# Patient Record
Sex: Female | Born: 1935
Health system: Southern US, Community
[De-identification: ages and names within clinical notes are randomized; demographics above are authoritative.]

## PROBLEM LIST (undated history)

## (undated) DIAGNOSIS — N183 Chronic kidney disease, stage 3 unspecified: Secondary | ICD-10-CM

## (undated) DIAGNOSIS — R011 Cardiac murmur, unspecified: Secondary | ICD-10-CM

## (undated) DIAGNOSIS — M109 Gout, unspecified: Secondary | ICD-10-CM

## (undated) DIAGNOSIS — I219 Acute myocardial infarction, unspecified: Secondary | ICD-10-CM

## (undated) DIAGNOSIS — I10 Essential (primary) hypertension: Secondary | ICD-10-CM

## (undated) DIAGNOSIS — R7303 Prediabetes: Secondary | ICD-10-CM

## (undated) DIAGNOSIS — I779 Disorder of arteries and arterioles, unspecified: Secondary | ICD-10-CM

## (undated) DIAGNOSIS — I2510 Atherosclerotic heart disease of native coronary artery without angina pectoris: Secondary | ICD-10-CM

## (undated) DIAGNOSIS — N390 Urinary tract infection, site not specified: Secondary | ICD-10-CM

## (undated) DIAGNOSIS — M3320 Polymyositis, organ involvement unspecified: Secondary | ICD-10-CM

## (undated) DIAGNOSIS — I739 Peripheral vascular disease, unspecified: Secondary | ICD-10-CM

## (undated) DIAGNOSIS — I350 Nonrheumatic aortic (valve) stenosis: Secondary | ICD-10-CM

## (undated) DIAGNOSIS — E785 Hyperlipidemia, unspecified: Secondary | ICD-10-CM

## (undated) DIAGNOSIS — R413 Other amnesia: Secondary | ICD-10-CM

## (undated) DIAGNOSIS — R911 Solitary pulmonary nodule: Secondary | ICD-10-CM

## (undated) DIAGNOSIS — I609 Nontraumatic subarachnoid hemorrhage, unspecified: Secondary | ICD-10-CM

## (undated) DIAGNOSIS — C50919 Malignant neoplasm of unspecified site of unspecified female breast: Secondary | ICD-10-CM

## (undated) HISTORY — PX: MASTECTOMY, RADICAL: SHX710

## (undated) HISTORY — DX: Malignant neoplasm of unspecified site of unspecified female breast: C50.919

## (undated) HISTORY — PX: CATARACT EXTRACTION: SUR2

## (undated) HISTORY — DX: Gout, unspecified: M10.9

## (undated) HISTORY — DX: Cardiac murmur, unspecified: R01.1

## (undated) HISTORY — DX: Urinary tract infection, site not specified: N39.0

## (undated) HISTORY — DX: Acute myocardial infarction, unspecified: I21.9

## (undated) HISTORY — PX: ABDOMINAL HYSTERECTOMY: SHX81

## (undated) HISTORY — PX: APPENDECTOMY: SHX54

## (undated) HISTORY — DX: Hyperlipidemia, unspecified: E78.5

## (undated) HISTORY — DX: Polymyositis, organ involvement unspecified: M33.20

---

## 2011-08-01 DIAGNOSIS — M069 Rheumatoid arthritis, unspecified: Secondary | ICD-10-CM | POA: Diagnosis not present

## 2011-09-14 DIAGNOSIS — M810 Age-related osteoporosis without current pathological fracture: Secondary | ICD-10-CM | POA: Diagnosis not present

## 2011-11-02 DIAGNOSIS — Z1231 Encounter for screening mammogram for malignant neoplasm of breast: Secondary | ICD-10-CM | POA: Diagnosis not present

## 2011-11-02 DIAGNOSIS — Z853 Personal history of malignant neoplasm of breast: Secondary | ICD-10-CM | POA: Diagnosis not present

## 2011-11-17 DIAGNOSIS — M109 Gout, unspecified: Secondary | ICD-10-CM | POA: Diagnosis not present

## 2011-11-17 DIAGNOSIS — E785 Hyperlipidemia, unspecified: Secondary | ICD-10-CM | POA: Diagnosis not present

## 2011-11-17 DIAGNOSIS — Z79899 Other long term (current) drug therapy: Secondary | ICD-10-CM | POA: Diagnosis not present

## 2011-11-17 DIAGNOSIS — I119 Hypertensive heart disease without heart failure: Secondary | ICD-10-CM | POA: Diagnosis not present

## 2011-11-17 DIAGNOSIS — N19 Unspecified kidney failure: Secondary | ICD-10-CM | POA: Diagnosis not present

## 2011-11-17 DIAGNOSIS — M3320 Polymyositis, organ involvement unspecified: Secondary | ICD-10-CM | POA: Diagnosis not present

## 2011-11-17 DIAGNOSIS — R945 Abnormal results of liver function studies: Secondary | ICD-10-CM | POA: Diagnosis not present

## 2011-11-17 DIAGNOSIS — D539 Nutritional anemia, unspecified: Secondary | ICD-10-CM | POA: Diagnosis not present

## 2011-11-28 DIAGNOSIS — F411 Generalized anxiety disorder: Secondary | ICD-10-CM | POA: Diagnosis not present

## 2011-11-28 DIAGNOSIS — Z7982 Long term (current) use of aspirin: Secondary | ICD-10-CM | POA: Diagnosis not present

## 2011-11-28 DIAGNOSIS — M3320 Polymyositis, organ involvement unspecified: Secondary | ICD-10-CM | POA: Diagnosis not present

## 2011-11-28 DIAGNOSIS — Z5181 Encounter for therapeutic drug level monitoring: Secondary | ICD-10-CM | POA: Diagnosis not present

## 2011-11-28 DIAGNOSIS — M109 Gout, unspecified: Secondary | ICD-10-CM | POA: Diagnosis not present

## 2011-11-28 DIAGNOSIS — R209 Unspecified disturbances of skin sensation: Secondary | ICD-10-CM | POA: Diagnosis not present

## 2011-11-28 DIAGNOSIS — I10 Essential (primary) hypertension: Secondary | ICD-10-CM | POA: Diagnosis not present

## 2011-11-28 DIAGNOSIS — Z79899 Other long term (current) drug therapy: Secondary | ICD-10-CM | POA: Diagnosis not present

## 2011-11-28 DIAGNOSIS — R0602 Shortness of breath: Secondary | ICD-10-CM | POA: Diagnosis not present

## 2011-11-28 DIAGNOSIS — R079 Chest pain, unspecified: Secondary | ICD-10-CM | POA: Diagnosis not present

## 2011-11-28 DIAGNOSIS — R0789 Other chest pain: Secondary | ICD-10-CM | POA: Diagnosis not present

## 2011-11-29 DIAGNOSIS — N19 Unspecified kidney failure: Secondary | ICD-10-CM | POA: Diagnosis not present

## 2011-11-29 DIAGNOSIS — I119 Hypertensive heart disease without heart failure: Secondary | ICD-10-CM | POA: Diagnosis not present

## 2011-11-29 DIAGNOSIS — R0789 Other chest pain: Secondary | ICD-10-CM | POA: Diagnosis not present

## 2011-11-29 DIAGNOSIS — R079 Chest pain, unspecified: Secondary | ICD-10-CM | POA: Diagnosis not present

## 2011-12-12 DIAGNOSIS — I119 Hypertensive heart disease without heart failure: Secondary | ICD-10-CM | POA: Diagnosis not present

## 2012-01-05 DIAGNOSIS — I2510 Atherosclerotic heart disease of native coronary artery without angina pectoris: Secondary | ICD-10-CM | POA: Diagnosis not present

## 2012-01-05 DIAGNOSIS — M109 Gout, unspecified: Secondary | ICD-10-CM | POA: Diagnosis not present

## 2012-01-05 DIAGNOSIS — M810 Age-related osteoporosis without current pathological fracture: Secondary | ICD-10-CM | POA: Diagnosis not present

## 2012-01-05 DIAGNOSIS — I10 Essential (primary) hypertension: Secondary | ICD-10-CM | POA: Diagnosis not present

## 2012-01-19 DIAGNOSIS — I10 Essential (primary) hypertension: Secondary | ICD-10-CM | POA: Diagnosis not present

## 2012-01-19 DIAGNOSIS — R413 Other amnesia: Secondary | ICD-10-CM | POA: Diagnosis not present

## 2012-02-08 DIAGNOSIS — M3320 Polymyositis, organ involvement unspecified: Secondary | ICD-10-CM | POA: Diagnosis not present

## 2012-02-22 DIAGNOSIS — M3320 Polymyositis, organ involvement unspecified: Secondary | ICD-10-CM | POA: Diagnosis not present

## 2012-02-22 DIAGNOSIS — M545 Low back pain: Secondary | ICD-10-CM | POA: Diagnosis not present

## 2012-02-22 DIAGNOSIS — M169 Osteoarthritis of hip, unspecified: Secondary | ICD-10-CM | POA: Diagnosis not present

## 2012-02-22 DIAGNOSIS — M5137 Other intervertebral disc degeneration, lumbosacral region: Secondary | ICD-10-CM | POA: Diagnosis not present

## 2012-02-22 DIAGNOSIS — M109 Gout, unspecified: Secondary | ICD-10-CM | POA: Diagnosis not present

## 2012-03-13 DIAGNOSIS — M5380 Other specified dorsopathies, site unspecified: Secondary | ICD-10-CM | POA: Diagnosis not present

## 2012-03-13 DIAGNOSIS — M545 Low back pain: Secondary | ICD-10-CM | POA: Diagnosis not present

## 2012-03-13 DIAGNOSIS — IMO0002 Reserved for concepts with insufficient information to code with codable children: Secondary | ICD-10-CM | POA: Diagnosis not present

## 2012-03-13 DIAGNOSIS — R293 Abnormal posture: Secondary | ICD-10-CM | POA: Diagnosis not present

## 2012-03-19 DIAGNOSIS — M5380 Other specified dorsopathies, site unspecified: Secondary | ICD-10-CM | POA: Diagnosis not present

## 2012-03-19 DIAGNOSIS — IMO0002 Reserved for concepts with insufficient information to code with codable children: Secondary | ICD-10-CM | POA: Diagnosis not present

## 2012-03-19 DIAGNOSIS — R293 Abnormal posture: Secondary | ICD-10-CM | POA: Diagnosis not present

## 2012-03-19 DIAGNOSIS — M545 Low back pain: Secondary | ICD-10-CM | POA: Diagnosis not present

## 2012-03-20 DIAGNOSIS — M810 Age-related osteoporosis without current pathological fracture: Secondary | ICD-10-CM | POA: Diagnosis not present

## 2012-03-23 DIAGNOSIS — M5380 Other specified dorsopathies, site unspecified: Secondary | ICD-10-CM | POA: Diagnosis not present

## 2012-03-23 DIAGNOSIS — R293 Abnormal posture: Secondary | ICD-10-CM | POA: Diagnosis not present

## 2012-03-23 DIAGNOSIS — IMO0002 Reserved for concepts with insufficient information to code with codable children: Secondary | ICD-10-CM | POA: Diagnosis not present

## 2012-03-23 DIAGNOSIS — M545 Low back pain: Secondary | ICD-10-CM | POA: Diagnosis not present

## 2012-03-30 DIAGNOSIS — R293 Abnormal posture: Secondary | ICD-10-CM | POA: Diagnosis not present

## 2012-03-30 DIAGNOSIS — M5380 Other specified dorsopathies, site unspecified: Secondary | ICD-10-CM | POA: Diagnosis not present

## 2012-03-30 DIAGNOSIS — IMO0002 Reserved for concepts with insufficient information to code with codable children: Secondary | ICD-10-CM | POA: Diagnosis not present

## 2012-03-30 DIAGNOSIS — M545 Low back pain: Secondary | ICD-10-CM | POA: Diagnosis not present

## 2012-04-03 DIAGNOSIS — IMO0002 Reserved for concepts with insufficient information to code with codable children: Secondary | ICD-10-CM | POA: Diagnosis not present

## 2012-04-03 DIAGNOSIS — M545 Low back pain: Secondary | ICD-10-CM | POA: Diagnosis not present

## 2012-04-03 DIAGNOSIS — R293 Abnormal posture: Secondary | ICD-10-CM | POA: Diagnosis not present

## 2012-04-03 DIAGNOSIS — M5380 Other specified dorsopathies, site unspecified: Secondary | ICD-10-CM | POA: Diagnosis not present

## 2012-04-06 DIAGNOSIS — R293 Abnormal posture: Secondary | ICD-10-CM | POA: Diagnosis not present

## 2012-04-06 DIAGNOSIS — M5380 Other specified dorsopathies, site unspecified: Secondary | ICD-10-CM | POA: Diagnosis not present

## 2012-04-06 DIAGNOSIS — IMO0002 Reserved for concepts with insufficient information to code with codable children: Secondary | ICD-10-CM | POA: Diagnosis not present

## 2012-04-06 DIAGNOSIS — M545 Low back pain: Secondary | ICD-10-CM | POA: Diagnosis not present

## 2012-04-09 DIAGNOSIS — R293 Abnormal posture: Secondary | ICD-10-CM | POA: Diagnosis not present

## 2012-04-09 DIAGNOSIS — M545 Low back pain: Secondary | ICD-10-CM | POA: Diagnosis not present

## 2012-04-09 DIAGNOSIS — IMO0002 Reserved for concepts with insufficient information to code with codable children: Secondary | ICD-10-CM | POA: Diagnosis not present

## 2012-04-09 DIAGNOSIS — M5380 Other specified dorsopathies, site unspecified: Secondary | ICD-10-CM | POA: Diagnosis not present

## 2012-04-11 DIAGNOSIS — IMO0002 Reserved for concepts with insufficient information to code with codable children: Secondary | ICD-10-CM | POA: Diagnosis not present

## 2012-04-11 DIAGNOSIS — R293 Abnormal posture: Secondary | ICD-10-CM | POA: Diagnosis not present

## 2012-04-11 DIAGNOSIS — M545 Low back pain: Secondary | ICD-10-CM | POA: Diagnosis not present

## 2012-04-11 DIAGNOSIS — M5380 Other specified dorsopathies, site unspecified: Secondary | ICD-10-CM | POA: Diagnosis not present

## 2012-04-17 DIAGNOSIS — M5380 Other specified dorsopathies, site unspecified: Secondary | ICD-10-CM | POA: Diagnosis not present

## 2012-04-17 DIAGNOSIS — M545 Low back pain: Secondary | ICD-10-CM | POA: Diagnosis not present

## 2012-04-17 DIAGNOSIS — IMO0002 Reserved for concepts with insufficient information to code with codable children: Secondary | ICD-10-CM | POA: Diagnosis not present

## 2012-04-17 DIAGNOSIS — R293 Abnormal posture: Secondary | ICD-10-CM | POA: Diagnosis not present

## 2012-04-20 DIAGNOSIS — R293 Abnormal posture: Secondary | ICD-10-CM | POA: Diagnosis not present

## 2012-04-20 DIAGNOSIS — M5380 Other specified dorsopathies, site unspecified: Secondary | ICD-10-CM | POA: Diagnosis not present

## 2012-04-20 DIAGNOSIS — M545 Low back pain: Secondary | ICD-10-CM | POA: Diagnosis not present

## 2012-04-20 DIAGNOSIS — IMO0002 Reserved for concepts with insufficient information to code with codable children: Secondary | ICD-10-CM | POA: Diagnosis not present

## 2012-04-24 DIAGNOSIS — M5380 Other specified dorsopathies, site unspecified: Secondary | ICD-10-CM | POA: Diagnosis not present

## 2012-04-24 DIAGNOSIS — M545 Low back pain: Secondary | ICD-10-CM | POA: Diagnosis not present

## 2012-04-24 DIAGNOSIS — R293 Abnormal posture: Secondary | ICD-10-CM | POA: Diagnosis not present

## 2012-04-24 DIAGNOSIS — IMO0002 Reserved for concepts with insufficient information to code with codable children: Secondary | ICD-10-CM | POA: Diagnosis not present

## 2012-04-30 DIAGNOSIS — R293 Abnormal posture: Secondary | ICD-10-CM | POA: Diagnosis not present

## 2012-04-30 DIAGNOSIS — IMO0002 Reserved for concepts with insufficient information to code with codable children: Secondary | ICD-10-CM | POA: Diagnosis not present

## 2012-04-30 DIAGNOSIS — M545 Low back pain: Secondary | ICD-10-CM | POA: Diagnosis not present

## 2012-04-30 DIAGNOSIS — M5380 Other specified dorsopathies, site unspecified: Secondary | ICD-10-CM | POA: Diagnosis not present

## 2012-05-11 DIAGNOSIS — R293 Abnormal posture: Secondary | ICD-10-CM | POA: Diagnosis not present

## 2012-05-11 DIAGNOSIS — M545 Low back pain: Secondary | ICD-10-CM | POA: Diagnosis not present

## 2012-05-11 DIAGNOSIS — IMO0002 Reserved for concepts with insufficient information to code with codable children: Secondary | ICD-10-CM | POA: Diagnosis not present

## 2012-05-11 DIAGNOSIS — M5380 Other specified dorsopathies, site unspecified: Secondary | ICD-10-CM | POA: Diagnosis not present

## 2012-05-17 DIAGNOSIS — R413 Other amnesia: Secondary | ICD-10-CM | POA: Diagnosis not present

## 2012-05-17 DIAGNOSIS — I10 Essential (primary) hypertension: Secondary | ICD-10-CM | POA: Diagnosis not present

## 2012-05-23 DIAGNOSIS — I10 Essential (primary) hypertension: Secondary | ICD-10-CM | POA: Diagnosis not present

## 2012-05-23 DIAGNOSIS — M109 Gout, unspecified: Secondary | ICD-10-CM | POA: Diagnosis not present

## 2012-05-23 DIAGNOSIS — Z23 Encounter for immunization: Secondary | ICD-10-CM | POA: Diagnosis not present

## 2012-05-23 DIAGNOSIS — Z853 Personal history of malignant neoplasm of breast: Secondary | ICD-10-CM | POA: Diagnosis not present

## 2012-05-23 DIAGNOSIS — Z0000 Encounter for general adult medical examination without abnormal findings: Secondary | ICD-10-CM | POA: Diagnosis not present

## 2012-05-23 DIAGNOSIS — I2510 Atherosclerotic heart disease of native coronary artery without angina pectoris: Secondary | ICD-10-CM | POA: Diagnosis not present

## 2012-05-30 DIAGNOSIS — M810 Age-related osteoporosis without current pathological fracture: Secondary | ICD-10-CM | POA: Diagnosis not present

## 2012-06-07 DIAGNOSIS — J209 Acute bronchitis, unspecified: Secondary | ICD-10-CM | POA: Diagnosis not present

## 2012-06-25 DIAGNOSIS — J3489 Other specified disorders of nose and nasal sinuses: Secondary | ICD-10-CM | POA: Diagnosis not present

## 2012-06-25 DIAGNOSIS — R05 Cough: Secondary | ICD-10-CM | POA: Diagnosis not present

## 2012-06-25 DIAGNOSIS — I959 Hypotension, unspecified: Secondary | ICD-10-CM | POA: Diagnosis not present

## 2012-08-21 DIAGNOSIS — E785 Hyperlipidemia, unspecified: Secondary | ICD-10-CM | POA: Diagnosis not present

## 2012-08-21 DIAGNOSIS — R7301 Impaired fasting glucose: Secondary | ICD-10-CM | POA: Diagnosis not present

## 2012-08-21 DIAGNOSIS — I10 Essential (primary) hypertension: Secondary | ICD-10-CM | POA: Diagnosis not present

## 2012-08-30 DIAGNOSIS — R7301 Impaired fasting glucose: Secondary | ICD-10-CM | POA: Diagnosis not present

## 2012-08-30 DIAGNOSIS — E785 Hyperlipidemia, unspecified: Secondary | ICD-10-CM | POA: Diagnosis not present

## 2012-08-30 DIAGNOSIS — R51 Headache: Secondary | ICD-10-CM | POA: Diagnosis not present

## 2012-08-30 DIAGNOSIS — I10 Essential (primary) hypertension: Secondary | ICD-10-CM | POA: Diagnosis not present

## 2012-09-03 DIAGNOSIS — R42 Dizziness and giddiness: Secondary | ICD-10-CM | POA: Diagnosis not present

## 2012-09-03 DIAGNOSIS — I6789 Other cerebrovascular disease: Secondary | ICD-10-CM | POA: Diagnosis not present

## 2012-11-27 DIAGNOSIS — Z853 Personal history of malignant neoplasm of breast: Secondary | ICD-10-CM | POA: Diagnosis not present

## 2012-11-27 DIAGNOSIS — Z1231 Encounter for screening mammogram for malignant neoplasm of breast: Secondary | ICD-10-CM | POA: Diagnosis not present

## 2012-11-28 DIAGNOSIS — Z79899 Other long term (current) drug therapy: Secondary | ICD-10-CM | POA: Diagnosis not present

## 2012-11-28 DIAGNOSIS — M3390 Dermatopolymyositis, unspecified, organ involvement unspecified: Secondary | ICD-10-CM | POA: Diagnosis not present

## 2012-11-28 DIAGNOSIS — M3320 Polymyositis, organ involvement unspecified: Secondary | ICD-10-CM | POA: Diagnosis not present

## 2012-11-28 DIAGNOSIS — M810 Age-related osteoporosis without current pathological fracture: Secondary | ICD-10-CM | POA: Diagnosis not present

## 2012-12-26 DIAGNOSIS — I10 Essential (primary) hypertension: Secondary | ICD-10-CM | POA: Diagnosis not present

## 2012-12-31 DIAGNOSIS — Z79899 Other long term (current) drug therapy: Secondary | ICD-10-CM | POA: Diagnosis not present

## 2012-12-31 DIAGNOSIS — I10 Essential (primary) hypertension: Secondary | ICD-10-CM | POA: Diagnosis not present

## 2012-12-31 DIAGNOSIS — E785 Hyperlipidemia, unspecified: Secondary | ICD-10-CM | POA: Diagnosis not present

## 2012-12-31 DIAGNOSIS — R7301 Impaired fasting glucose: Secondary | ICD-10-CM | POA: Diagnosis not present

## 2013-01-29 DIAGNOSIS — I10 Essential (primary) hypertension: Secondary | ICD-10-CM | POA: Diagnosis not present

## 2013-01-29 DIAGNOSIS — R079 Chest pain, unspecified: Secondary | ICD-10-CM | POA: Diagnosis not present

## 2013-02-27 DIAGNOSIS — Z79899 Other long term (current) drug therapy: Secondary | ICD-10-CM | POA: Diagnosis not present

## 2013-02-27 DIAGNOSIS — M3390 Dermatopolymyositis, unspecified, organ involvement unspecified: Secondary | ICD-10-CM | POA: Diagnosis not present

## 2013-04-22 DIAGNOSIS — Z79899 Other long term (current) drug therapy: Secondary | ICD-10-CM | POA: Diagnosis not present

## 2013-04-22 DIAGNOSIS — S63006A Unspecified dislocation of unspecified wrist and hand, initial encounter: Secondary | ICD-10-CM | POA: Diagnosis not present

## 2013-04-22 DIAGNOSIS — W010XXA Fall on same level from slipping, tripping and stumbling without subsequent striking against object, initial encounter: Secondary | ICD-10-CM | POA: Diagnosis not present

## 2013-04-22 DIAGNOSIS — I519 Heart disease, unspecified: Secondary | ICD-10-CM | POA: Diagnosis not present

## 2013-04-22 DIAGNOSIS — Z9861 Coronary angioplasty status: Secondary | ICD-10-CM | POA: Diagnosis not present

## 2013-04-22 DIAGNOSIS — I10 Essential (primary) hypertension: Secondary | ICD-10-CM | POA: Diagnosis not present

## 2013-04-22 DIAGNOSIS — I6350 Cerebral infarction due to unspecified occlusion or stenosis of unspecified cerebral artery: Secondary | ICD-10-CM | POA: Diagnosis not present

## 2013-04-22 DIAGNOSIS — R42 Dizziness and giddiness: Secondary | ICD-10-CM | POA: Diagnosis not present

## 2013-04-22 DIAGNOSIS — S59909A Unspecified injury of unspecified elbow, initial encounter: Secondary | ICD-10-CM | POA: Diagnosis not present

## 2013-04-22 DIAGNOSIS — Z9010 Acquired absence of unspecified breast and nipple: Secondary | ICD-10-CM | POA: Diagnosis not present

## 2013-04-22 DIAGNOSIS — Z853 Personal history of malignant neoplasm of breast: Secondary | ICD-10-CM | POA: Diagnosis not present

## 2013-04-22 DIAGNOSIS — M25539 Pain in unspecified wrist: Secondary | ICD-10-CM | POA: Diagnosis not present

## 2013-04-22 DIAGNOSIS — M25559 Pain in unspecified hip: Secondary | ICD-10-CM | POA: Diagnosis not present

## 2013-04-22 DIAGNOSIS — Z7982 Long term (current) use of aspirin: Secondary | ICD-10-CM | POA: Diagnosis not present

## 2013-04-29 DIAGNOSIS — M25539 Pain in unspecified wrist: Secondary | ICD-10-CM | POA: Diagnosis not present

## 2013-05-09 DIAGNOSIS — M25539 Pain in unspecified wrist: Secondary | ICD-10-CM | POA: Diagnosis not present

## 2013-05-15 DIAGNOSIS — Z01810 Encounter for preprocedural cardiovascular examination: Secondary | ICD-10-CM | POA: Diagnosis not present

## 2013-05-15 DIAGNOSIS — I10 Essential (primary) hypertension: Secondary | ICD-10-CM | POA: Diagnosis not present

## 2013-05-15 DIAGNOSIS — E785 Hyperlipidemia, unspecified: Secondary | ICD-10-CM | POA: Diagnosis not present

## 2013-05-15 DIAGNOSIS — I2510 Atherosclerotic heart disease of native coronary artery without angina pectoris: Secondary | ICD-10-CM | POA: Diagnosis not present

## 2013-05-17 DIAGNOSIS — S63519A Sprain of carpal joint of unspecified wrist, initial encounter: Secondary | ICD-10-CM | POA: Diagnosis not present

## 2013-05-17 DIAGNOSIS — G8918 Other acute postprocedural pain: Secondary | ICD-10-CM | POA: Diagnosis not present

## 2013-05-17 DIAGNOSIS — S63599A Other specified sprain of unspecified wrist, initial encounter: Secondary | ICD-10-CM | POA: Diagnosis not present

## 2013-05-17 DIAGNOSIS — M25539 Pain in unspecified wrist: Secondary | ICD-10-CM | POA: Diagnosis not present

## 2013-05-27 DIAGNOSIS — S63519A Sprain of carpal joint of unspecified wrist, initial encounter: Secondary | ICD-10-CM | POA: Diagnosis not present

## 2013-06-06 DIAGNOSIS — M3320 Polymyositis, organ involvement unspecified: Secondary | ICD-10-CM | POA: Diagnosis not present

## 2013-06-06 DIAGNOSIS — Z79899 Other long term (current) drug therapy: Secondary | ICD-10-CM | POA: Diagnosis not present

## 2013-06-07 DIAGNOSIS — Z23 Encounter for immunization: Secondary | ICD-10-CM | POA: Diagnosis not present

## 2013-06-12 DIAGNOSIS — M3320 Polymyositis, organ involvement unspecified: Secondary | ICD-10-CM | POA: Diagnosis not present

## 2013-06-12 DIAGNOSIS — R269 Unspecified abnormalities of gait and mobility: Secondary | ICD-10-CM | POA: Diagnosis not present

## 2013-06-12 DIAGNOSIS — M4120 Other idiopathic scoliosis, site unspecified: Secondary | ICD-10-CM | POA: Diagnosis not present

## 2013-06-12 DIAGNOSIS — Z9181 History of falling: Secondary | ICD-10-CM | POA: Diagnosis not present

## 2013-06-12 DIAGNOSIS — M6281 Muscle weakness (generalized): Secondary | ICD-10-CM | POA: Diagnosis not present

## 2013-06-19 DIAGNOSIS — M4120 Other idiopathic scoliosis, site unspecified: Secondary | ICD-10-CM | POA: Diagnosis not present

## 2013-06-19 DIAGNOSIS — R269 Unspecified abnormalities of gait and mobility: Secondary | ICD-10-CM | POA: Diagnosis not present

## 2013-06-19 DIAGNOSIS — M3320 Polymyositis, organ involvement unspecified: Secondary | ICD-10-CM | POA: Diagnosis not present

## 2013-06-19 DIAGNOSIS — Z9181 History of falling: Secondary | ICD-10-CM | POA: Diagnosis not present

## 2013-06-19 DIAGNOSIS — M6281 Muscle weakness (generalized): Secondary | ICD-10-CM | POA: Diagnosis not present

## 2013-06-25 DIAGNOSIS — Z9181 History of falling: Secondary | ICD-10-CM | POA: Diagnosis not present

## 2013-06-25 DIAGNOSIS — M3320 Polymyositis, organ involvement unspecified: Secondary | ICD-10-CM | POA: Diagnosis not present

## 2013-06-25 DIAGNOSIS — M6281 Muscle weakness (generalized): Secondary | ICD-10-CM | POA: Diagnosis not present

## 2013-06-25 DIAGNOSIS — R269 Unspecified abnormalities of gait and mobility: Secondary | ICD-10-CM | POA: Diagnosis not present

## 2013-06-25 DIAGNOSIS — M4120 Other idiopathic scoliosis, site unspecified: Secondary | ICD-10-CM | POA: Diagnosis not present

## 2013-06-27 DIAGNOSIS — M3320 Polymyositis, organ involvement unspecified: Secondary | ICD-10-CM | POA: Diagnosis not present

## 2013-06-27 DIAGNOSIS — R269 Unspecified abnormalities of gait and mobility: Secondary | ICD-10-CM | POA: Diagnosis not present

## 2013-06-27 DIAGNOSIS — M4120 Other idiopathic scoliosis, site unspecified: Secondary | ICD-10-CM | POA: Diagnosis not present

## 2013-06-27 DIAGNOSIS — M6281 Muscle weakness (generalized): Secondary | ICD-10-CM | POA: Diagnosis not present

## 2013-06-27 DIAGNOSIS — Z9181 History of falling: Secondary | ICD-10-CM | POA: Diagnosis not present

## 2013-07-02 DIAGNOSIS — R269 Unspecified abnormalities of gait and mobility: Secondary | ICD-10-CM | POA: Diagnosis not present

## 2013-07-02 DIAGNOSIS — M3320 Polymyositis, organ involvement unspecified: Secondary | ICD-10-CM | POA: Diagnosis not present

## 2013-07-02 DIAGNOSIS — M4120 Other idiopathic scoliosis, site unspecified: Secondary | ICD-10-CM | POA: Diagnosis not present

## 2013-07-02 DIAGNOSIS — Z9181 History of falling: Secondary | ICD-10-CM | POA: Diagnosis not present

## 2013-07-02 DIAGNOSIS — M6281 Muscle weakness (generalized): Secondary | ICD-10-CM | POA: Diagnosis not present

## 2013-07-04 DIAGNOSIS — M6281 Muscle weakness (generalized): Secondary | ICD-10-CM | POA: Diagnosis not present

## 2013-07-04 DIAGNOSIS — R269 Unspecified abnormalities of gait and mobility: Secondary | ICD-10-CM | POA: Diagnosis not present

## 2013-07-04 DIAGNOSIS — M3320 Polymyositis, organ involvement unspecified: Secondary | ICD-10-CM | POA: Diagnosis not present

## 2013-07-04 DIAGNOSIS — Z9181 History of falling: Secondary | ICD-10-CM | POA: Diagnosis not present

## 2013-07-04 DIAGNOSIS — M4120 Other idiopathic scoliosis, site unspecified: Secondary | ICD-10-CM | POA: Diagnosis not present

## 2013-07-09 DIAGNOSIS — R269 Unspecified abnormalities of gait and mobility: Secondary | ICD-10-CM | POA: Diagnosis not present

## 2013-07-09 DIAGNOSIS — M3320 Polymyositis, organ involvement unspecified: Secondary | ICD-10-CM | POA: Diagnosis not present

## 2013-07-09 DIAGNOSIS — M6281 Muscle weakness (generalized): Secondary | ICD-10-CM | POA: Diagnosis not present

## 2013-07-09 DIAGNOSIS — M4120 Other idiopathic scoliosis, site unspecified: Secondary | ICD-10-CM | POA: Diagnosis not present

## 2013-07-09 DIAGNOSIS — Z9181 History of falling: Secondary | ICD-10-CM | POA: Diagnosis not present

## 2013-07-11 DIAGNOSIS — Z472 Encounter for removal of internal fixation device: Secondary | ICD-10-CM | POA: Diagnosis not present

## 2013-07-11 DIAGNOSIS — S63519A Sprain of carpal joint of unspecified wrist, initial encounter: Secondary | ICD-10-CM | POA: Diagnosis not present

## 2013-07-30 DIAGNOSIS — R269 Unspecified abnormalities of gait and mobility: Secondary | ICD-10-CM | POA: Diagnosis not present

## 2013-07-30 DIAGNOSIS — M4120 Other idiopathic scoliosis, site unspecified: Secondary | ICD-10-CM | POA: Diagnosis not present

## 2013-07-30 DIAGNOSIS — Z9181 History of falling: Secondary | ICD-10-CM | POA: Diagnosis not present

## 2013-07-30 DIAGNOSIS — M6281 Muscle weakness (generalized): Secondary | ICD-10-CM | POA: Diagnosis not present

## 2013-07-30 DIAGNOSIS — M3320 Polymyositis, organ involvement unspecified: Secondary | ICD-10-CM | POA: Diagnosis not present

## 2013-08-05 DIAGNOSIS — R011 Cardiac murmur, unspecified: Secondary | ICD-10-CM | POA: Diagnosis not present

## 2013-08-05 DIAGNOSIS — I2510 Atherosclerotic heart disease of native coronary artery without angina pectoris: Secondary | ICD-10-CM | POA: Diagnosis not present

## 2013-08-06 DIAGNOSIS — M6281 Muscle weakness (generalized): Secondary | ICD-10-CM | POA: Diagnosis not present

## 2013-08-06 DIAGNOSIS — R269 Unspecified abnormalities of gait and mobility: Secondary | ICD-10-CM | POA: Diagnosis not present

## 2013-08-06 DIAGNOSIS — M25639 Stiffness of unspecified wrist, not elsewhere classified: Secondary | ICD-10-CM | POA: Diagnosis not present

## 2013-08-06 DIAGNOSIS — M3320 Polymyositis, organ involvement unspecified: Secondary | ICD-10-CM | POA: Diagnosis not present

## 2013-08-06 DIAGNOSIS — M25539 Pain in unspecified wrist: Secondary | ICD-10-CM | POA: Diagnosis not present

## 2013-08-06 DIAGNOSIS — M4120 Other idiopathic scoliosis, site unspecified: Secondary | ICD-10-CM | POA: Diagnosis not present

## 2013-08-06 DIAGNOSIS — Z9181 History of falling: Secondary | ICD-10-CM | POA: Diagnosis not present

## 2013-08-08 DIAGNOSIS — R269 Unspecified abnormalities of gait and mobility: Secondary | ICD-10-CM | POA: Diagnosis not present

## 2013-08-08 DIAGNOSIS — Z9181 History of falling: Secondary | ICD-10-CM | POA: Diagnosis not present

## 2013-08-08 DIAGNOSIS — M25539 Pain in unspecified wrist: Secondary | ICD-10-CM | POA: Diagnosis not present

## 2013-08-08 DIAGNOSIS — M109 Gout, unspecified: Secondary | ICD-10-CM | POA: Diagnosis not present

## 2013-08-08 DIAGNOSIS — Z79899 Other long term (current) drug therapy: Secondary | ICD-10-CM | POA: Diagnosis not present

## 2013-08-08 DIAGNOSIS — M6281 Muscle weakness (generalized): Secondary | ICD-10-CM | POA: Diagnosis not present

## 2013-08-08 DIAGNOSIS — M3320 Polymyositis, organ involvement unspecified: Secondary | ICD-10-CM | POA: Diagnosis not present

## 2013-08-08 DIAGNOSIS — M4120 Other idiopathic scoliosis, site unspecified: Secondary | ICD-10-CM | POA: Diagnosis not present

## 2013-08-08 DIAGNOSIS — E785 Hyperlipidemia, unspecified: Secondary | ICD-10-CM | POA: Diagnosis not present

## 2013-08-08 DIAGNOSIS — I10 Essential (primary) hypertension: Secondary | ICD-10-CM | POA: Diagnosis not present

## 2013-08-08 DIAGNOSIS — M25639 Stiffness of unspecified wrist, not elsewhere classified: Secondary | ICD-10-CM | POA: Diagnosis not present

## 2013-08-08 DIAGNOSIS — R7301 Impaired fasting glucose: Secondary | ICD-10-CM | POA: Diagnosis not present

## 2013-08-13 DIAGNOSIS — M3320 Polymyositis, organ involvement unspecified: Secondary | ICD-10-CM | POA: Diagnosis not present

## 2013-08-13 DIAGNOSIS — M6281 Muscle weakness (generalized): Secondary | ICD-10-CM | POA: Diagnosis not present

## 2013-08-13 DIAGNOSIS — M25539 Pain in unspecified wrist: Secondary | ICD-10-CM | POA: Diagnosis not present

## 2013-08-13 DIAGNOSIS — Z9181 History of falling: Secondary | ICD-10-CM | POA: Diagnosis not present

## 2013-08-13 DIAGNOSIS — M4120 Other idiopathic scoliosis, site unspecified: Secondary | ICD-10-CM | POA: Diagnosis not present

## 2013-08-13 DIAGNOSIS — M25639 Stiffness of unspecified wrist, not elsewhere classified: Secondary | ICD-10-CM | POA: Diagnosis not present

## 2013-08-13 DIAGNOSIS — R269 Unspecified abnormalities of gait and mobility: Secondary | ICD-10-CM | POA: Diagnosis not present

## 2013-08-14 DIAGNOSIS — R7301 Impaired fasting glucose: Secondary | ICD-10-CM | POA: Diagnosis not present

## 2013-08-14 DIAGNOSIS — Z23 Encounter for immunization: Secondary | ICD-10-CM | POA: Diagnosis not present

## 2013-08-14 DIAGNOSIS — E785 Hyperlipidemia, unspecified: Secondary | ICD-10-CM | POA: Diagnosis not present

## 2013-08-14 DIAGNOSIS — I10 Essential (primary) hypertension: Secondary | ICD-10-CM | POA: Diagnosis not present

## 2013-08-14 DIAGNOSIS — Z0000 Encounter for general adult medical examination without abnormal findings: Secondary | ICD-10-CM | POA: Diagnosis not present

## 2013-08-14 DIAGNOSIS — I2510 Atherosclerotic heart disease of native coronary artery without angina pectoris: Secondary | ICD-10-CM | POA: Diagnosis not present

## 2013-08-14 DIAGNOSIS — M810 Age-related osteoporosis without current pathological fracture: Secondary | ICD-10-CM | POA: Diagnosis not present

## 2013-08-15 DIAGNOSIS — M25539 Pain in unspecified wrist: Secondary | ICD-10-CM | POA: Diagnosis not present

## 2013-08-15 DIAGNOSIS — M6281 Muscle weakness (generalized): Secondary | ICD-10-CM | POA: Diagnosis not present

## 2013-08-15 DIAGNOSIS — R269 Unspecified abnormalities of gait and mobility: Secondary | ICD-10-CM | POA: Diagnosis not present

## 2013-08-15 DIAGNOSIS — M4120 Other idiopathic scoliosis, site unspecified: Secondary | ICD-10-CM | POA: Diagnosis not present

## 2013-08-15 DIAGNOSIS — M3320 Polymyositis, organ involvement unspecified: Secondary | ICD-10-CM | POA: Diagnosis not present

## 2013-08-15 DIAGNOSIS — M25639 Stiffness of unspecified wrist, not elsewhere classified: Secondary | ICD-10-CM | POA: Diagnosis not present

## 2013-08-15 DIAGNOSIS — Z9181 History of falling: Secondary | ICD-10-CM | POA: Diagnosis not present

## 2013-08-22 DIAGNOSIS — M4120 Other idiopathic scoliosis, site unspecified: Secondary | ICD-10-CM | POA: Diagnosis not present

## 2013-08-22 DIAGNOSIS — Z9181 History of falling: Secondary | ICD-10-CM | POA: Diagnosis not present

## 2013-08-22 DIAGNOSIS — R269 Unspecified abnormalities of gait and mobility: Secondary | ICD-10-CM | POA: Diagnosis not present

## 2013-08-22 DIAGNOSIS — M6281 Muscle weakness (generalized): Secondary | ICD-10-CM | POA: Diagnosis not present

## 2013-08-22 DIAGNOSIS — M25639 Stiffness of unspecified wrist, not elsewhere classified: Secondary | ICD-10-CM | POA: Diagnosis not present

## 2013-08-22 DIAGNOSIS — M3320 Polymyositis, organ involvement unspecified: Secondary | ICD-10-CM | POA: Diagnosis not present

## 2013-08-22 DIAGNOSIS — M25539 Pain in unspecified wrist: Secondary | ICD-10-CM | POA: Diagnosis not present

## 2013-08-27 DIAGNOSIS — M6281 Muscle weakness (generalized): Secondary | ICD-10-CM | POA: Diagnosis not present

## 2013-08-27 DIAGNOSIS — Z9181 History of falling: Secondary | ICD-10-CM | POA: Diagnosis not present

## 2013-08-27 DIAGNOSIS — M4120 Other idiopathic scoliosis, site unspecified: Secondary | ICD-10-CM | POA: Diagnosis not present

## 2013-08-27 DIAGNOSIS — M25539 Pain in unspecified wrist: Secondary | ICD-10-CM | POA: Diagnosis not present

## 2013-08-27 DIAGNOSIS — M25639 Stiffness of unspecified wrist, not elsewhere classified: Secondary | ICD-10-CM | POA: Diagnosis not present

## 2013-08-27 DIAGNOSIS — M3320 Polymyositis, organ involvement unspecified: Secondary | ICD-10-CM | POA: Diagnosis not present

## 2013-08-27 DIAGNOSIS — R269 Unspecified abnormalities of gait and mobility: Secondary | ICD-10-CM | POA: Diagnosis not present

## 2013-08-29 DIAGNOSIS — M25539 Pain in unspecified wrist: Secondary | ICD-10-CM | POA: Diagnosis not present

## 2013-08-29 DIAGNOSIS — M25639 Stiffness of unspecified wrist, not elsewhere classified: Secondary | ICD-10-CM | POA: Diagnosis not present

## 2013-08-29 DIAGNOSIS — M3320 Polymyositis, organ involvement unspecified: Secondary | ICD-10-CM | POA: Diagnosis not present

## 2013-08-29 DIAGNOSIS — M4120 Other idiopathic scoliosis, site unspecified: Secondary | ICD-10-CM | POA: Diagnosis not present

## 2013-08-29 DIAGNOSIS — R269 Unspecified abnormalities of gait and mobility: Secondary | ICD-10-CM | POA: Diagnosis not present

## 2013-08-29 DIAGNOSIS — Z9181 History of falling: Secondary | ICD-10-CM | POA: Diagnosis not present

## 2013-08-29 DIAGNOSIS — M6281 Muscle weakness (generalized): Secondary | ICD-10-CM | POA: Diagnosis not present

## 2013-09-02 DIAGNOSIS — S63519A Sprain of carpal joint of unspecified wrist, initial encounter: Secondary | ICD-10-CM | POA: Diagnosis not present

## 2013-09-03 DIAGNOSIS — M25639 Stiffness of unspecified wrist, not elsewhere classified: Secondary | ICD-10-CM | POA: Diagnosis not present

## 2013-09-03 DIAGNOSIS — R269 Unspecified abnormalities of gait and mobility: Secondary | ICD-10-CM | POA: Diagnosis not present

## 2013-09-03 DIAGNOSIS — M25539 Pain in unspecified wrist: Secondary | ICD-10-CM | POA: Diagnosis not present

## 2013-09-03 DIAGNOSIS — Z9181 History of falling: Secondary | ICD-10-CM | POA: Diagnosis not present

## 2013-09-03 DIAGNOSIS — M4120 Other idiopathic scoliosis, site unspecified: Secondary | ICD-10-CM | POA: Diagnosis not present

## 2013-09-03 DIAGNOSIS — M6281 Muscle weakness (generalized): Secondary | ICD-10-CM | POA: Diagnosis not present

## 2013-09-03 DIAGNOSIS — M3320 Polymyositis, organ involvement unspecified: Secondary | ICD-10-CM | POA: Diagnosis not present

## 2013-09-05 DIAGNOSIS — M3320 Polymyositis, organ involvement unspecified: Secondary | ICD-10-CM | POA: Diagnosis not present

## 2013-10-02 DIAGNOSIS — M6281 Muscle weakness (generalized): Secondary | ICD-10-CM | POA: Diagnosis not present

## 2013-10-02 DIAGNOSIS — Z9181 History of falling: Secondary | ICD-10-CM | POA: Diagnosis not present

## 2013-10-02 DIAGNOSIS — M4120 Other idiopathic scoliosis, site unspecified: Secondary | ICD-10-CM | POA: Diagnosis not present

## 2013-10-02 DIAGNOSIS — M25539 Pain in unspecified wrist: Secondary | ICD-10-CM | POA: Diagnosis not present

## 2013-10-02 DIAGNOSIS — M3320 Polymyositis, organ involvement unspecified: Secondary | ICD-10-CM | POA: Diagnosis not present

## 2013-10-02 DIAGNOSIS — R269 Unspecified abnormalities of gait and mobility: Secondary | ICD-10-CM | POA: Diagnosis not present

## 2013-10-02 DIAGNOSIS — M25639 Stiffness of unspecified wrist, not elsewhere classified: Secondary | ICD-10-CM | POA: Diagnosis not present

## 2013-10-16 DIAGNOSIS — M4120 Other idiopathic scoliosis, site unspecified: Secondary | ICD-10-CM | POA: Diagnosis not present

## 2013-10-16 DIAGNOSIS — M6281 Muscle weakness (generalized): Secondary | ICD-10-CM | POA: Diagnosis not present

## 2013-10-16 DIAGNOSIS — M3320 Polymyositis, organ involvement unspecified: Secondary | ICD-10-CM | POA: Diagnosis not present

## 2013-10-16 DIAGNOSIS — R269 Unspecified abnormalities of gait and mobility: Secondary | ICD-10-CM | POA: Diagnosis not present

## 2013-10-16 DIAGNOSIS — Z9181 History of falling: Secondary | ICD-10-CM | POA: Diagnosis not present

## 2013-10-16 DIAGNOSIS — M25639 Stiffness of unspecified wrist, not elsewhere classified: Secondary | ICD-10-CM | POA: Diagnosis not present

## 2013-10-16 DIAGNOSIS — M25539 Pain in unspecified wrist: Secondary | ICD-10-CM | POA: Diagnosis not present

## 2013-10-31 DIAGNOSIS — R079 Chest pain, unspecified: Secondary | ICD-10-CM | POA: Diagnosis not present

## 2013-10-31 DIAGNOSIS — I200 Unstable angina: Secondary | ICD-10-CM | POA: Diagnosis not present

## 2013-10-31 DIAGNOSIS — I10 Essential (primary) hypertension: Secondary | ICD-10-CM | POA: Diagnosis not present

## 2013-11-02 DIAGNOSIS — M3320 Polymyositis, organ involvement unspecified: Secondary | ICD-10-CM | POA: Diagnosis present

## 2013-11-02 DIAGNOSIS — I2510 Atherosclerotic heart disease of native coronary artery without angina pectoris: Secondary | ICD-10-CM | POA: Diagnosis present

## 2013-11-02 DIAGNOSIS — IMO0001 Reserved for inherently not codable concepts without codable children: Secondary | ICD-10-CM | POA: Diagnosis not present

## 2013-11-02 DIAGNOSIS — I214 Non-ST elevation (NSTEMI) myocardial infarction: Secondary | ICD-10-CM | POA: Diagnosis not present

## 2013-11-02 DIAGNOSIS — I259 Chronic ischemic heart disease, unspecified: Secondary | ICD-10-CM | POA: Diagnosis not present

## 2013-11-02 DIAGNOSIS — I209 Angina pectoris, unspecified: Secondary | ICD-10-CM | POA: Diagnosis present

## 2013-11-02 DIAGNOSIS — I7589 Atheroembolism of other site: Secondary | ICD-10-CM | POA: Diagnosis not present

## 2013-11-02 DIAGNOSIS — E785 Hyperlipidemia, unspecified: Secondary | ICD-10-CM | POA: Diagnosis not present

## 2013-11-02 DIAGNOSIS — N181 Chronic kidney disease, stage 1: Secondary | ICD-10-CM | POA: Diagnosis not present

## 2013-11-02 DIAGNOSIS — M109 Gout, unspecified: Secondary | ICD-10-CM | POA: Diagnosis present

## 2013-11-02 DIAGNOSIS — R279 Unspecified lack of coordination: Secondary | ICD-10-CM | POA: Diagnosis present

## 2013-11-02 DIAGNOSIS — R42 Dizziness and giddiness: Secondary | ICD-10-CM | POA: Diagnosis not present

## 2013-11-02 DIAGNOSIS — R55 Syncope and collapse: Secondary | ICD-10-CM | POA: Diagnosis present

## 2013-11-02 DIAGNOSIS — N189 Chronic kidney disease, unspecified: Secondary | ICD-10-CM | POA: Diagnosis present

## 2013-11-02 DIAGNOSIS — C50919 Malignant neoplasm of unspecified site of unspecified female breast: Secondary | ICD-10-CM | POA: Diagnosis not present

## 2013-11-02 DIAGNOSIS — I129 Hypertensive chronic kidney disease with stage 1 through stage 4 chronic kidney disease, or unspecified chronic kidney disease: Secondary | ICD-10-CM | POA: Diagnosis not present

## 2013-11-02 DIAGNOSIS — Z9861 Coronary angioplasty status: Secondary | ICD-10-CM | POA: Diagnosis not present

## 2013-11-15 DIAGNOSIS — E785 Hyperlipidemia, unspecified: Secondary | ICD-10-CM | POA: Diagnosis not present

## 2013-11-15 DIAGNOSIS — I7589 Atheroembolism of other site: Secondary | ICD-10-CM | POA: Diagnosis not present

## 2013-11-15 DIAGNOSIS — I259 Chronic ischemic heart disease, unspecified: Secondary | ICD-10-CM | POA: Diagnosis not present

## 2013-12-09 ENCOUNTER — Ambulatory Visit (INDEPENDENT_AMBULATORY_CARE_PROVIDER_SITE_OTHER): Payer: Medicare Other | Admitting: Nurse Practitioner

## 2013-12-09 ENCOUNTER — Encounter: Payer: Self-pay | Admitting: Nurse Practitioner

## 2013-12-09 VITALS — BP 118/58 | HR 57 | Temp 98.0°F | Resp 18 | Ht 66.0 in | Wt 136.0 lb

## 2013-12-09 DIAGNOSIS — M109 Gout, unspecified: Secondary | ICD-10-CM | POA: Insufficient documentation

## 2013-12-09 DIAGNOSIS — Z860100 Personal history of colon polyps, unspecified: Secondary | ICD-10-CM | POA: Insufficient documentation

## 2013-12-09 DIAGNOSIS — M3320 Polymyositis, organ involvement unspecified: Secondary | ICD-10-CM | POA: Insufficient documentation

## 2013-12-09 DIAGNOSIS — I219 Acute myocardial infarction, unspecified: Secondary | ICD-10-CM | POA: Insufficient documentation

## 2013-12-09 DIAGNOSIS — Z853 Personal history of malignant neoplasm of breast: Secondary | ICD-10-CM | POA: Insufficient documentation

## 2013-12-09 DIAGNOSIS — E785 Hyperlipidemia, unspecified: Secondary | ICD-10-CM | POA: Insufficient documentation

## 2013-12-09 DIAGNOSIS — R011 Cardiac murmur, unspecified: Secondary | ICD-10-CM

## 2013-12-09 DIAGNOSIS — K219 Gastro-esophageal reflux disease without esophagitis: Secondary | ICD-10-CM | POA: Insufficient documentation

## 2013-12-09 NOTE — Patient Instructions (Signed)
Please see Dr Johnsie Cancel in 2 weeks. Please see rheumatology. Please see gastroenterology. I will review records from Dr Lubertha Basque to determine when you need lab work again and vaccine status. I have requested you return to see me in 6 weeks, but if you do not need labs that soon, I will call to reschedule a more appropriate time for return visit. Nice to see you!  Preventive Care for Adults, Female A healthy lifestyle and preventive care can promote health and wellness. Preventive health guidelines for women include the following key practices.  A routine yearly physical is a good way to check with your health care provider about your health and preventive screening. It is a chance to share any concerns and updates on your health and to receive a thorough exam.  Visit your dentist for a routine exam and preventive care every 6 months. Brush your teeth twice a day and floss once a day. Good oral hygiene prevents tooth decay and gum disease.  The frequency of eye exams is based on your age, health, family medical history, use of contact lenses, and other factors. Follow your health care provider's recommendations for frequency of eye exams.  Eat a healthy diet. Foods like vegetables, fruits, whole grains, low-fat dairy products, and lean protein foods contain the nutrients you need without too many calories. Decrease your intake of foods high in solid fats, added sugars, and salt. Eat the right amount of calories for you.Get information about a proper diet from your health care provider, if necessary.  Regular physical exercise is one of the most important things you can do for your health. Most adults should get at least 150 minutes of moderate-intensity exercise (any activity that increases your heart rate and causes you to sweat) each week. In addition, most adults need muscle-strengthening exercises on 2 or more days a week.  Maintain a healthy weight. The body mass index (BMI) is a screening tool  to identify possible weight problems. It provides an estimate of body fat based on height and weight. Your health care provider can find your BMI, and can help you achieve or maintain a healthy weight.For adults 20 years and older:  A BMI below 18.5 is considered underweight.  A BMI of 18.5 to 24.9 is normal.  A BMI of 25 to 29.9 is considered overweight.  A BMI of 30 and above is considered obese.  Maintain normal blood lipids and cholesterol levels by exercising and minimizing your intake of saturated fat. Eat a balanced diet with plenty of fruit and vegetables. Blood tests for lipids and cholesterol should begin at age 51 and be repeated every 5 years. If your lipid or cholesterol levels are high, you are over 50, or you are at high risk for heart disease, you may need your cholesterol levels checked more frequently.Ongoing high lipid and cholesterol levels should be treated with medicines if diet and exercise are not working.  If you smoke, find out from your health care provider how to quit. If you do not use tobacco, do not start.  Lung cancer screening is recommended for adults aged 2 80 years who are at high risk for developing lung cancer because of a history of smoking. A yearly low-dose CT scan of the lungs is recommended for people who have at least a 30-pack-year history of smoking and are a current smoker or have quit within the past 15 years. A pack year of smoking is smoking an average of 1 pack of cigarettes a  day for 1 year (for example: 1 pack a day for 30 years or 2 packs a day for 15 years). Yearly screening should continue until the smoker has stopped smoking for at least 15 years. Yearly screening should be stopped for people who develop a health problem that would prevent them from having lung cancer treatment.  If you are pregnant, do not drink alcohol. If you are breastfeeding, be very cautious about drinking alcohol. If you are not pregnant and choose to drink alcohol,  do not have more than 1 drink per day. One drink is considered to be 12 ounces (355 mL) of beer, 5 ounces (148 mL) of wine, or 1.5 ounces (44 mL) of liquor.  Avoid use of street drugs. Do not share needles with anyone. Ask for help if you need support or instructions about stopping the use of drugs.  High blood pressure causes heart disease and increases the risk of stroke. Your blood pressure should be checked at least every 1 to 2 years. Ongoing high blood pressure should be treated with medicines if weight loss and exercise do not work.  If you are 78 years old, ask your health care provider if you should take aspirin to prevent strokes., ask your health care provider if you should take aspirin to prevent strokes.  Diabetes screening involves taking a blood sample to check your fasting blood sugar level. This should be done once every 3 years, after age 30, if you are within normal weight and without risk factors for diabetes. Testing should be considered at a younger age or be carried out more frequently if you are overweight and have at least 1 risk factor for diabetes.  Breast cancer screening is essential preventive care for women. You should practice "breast self-awareness." This means understanding the normal appearance and feel of your breasts and may include breast self-examination. Any changes detected, no matter how small, should be reported to a health care provider. Women in their 7s and 30s should have a clinical breast exam (CBE) by a health care provider as part of a regular health exam every 1 to 3 years. After age 74, women should have a CBE every year. Starting at age 61, women should consider having a mammogram (breast X-ray test) every year. Women who have a family history of breast cancer should talk to their health care provider about genetic screening. Women at a high risk of breast cancer should talk to their health care providers about having an MRI and a mammogram every year.  Breast cancer gene (BRCA)-related cancer risk assessment is recommended  for women who have family members with BRCA-related cancers. BRCA-related cancers include breast, ovarian, tubal, and peritoneal cancers. Having family members with these cancers may be associated with an increased risk for harmful changes (mutations) in the breast cancer genes BRCA1 and BRCA2. Results of the assessment will determine the need for genetic counseling and BRCA1 and BRCA2 testing.  The Pap test is a screening test for cervical cancer. A Pap test can show cell changes on the cervix that might become cervical cancer if left untreated. A Pap test is a procedure in which cells are obtained and examined from the lower end of the uterus (cervix).  Women should have a Pap test starting at age 71.  Between ages 67 and 54, Pap tests should be repeated every 2 years.  Beginning at age 70, you should have a Pap test every 3 years as long as the past 3 Pap tests have been normal.  Some women have medical problems that increase the  chance of getting cervical cancer. Talk to your health care provider about these problems. It is especially important to talk to your health care provider if a new problem develops soon after your last Pap test. In these cases, your health care provider may recommend more frequent screening and Pap tests.  The above recommendations are the same for women who have or have not gotten the vaccine for human papillomavirus (HPV).  If you had a hysterectomy for a problem that was not cancer or a condition that could lead to cancer, then you no longer need Pap tests. Even if you no longer need a Pap test, a regular exam is a good idea to make sure no other problems are starting.  If you are between ages 33 and 45 years, and you have had normal Pap tests going back 10 years, you no longer need Pap tests. Even if you no longer need a Pap test, a regular exam is a good idea to make sure no other problems are starting.  If you have had past treatment for cervical cancer or a  condition that could lead to cancer, you need Pap tests and screening for cancer for at least 20 years after your treatment.  If Pap tests have been discontinued, risk factors (such as a new sexual partner) need to be reassessed to determine if screening should be resumed.  The HPV test is an additional test that may be used for cervical cancer screening. The HPV test looks for the virus that can cause the cell changes on the cervix. The cells collected during the Pap test can be tested for HPV. The HPV test could be used to screen women aged 18 years and older, and should be used in women of any age who have unclear Pap test results. After the age of 58, women should have HPV testing at the same frequency as a Pap test.  Colorectal cancer can be detected and often prevented. Most routine colorectal cancer screening begins at the age of 64 years and continues through age 104 years. However, your health care provider may recommend screening at an earlier age if you have risk factors for colon cancer. On a yearly basis, your health care provider may provide home test kits to check for hidden blood in the stool. Use of a small camera at the end of a tube, to directly examine the colon (sigmoidoscopy or colonoscopy), can detect the earliest forms of colorectal cancer. Talk to your health care provider about this at age 57, when routine screening begins. Direct exam of the colon should be repeated every 5 10 years through age 78 years, unless early forms of pre-cancerous polyps or small growths are found.  People who are at an increased risk for hepatitis B should be screened for this virus. You are considered at high risk for hepatitis B if:  You were born in a country where hepatitis B occurs often. Talk with your health care provider about which countries are considered high risk.  Your parents were born in a high-risk country and you have not received a shot to protect against hepatitis B (hepatitis B  vaccine).  You have HIV or AIDS.  You use needles to inject street drugs.  You live with, or have sex with, someone who has Hepatitis B.  You get hemodialysis treatment.  You take certain medicines for conditions like cancer, organ transplantation, and autoimmune conditions.  Hepatitis C blood testing is recommended for all people born from [birth year]  through 1965 and any individual with known risks for hepatitis C.  Practice safe sex. Use condoms and avoid high-risk sexual practices to reduce the spread of sexually transmitted infections (STIs). STIs include gonorrhea, chlamydia, syphilis, trichomonas, herpes, HPV, and human immunodeficiency virus (HIV). Herpes, HIV, and HPV are viral illnesses that have no cure. They can result in disability, cancer, and death. Sexually active women aged 58 years and younger should be checked for chlamydia. Older women with new or multiple partners should also be tested for chlamydia. Testing for other STIs is recommended if you are sexually active and at increased risk.  Osteoporosis is a disease in which the bones lose minerals and strength with aging. This can result in serious bone fractures or breaks. The risk of osteoporosis can be identified using a bone density scan. Women ages 29 years and over and women at risk for fractures or osteoporosis should discuss screening with their health care providers. Ask your health care provider whether you should take a calcium supplement or vitamin D to reduce the rate of osteoporosis.  Menopause can be associated with physical symptoms and risks. Hormone replacement therapy is available to decrease symptoms and risks. You should talk to your health care provider about whether hormone replacement therapy is right for you.  Use sunscreen. Apply sunscreen liberally and repeatedly throughout the day. You should seek shade when your shadow is shorter than you. Protect yourself by wearing long sleeves, pants, a wide-brimmed  hat, and sunglasses year round, whenever you are outdoors.  Once a month, do a whole body skin exam, using a mirror to look at the skin on your back. Tell your health care provider of new moles, moles that have irregular borders, moles that are larger than a pencil eraser, or moles that have changed in shape or color.  Stay current with required vaccines (immunizations).  Influenza vaccine. All adults should be immunized every year.  Tetanus, diphtheria, and acellular pertussis (Td, Tdap) vaccine. Pregnant women should receive 1 dose of Tdap vaccine during each pregnancy. The dose should be obtained regardless of the length of time since the last dose. Immunization is preferred during the 27th 36th week of gestation. An adult who has not previously received Tdap or who does not know her vaccine status should receive 1 dose of Tdap. This initial dose should be followed by tetanus and diphtheria toxoids (Td) booster doses every 10 years. Adults with an unknown or incomplete history of completing a 3-dose immunization series with Td-containing vaccines should begin or complete a primary immunization series including a Tdap dose. Adults should receive a Td booster every 10 years.  Varicella vaccine. An adult without evidence of immunity to varicella should receive 2 doses or a second dose if she has previously received 1 dose. Pregnant females who do not have evidence of immunity should receive the first dose after pregnancy. This first dose should be obtained before leaving the health care facility. The second dose should be obtained 4 8 weeks after the first dose.  Human papillomavirus (HPV) vaccine. Females aged 47 26 years who have not received the vaccine previously should obtain the 3-dose series. The vaccine is not recommended for use in pregnant females. However, pregnancy testing is not needed before receiving a dose. If a female is found to be pregnant after receiving a dose, no treatment is  needed. In that case, the remaining doses should be delayed until after the pregnancy. Immunization is recommended for any person with an immunocompromised condition  through the age of 23 years if she did not get any or all doses earlier. During the 3-dose series, the second dose should be obtained 4 8 weeks after the first dose. The third dose should be obtained 24 weeks after the first dose and 16 weeks after the second dose.  Zoster vaccine. One dose is recommended for adults aged 68 years or older unless certain conditions are present.  Measles, mumps, and rubella (MMR) vaccine. Adults born before [birth year] generally are considered immune to measles and mumps. Adults born in [birth year] or later should have 1 or more doses of MMR vaccine unless there is a contraindication to the vaccine or there is laboratory evidence of immunity to each of the three diseases. A routine second dose of MMR vaccine should be obtained at least 28 days after the first dose for students attending postsecondary schools, health care workers, or international travelers. People who received inactivated measles vaccine or an unknown type of measles vaccine during 1963 1967 should receive 2 doses of MMR vaccine. People who received inactivated mumps vaccine or an unknown type of mumps vaccine before 1979 and are at high risk for mumps infection should consider immunization with 2 doses of MMR vaccine. For females of childbearing age, rubella immunity should be determined. If there is no evidence of immunity, females who are not pregnant should be vaccinated. If there is no evidence of immunity, females who are pregnant should delay immunization until after pregnancy. Unvaccinated health care workers born before [birth year] who lack laboratory evidence of measles, mumps, or rubella immunity or laboratory confirmation of disease should consider measles and mumps immunization with 2 doses of MMR vaccine or rubella immunization with 1 dose of MMR  vaccine.  Pneumococcal 13-valent conjugate (PCV13) vaccine. When indicated, a person who is uncertain of her immunization history and has no record of immunization should receive the PCV13 vaccine. An adult aged 43 years or older who has certain medical conditions and has not been previously immunized should receive 1 dose of PCV13 vaccine. This PCV13 should be followed with a dose of pneumococcal polysaccharide (PPSV23) vaccine. The PPSV23 vaccine dose should be obtained at least 8 weeks after the dose of PCV13 vaccine. An adult aged 104 years or older who has certain medical conditions and previously received 1 or more doses of PPSV23 vaccine should receive 1 dose of PCV13. The PCV13 vaccine dose should be obtained 1 or more years after the last PPSV23 vaccine dose.  Pneumococcal polysaccharide (PPSV23) vaccine. When PCV13 is also indicated, PCV13 should be obtained first. All adults aged 12 years and older should be immunized. An adult younger than age 59 years who has certain medical conditions should be immunized. Any person who resides in a nursing home or long-term care facility should be immunized. An adult smoker should be immunized. People with an immunocompromised condition and certain other conditions should receive both PCV13 and PPSV23 vaccines. People with human immunodeficiency virus (HIV) infection should be immunized as soon as possible after diagnosis. Immunization during chemotherapy or radiation therapy should be avoided. Routine use of PPSV23 vaccine is not recommended for American Indians, [place] Natives, or people younger than 65 years unless there are medical conditions that require PPSV23 vaccine. When indicated, people who have unknown immunization and have no record of immunization should receive PPSV23 vaccine. One-time revaccination 5 years after the first dose of PPSV23 is recommended for people aged 41 64 years who have chronic kidney failure, nephrotic syndrome, asplenia, or  immunocompromised  conditions. People who received 1 2 doses of PPSV23 before age 56 years should receive another dose of PPSV23 vaccine at age 29 years or later if at least 5 years have passed since the previous dose. Doses of PPSV23 are not needed for people immunized with PPSV23 at or after age 62 years.  Meningococcal vaccine. Adults with asplenia or persistent complement component deficiencies should receive 2 doses of quadrivalent meningococcal conjugate (MenACWY-D) vaccine. The doses should be obtained at least 2 months apart. Microbiologists working with certain meningococcal bacteria, [place] recruits, people at risk during an outbreak, and people who travel to or live in countries with a high rate of meningitis should be immunized. A first-year college student up through age 13 years who is living in a residence hall should receive a dose if she did not receive a dose on or after her 16th birthday. Adults who have certain high-risk conditions should receive one or more doses of vaccine.  Hepatitis A vaccine. Adults who wish to be protected from this disease, have certain high-risk conditions, work with hepatitis A-infected animals, work in hepatitis A research labs, or travel to or work in countries with a high rate of hepatitis A should be immunized. Adults who were previously unvaccinated and who anticipate close contact with an international adoptee during the first 60 days after arrival in the [place] from a country with a high rate of hepatitis A should be immunized.  Hepatitis B vaccine. Adults who wish to be protected from this disease, have certain high-risk conditions, may be exposed to blood or other infectious body fluids, are household contacts or sex partners of hepatitis B positive people, are clients or workers in certain care facilities, or travel to or work in countries with a high rate of hepatitis B should be immunized.  Haemophilus influenzae type b (Hib) vaccine. A  previously unvaccinated person with asplenia or sickle cell disease or having a scheduled splenectomy should receive 1 dose of Hib vaccine. Regardless of previous immunization, a recipient of a hematopoietic stem cell transplant should receive a 3-dose series 6 12 months after her successful transplant. Hib vaccine is not recommended for adults with HIV infection. Preventive Services / Frequency Ages 65 years and over  Blood pressure check.** / Every 1 to 2 years.  Lipid and cholesterol check.** / Every 5 years beginning at age 8 years.  Lung cancer screening. / Every year if you are aged 62 80 years and have a 30-pack-year history of smoking and currently smoke or have quit within the past 15 years. Yearly screening is stopped once you have quit smoking for at least 15 years or develop a health problem that would prevent you from having lung cancer treatment.  Clinical breast exam.** / Every year after age 45 years.  BRCA-related cancer risk assessment.** / For women who have family members with a BRCA-related cancer (breast, ovarian, tubal, or peritoneal cancers).  Mammogram.** / Every year beginning at age 46 years and continuing for as long as you are in good health. Consult with your health care provider.  Pap test.** / Every 3 years starting at age 57 years through age 32 or 38 years with 3 consecutive normal Pap tests. Testing can be stopped between 65 and 70 years with 3 consecutive normal Pap tests and no abnormal Pap or HPV tests in the past 10 years.  HPV screening.** / Every 3 years from ages 16 years through ages 72 or 64 years with a history of 3  consecutive normal Pap tests. Testing can be stopped between 65 and 70 years with 3 consecutive normal Pap tests and no abnormal Pap or HPV tests in the past 10 years.  Fecal occult blood test (FOBT) of stool. / Every year beginning at age 87 years and continuing until age 34 years. You may not need to do this test if you get a  colonoscopy every 10 years.  Flexible sigmoidoscopy or colonoscopy.** / Every 5 years for a flexible sigmoidoscopy or every 10 years for a colonoscopy beginning at age 44 years and continuing until age 43 years.  Hepatitis C blood test.** / For all people born from [birth year] through 1963 and any individual with known risks for hepatitis C.  Osteoporosis screening.** / A one-time screening for women ages 43 years and over and women at risk for fractures or osteoporosis.  Skin self-exam. / Monthly.  Influenza vaccine. / Every year.  Tetanus, diphtheria, and acellular pertussis (Tdap/Td) vaccine.** / 1 dose of Td every 10 years.  Varicella vaccine.** / Consult your health care provider.  Zoster vaccine.** / 1 dose for adults aged 88 years or older.  Pneumococcal 13-valent conjugate (PCV13) vaccine.** / Consult your health care provider.  Pneumococcal polysaccharide (PPSV23) vaccine.** / 1 dose for all adults aged 8 years and older.  Meningococcal vaccine.** / Consult your health care provider.  Hepatitis A vaccine.** / Consult your health care provider.  Hepatitis B vaccine.** / Consult your health care provider.  Haemophilus influenzae type b (Hib) vaccine.** / Consult your health care provider. ** Family history and personal history of risk and conditions may change your health care provider's recommendations. Document Released: 09/06/2001 Document Revised: 05/01/2013 Document Reviewed: 12/06/2010 [facility] Patient Information 2014 [place], [place].

## 2013-12-09 NOTE — Progress Notes (Signed)
Subjective:     Sara Lynch is an articulate 80 y.o. female who is here to establish care. She recently moved from [place], wher she lived independently in her own home, to live with daughter after suffering a fall last October, then an [place] last month. She had PCP, cardiologist, gastroenterologist, and rheumatologist in [place] last month, she is being treated medically, and is having frequent episodes of angina while at rest, relieved by 1 NTG tablet. She suffers from polymyositis which affects her LE strength. She uses her arms to lift herself from a sitting position. She has a history of gout, but has had no attack in several years. She is accompanied by her daughter today.   History   Social History  . Marital Status: Widowed    Spouse Name: N/A    Number of Children: 2  . Years of Education: N/A   Occupational History  . retired    Social History Main Topics  . Smoking status: Never Smoker   . Smokeless tobacco: Not on file  . Alcohol Use: No  . Drug Use: No  . Sexual Activity: No   Other Topics Concern  . Not on file   Social History Narrative   Sara Lynch lives with her daughter in [place]. She lived most of her life in [place], [place]. She has 2 grown daughters and several grand children.   There are no preventive care reminders to display for this patient.  The following portions of the patient's history were reviewed and updated as appropriate: allergies, current medications, past family history, past medical history, past social history, past surgical history and problem list.  Review of Systems Pertinent items are noted in HPI.   Objective:    BP 118/58  Pulse 57  Temp(Src) 98 F (36.7 C) (Oral)  Resp 18  Ht 5\' 6"  (1.676 m)  Wt 136 lb (61.689 kg)  BMI 21.96 kg/m2  SpO2 96% General appearance: alert, cooperative, appears stated age and no distress Head: Normocephalic, without obvious abnormality, atraumatic Eyes: negative findings: lids and lashes  normal and conjunctivae and sclerae normal Ears: normal TM's and external ear canals both ears Throat: lips, mucosa, and tongue normal; teeth and gums normal Lungs: clear to auscultation bilaterally Heart: RRR, systolic murmur Extremities: edema R LE +1 pitting edema from foot to mid-calf. L LE +1 edema foot to ankle. Pulses: 2+ and symmetric    Assessment:  1. Personal history of colonic polyps - Ambulatory referral to Gastroenterology  2. Polymyositis - Ambulatory referral to Rheumatology  3. Gout  4. Myocardial infarction in recovery phase Has appt. W/Sara Lynch in 2 weeks.  5. Heart murmur  6. Personal history of breast cancer  See problem list for complete A&P F/u 6 wks Request PCP records.

## 2013-12-11 DIAGNOSIS — H35349 Macular cyst, hole, or pseudohole, unspecified eye: Secondary | ICD-10-CM | POA: Diagnosis not present

## 2013-12-12 ENCOUNTER — Telehealth: Payer: Self-pay | Admitting: Nurse Practitioner

## 2013-12-12 NOTE — Telephone Encounter (Signed)
FYI

## 2013-12-12 NOTE — Telephone Encounter (Signed)
Patient Information:  Caller Name: [name]  Phone: [phone]  Patient: Sara Lynch, Sara Lynch  Gender: Female  DOB: [birth year]  Age: 80 Years  PCP: Nicky Pugh  Office Follow Up:  Does the office need to follow up with this patient?: No  Instructions For The Office: N/A  RN Note:  Hoarseness present. Offered and declined appointment.  Hydrate and humidify.  Agreed to be seen if fever develops or feels worse.   Symptoms  Reason For Call & Symptoms: Nasal and head congestion, yellow nasal mucus, with scratchy throat.  Reviewed Health History In EMR: Yes  Reviewed Medications In EMR: Yes  Reviewed Allergies In EMR: Yes  Reviewed Surgeries / Procedures: Yes  Date of Onset of Symptoms: 12/11/2013  Guideline(s) Used:  Colds  Disposition Per Guideline:   Home Care  Reason For Disposition Reached:   Colds with no complications  Advice Given:  Reassurance  It sounds like an uncomplicated cold that we can treat at home.  Colds are very common and may make you feel uncomfortable.  Colds are caused by viruses, and no medicine or "shot" will cure an uncomplicated cold.  Colds are usually not serious.  Here is some care advice that should help.  For a Runny Nose With Profuse Discharge:   Nasal mucus and discharge helps to wash viruses and bacteria out of the nose and sinuses.  Blowing the nose is all that is needed.  For a Stuffy Nose - Use Nasal Washes:  Introduction: Saline (salt water) nasal irrigation (nasal wash) is an effective and simple home remedy for treating stuffy nose and sinus congestion. The nose can be irrigated by pouring, spraying, or squirting salt water into the nose and then letting it run back out.  Methods: There are several ways to perform nasal irrigation. You can use a saline nasal spray bottle (available over-the-counter), a rubber ear syringe, a medical syringe without the needle, or a Neti Pot.  Treatment for Associated Symptoms of Colds:  For muscle aches,  headaches, or moderate fever (more than 101 F or 38.9 C): Take acetaminophen every 4 hours.  Sore throat: Try throat lozenges, hard candy, or warm chicken broth.  Cough: Use cough drops.  Hydrate: Drink adequate liquids.  Humidifier:  If the air in your home is dry, use a cool-mist humidifier  Contagiousness:  The cold virus is present in your nasal secretions.  Cover your nose and mouth with a tissue when you sneeze or cough.  Wash your hands frequently with soap and water.  You can return to work or school after the fever is gone and you feel well enough to participate in normal activities.  Expected Course:   Fever may last 2-3 days  Nasal discharge 7-14 days  Cough up to 2-3 weeks.  Medicines for a Stuffy or Runny Nose:  Most cold medicines that are available over-the-counter (OTC) are not helpful.  Antihistamines: Are only helpful if you also have nasal allergies.  Caution - Nasal Decongestants:  Do not take these medications if you have high blood pressure, heart disease, prostate problems, or an overactive thyroid.  Pain and Fever Medicines:  They are over-the-counter (OTC) drugs that help treat both fever and pain. You can buy them at the drugstore.  Treat fevers above 101 F (38.3 C). The goal of fever therapy is to bring the fever down to a comfortable level. Remember that fever medicine usually lowers fever 2 degrees F (1 - 1 1/2 degrees C).  Acetaminophen (  e.g., Tylenol):  Regular Strength Tylenol: Take 650 mg (two 325 mg pills) by mouth every 4-6 hours as needed. Each Regular Strength Tylenol pill has 325 mg of acetaminophen.  Extra Strength Tylenol: Take 1,000 mg (two 500 mg pills) every 8 hours as needed. Each Extra Strength Tylenol pill has 500 mg of acetaminophen.  The most you should take each day is 3,000 mg (10 Regular Strength or 6 Extra Strength pills a day).  Vitamin C:   A number of experts, including [employer], have promoted taking high  doses of this vitamin as a treatment for the common cold.  Research to date shows that vitamin C has minimal (if any) effect on the duration or degree of cold symptoms. Thus, it cannot be recommended as a treatment.  Echinacea  : There is no proven benefit of using this herbal remedy in treating or preventing the common cold. In fact, current research suggests that it does not help.  Zinc:   Some studies have reported that zinc gluconate lozenges (i.e., Cold-Eeze) may reduce the duration and severity of cold symptoms.  Dosage: Taken by mouth. You should take this with food to minimize the chance of nausea. Follow package instructions.  Side Effects: Some people complain of nausea and a bad taste in their mouth when they take zinc.  Patient Will Follow Care Advice:  YES

## 2013-12-18 ENCOUNTER — Encounter: Payer: Self-pay | Admitting: Nurse Practitioner

## 2013-12-18 ENCOUNTER — Ambulatory Visit (INDEPENDENT_AMBULATORY_CARE_PROVIDER_SITE_OTHER): Payer: Medicare Other | Admitting: Nurse Practitioner

## 2013-12-18 VITALS — BP 123/58 | HR 76 | Temp 97.6°F | Ht 66.0 in | Wt 136.0 lb

## 2013-12-18 DIAGNOSIS — J019 Acute sinusitis, unspecified: Secondary | ICD-10-CM | POA: Diagnosis not present

## 2013-12-18 MED ORDER — DOXYCYCLINE HYCLATE 100 MG PO TABS
100.0000 mg | ORAL_TABLET | Freq: Two times a day (BID) | ORAL | Status: DC
Start: 2013-12-18 — End: 2013-12-25

## 2013-12-18 NOTE — Progress Notes (Signed)
Pre visit review using our clinic review tool, if applicable. No additional management support is needed unless otherwise documented below in the visit note. 

## 2013-12-18 NOTE — Patient Instructions (Signed)
Start antibiotic. Eat yogurt daily at lunch or afternoon to help prevent diarrhea that can be caused by antibiotic. Start daily sinus rinses (Neilmed Sinus rinse) for at least 5-7 days. Stop methotrexate this week. Resume next week if feeling more energetic, less nasal drainage, less cough. Apply A&D ointment or vaseline under nose & inside nares after each time you blow nose. Please follow up in 2 weeks or sooner if you are not improving.    Sinusitis Sinusitis is redness, soreness, and swelling (inflammation) of the paranasal sinuses. Paranasal sinuses are air pockets within the bones of your face (beneath the eyes, the middle of the forehead, or above the eyes). In healthy paranasal sinuses, mucus is able to drain out, and air is able to circulate through them by way of your nose. However, when your paranasal sinuses are inflamed, mucus and air can become trapped. This can allow bacteria and other germs to grow and cause infection. Sinusitis can develop quickly and last only a short time (acute) or continue over a long period (chronic). Sinusitis that lasts for more than 12 weeks is considered chronic.  CAUSES  Causes of sinusitis include:  Allergies.  Structural abnormalities, such as displacement of the cartilage that separates your nostrils (deviated septum), which can decrease the air flow through your nose and sinuses and affect sinus drainage.  Functional abnormalities, such as when the small hairs (cilia) that line your sinuses and help remove mucus do not work properly or are not present. SYMPTOMS  Symptoms of acute and chronic sinusitis are the same. The primary symptoms are pain and pressure around the affected sinuses. Other symptoms include:  Upper toothache.  Earache.  Headache.  Bad breath.  Decreased sense of smell and taste.  A cough, which worsens when you are lying flat.  Fatigue.  Fever.  Thick drainage from your nose, which often is green and may contain pus  (purulent).  Swelling and warmth over the affected sinuses. DIAGNOSIS  Your caregiver will perform a physical exam. During the exam, your caregiver may:  Look in your nose for signs of abnormal growths in your nostrils (nasal polyps).  Tap over the affected sinus to check for signs of infection.  View the inside of your sinuses (endoscopy) with a special imaging device with a light attached (endoscope), which is inserted into your sinuses. If your caregiver suspects that you have chronic sinusitis, one or more of the following tests may be recommended:  Allergy tests.  Nasal culture A sample of mucus is taken from your nose and sent to a lab and screened for bacteria.  Nasal cytology A sample of mucus is taken from your nose and examined by your caregiver to determine if your sinusitis is related to an allergy. TREATMENT  Most cases of acute sinusitis are related to a viral infection and will resolve on their own within 10 days. Sometimes medicines are prescribed to help relieve symptoms (pain medicine, decongestants, nasal steroid sprays, or saline sprays).  However, for sinusitis related to a bacterial infection, your caregiver will prescribe antibiotic medicines. These are medicines that will help kill the bacteria causing the infection.  Rarely, sinusitis is caused by a fungal infection. In theses cases, your caregiver will prescribe antifungal medicine. For some cases of chronic sinusitis, surgery is needed. Generally, these are cases in which sinusitis recurs more than 3 times per year, despite other treatments. HOME CARE INSTRUCTIONS   Drink plenty of water. Water helps thin the mucus so your sinuses can  drain more easily.  Use a humidifier.  Inhale steam 3 to 4 times a day (for example, sit in the bathroom with the shower running).  Apply a warm, moist washcloth to your face 3 to 4 times a day, or as directed by your caregiver.  Use saline nasal sprays to help moisten and  clean your sinuses.  Take over-the-counter or prescription medicines for pain, discomfort, or fever only as directed by your caregiver. SEEK IMMEDIATE MEDICAL CARE IF:  You have increasing pain or severe headaches.  You have nausea, vomiting, or drowsiness.  You have swelling around your face.  You have vision problems.  You have a stiff neck.  You have difficulty breathing. MAKE SURE YOU:   Understand these instructions.  Will watch your condition.  Will get help right away if you are not doing well or get worse. Document Released: 07/11/2005 Document Revised: 10/03/2011 Document Reviewed: 07/26/2011 [facility] Patient Information 2014 [place], [place].

## 2013-12-18 NOTE — Progress Notes (Signed)
   Subjective:    Patient ID: Sara Lynch, female    DOB: 1929-01-07, 80 y.o.   MRN: [id number]  Sinusitis This is a new problem. The current episode started in the past 7 days (6d). The problem has been gradually worsening since onset. There has been no fever. She is experiencing no pain (in general feels "crummy"). Associated symptoms include congestion (nasal), coughing, a hoarse voice and sinus pressure. Pertinent negatives include no chills, headaches, shortness of breath, sore throat or swollen glands. Past treatments include nothing.      Review of Systems  Constitutional: Positive for fatigue. Negative for fever and chills.  HENT: Positive for congestion (nasal), hoarse voice, postnasal drip and sinus pressure. Negative for sore throat.   Respiratory: Positive for cough. Negative for chest tightness, shortness of breath and wheezing.   Musculoskeletal: Negative for back pain.  Neurological: Negative for headaches.       Objective:   Physical Exam  Vitals reviewed. Constitutional: She is oriented to person, place, and time. She appears well-developed and well-nourished. No distress.  HENT:  Head: Normocephalic and atraumatic.  Right Ear: External ear normal.  Left Ear: External ear normal.  Mouth/Throat: Oropharynx is clear and moist. No oropharyngeal exudate.  Effusion L TM, bones visible. Clear R TM. Sore under nose from frequent blowing. Purulent discharge. Sounds nasal.   Eyes: Conjunctivae are normal. Right eye exhibits no discharge. Left eye exhibits no discharge.  Neck: Normal range of motion. No thyromegaly present.  Cardiovascular: Normal rate and regular rhythm.   Murmur heard. Pulmonary/Chest: Effort normal and breath sounds normal. No respiratory distress. She has no wheezes. She has no rales.  Lymphadenopathy:    She has no cervical adenopathy.  Neurological: She is alert and oriented to person, place, and time.  Skin: Skin is warm and dry.  Psychiatric: She  has a normal mood and affect. Her behavior is normal. Thought content normal.          Assessment & Plan:  1. Sinusitis, acute Cough. Immunosuppressed w/methotrexate for polymyositis. - doxycycline (VIBRA-TABS) 100 MG tablet; Take 1 tablet (100 mg total) by mouth 2 (two) times daily.  Dispense: 14 tablet; Refill: 0 Daily sinus rinse. Hold mTX this week. Resume next week if feeling better. See pt instructions.

## 2013-12-24 ENCOUNTER — Encounter: Payer: Self-pay | Admitting: Cardiovascular Disease

## 2013-12-25 ENCOUNTER — Encounter: Payer: Self-pay | Admitting: Cardiovascular Disease

## 2013-12-25 ENCOUNTER — Ambulatory Visit (INDEPENDENT_AMBULATORY_CARE_PROVIDER_SITE_OTHER): Payer: Medicare Other | Admitting: Cardiovascular Disease

## 2013-12-25 VITALS — BP 128/72 | HR 60 | Resp 12 | Ht 66.0 in | Wt 137.8 lb

## 2013-12-25 DIAGNOSIS — M3320 Polymyositis, organ involvement unspecified: Secondary | ICD-10-CM

## 2013-12-25 DIAGNOSIS — R011 Cardiac murmur, unspecified: Secondary | ICD-10-CM

## 2013-12-25 DIAGNOSIS — R0989 Other specified symptoms and signs involving the circulatory and respiratory systems: Secondary | ICD-10-CM | POA: Diagnosis not present

## 2013-12-25 DIAGNOSIS — R079 Chest pain, unspecified: Secondary | ICD-10-CM | POA: Diagnosis not present

## 2013-12-25 DIAGNOSIS — I219 Acute myocardial infarction, unspecified: Secondary | ICD-10-CM

## 2013-12-25 DIAGNOSIS — E785 Hyperlipidemia, unspecified: Secondary | ICD-10-CM

## 2013-12-25 DIAGNOSIS — Z7189 Other specified counseling: Secondary | ICD-10-CM

## 2013-12-25 NOTE — Assessment & Plan Note (Signed)
Seem separate from AS murmur.  Given history of stroke with cath will order [place] to assess carotid and arch morphology and degree of plaque burden / obstruction

## 2013-12-25 NOTE — Assessment & Plan Note (Signed)
Doubt severe AS  Echo to establish EF and degree of aortic valve obstruction and calcification

## 2013-12-25 NOTE — Assessment & Plan Note (Signed)
Continue methotrexate  High risk fall  PT limited by angina

## 2013-12-25 NOTE — Assessment & Plan Note (Signed)
Continue statin will try to locate most recent labs from [facility] or primary

## 2013-12-25 NOTE — Assessment & Plan Note (Signed)
She is a "[facility]" snob according to daughter  Explained that if she is living near [place] that she will likely go to [facility] for emergency and we can handle her heart issues.  Does not want to be DNR  Daughter involved in a constructive way and has health care power of attorney

## 2013-12-25 NOTE — Patient Instructions (Addendum)
Your physician recommends that you schedule a follow-up appointment in: [place]                                       Your physician recommends that you continue on your current medications as directed. Please refer to the Current Medication list given to you today. Your physician has requested that you have a lexiscan myoview. For further information please visit [email or web address]. Please follow instruction sheet, as given.  Your physician has requested that you have a carotid duplex. This test is an ultrasound of the carotid arteries in your neck. It looks at blood flow through these arteries that supply the brain with blood. Allow one hour for this exam. There are no restrictions or special instructions.

## 2013-12-25 NOTE — Assessment & Plan Note (Signed)
Long discussion with patient and daughter.  I explained that embolic shower with cath is not always recurrent especially if abdominal aorta is avoided with radial cath.  Can also think about CTA chest to screen arch/root for complex debris.  For now Would like to do lexiscan myovue to assess extent of ischemic burden and see how much of her "nitro" responsive pain is angina vs anxiety.  She is willing to reconsider cath if high risk scan found given ongoing symptoms.  Asked to have her primary prescribe ativan especially at night.  Will have her put her patch on at 6:00pm instead of am.  Continue ASA/Plavix and beta blocker.  Consider retrial of ranexa in future

## 2013-12-25 NOTE — Progress Notes (Signed)
Patient ID: Sara Lynch, female   DOB: September 14, 1928, 80 y.o.   MRN: [id number]   80 yo referred from [facility] affiliated with [facility].  Her daughter Sara Lynch is her health care power of attorney and with her today. The patient has advancing dementia with LE weakness from polymyositis and moved to [facility] to live with her oldest daughter.  She has CAD that is ill defined  Cath in 6433 complicated by embolic shower with stroke and she was cautioned never to have another.  She has chronic chest pain.  Admitted to [facility] April with MI  This occurred 2 days after starting Ranexa  Daughter thinks some of her "angina" is anxiety driven.  Majority of episodes at night.  She is using a nitro patch during day.   She has bilateral LE weakness from polymyositis and PT/rehab is limited by chest pain.  It is obvious from talking to her and daughter that dementia and memory are getting worse ( that's why she moved to [place] to be with daughter)  She does not want to be DNR and daughter knows she can withdraw care if situation hopeless     ROS: Denies fever, malais, weight loss, blurry vision, decreased visual acuity, cough, sputum, SOB, hemoptysis, pleuritic pain, palpitaitons, heartburn, abdominal pain, melena, lower extremity edema, claudication, or rash.  All other systems reviewed and negative   General: Affect appropriate Healthy:  appears stated age 53: normal Neck supple with no adenopathy JVP normal bilateral bruits no thyromegaly Lungs clear with no wheezing and good diaphragmatic motion Heart:  S1/S2 mild AS murmur,rub, gallop or click PMI normal Abdomen: benighn, BS positve, no tenderness, no AAA no bruit.  No HSM or HJR Distal pulses intact with no bruits No edema Neuro non-focal Skin warm and dry LE weakness bilateral   Medications Current Outpatient Prescriptions  Medication Sig Dispense Refill  . allopurinol (ZYLOPRIM) 100 MG tablet Take 200 mg by mouth daily.       Marland Kitchen amLODipine (NORVASC) 2.5 MG tablet Take 2.5 mg by mouth daily.      Marland Kitchen aspirin 81 MG tablet Take 81 mg by mouth daily.      . carvedilol (COREG) 12.5 MG tablet Take 12.5 mg by mouth 2 (two) times daily with a meal.      . Cholecalciferol (VITAMIN D-3 PO) Take by mouth.      . clopidogrel (PLAVIX) 75 MG tablet Take 75 mg by mouth daily with breakfast.      . folic acid (FOLVITE) 1 MG tablet Take 1 mg by mouth daily.      . furosemide (LASIX) 40 MG tablet Take 40 mg by mouth as needed.      Marland Kitchen losartan (COZAAR) 25 MG tablet Take 25 mg by mouth daily.      . methotrexate 2.5 MG tablet Take by mouth. On wednesdays Take three pills a week      . nitroGLYCERIN (NITRODUR - DOSED IN MG/24 HR) 0.4 mg/hr patch Place 0.4 mg onto the skin every 12 (twelve) hours.       Marland Kitchen omeprazole (PRILOSEC) 20 MG capsule Take 20 mg by mouth daily.      . pravastatin (PRAVACHOL) 40 MG tablet Take 40 mg by mouth daily.       No current facility-administered medications for this visit.    Allergies Codeine; Fosamax; and Penicillins  Family History: Family History  Problem Relation Age of Onset  . Cancer Mother     breast  Social History: History   Social History  . Marital Status: Widowed    Spouse Name: N/A    Number of Children: 2  . Years of Education: N/A   Occupational History  . retired    Social History Main Topics  . Smoking status: Never Smoker   . Smokeless tobacco: Not on file  . Alcohol Use: No  . Drug Use: No  . Sexual Activity: No   Other Topics Concern  . Not on file   Social History Narrative   Sara Lynch lives with her daughter in [place]. She lived most of her life in [place], [place]. She has 2 grown daughters and several grand children.    Electrocardiogram:SR rate 66 LVH nonspecific ST changes   Assessment and Plan

## 2013-12-26 ENCOUNTER — Telehealth: Payer: Self-pay | Admitting: Cardiovascular Disease

## 2013-12-26 NOTE — Telephone Encounter (Signed)
Called Daughter [name], Let her know Per Harle Battiest records were received from [facility]  6.4.15/km

## 2014-01-13 ENCOUNTER — Encounter (HOSPITAL_COMMUNITY): Payer: Medicare Other

## 2014-01-20 ENCOUNTER — Ambulatory Visit (HOSPITAL_COMMUNITY): Payer: Medicare Other | Attending: Cardiovascular Disease | Admitting: Radiology

## 2014-01-20 ENCOUNTER — Ambulatory Visit (HOSPITAL_COMMUNITY): Payer: Medicare Other | Attending: Cardiovascular Disease | Admitting: Cardiology

## 2014-01-20 VITALS — BP 149/71 | Ht 67.0 in | Wt 134.0 lb

## 2014-01-20 DIAGNOSIS — I2510 Atherosclerotic heart disease of native coronary artery without angina pectoris: Secondary | ICD-10-CM | POA: Diagnosis not present

## 2014-01-20 DIAGNOSIS — R0609 Other forms of dyspnea: Secondary | ICD-10-CM | POA: Insufficient documentation

## 2014-01-20 DIAGNOSIS — I219 Acute myocardial infarction, unspecified: Secondary | ICD-10-CM

## 2014-01-20 DIAGNOSIS — R0989 Other specified symptoms and signs involving the circulatory and respiratory systems: Secondary | ICD-10-CM | POA: Insufficient documentation

## 2014-01-20 DIAGNOSIS — E785 Hyperlipidemia, unspecified: Secondary | ICD-10-CM | POA: Insufficient documentation

## 2014-01-20 DIAGNOSIS — R413 Other amnesia: Secondary | ICD-10-CM | POA: Insufficient documentation

## 2014-01-20 DIAGNOSIS — I6529 Occlusion and stenosis of unspecified carotid artery: Secondary | ICD-10-CM | POA: Insufficient documentation

## 2014-01-20 DIAGNOSIS — R079 Chest pain, unspecified: Secondary | ICD-10-CM | POA: Insufficient documentation

## 2014-01-20 MED ORDER — TECHNETIUM TC 99M SESTAMIBI GENERIC - CARDIOLITE
30.0000 | Freq: Once | INTRAVENOUS | Status: AC | PRN
  Administered 2014-01-20: 30 via INTRAVENOUS

## 2014-01-20 MED ORDER — TECHNETIUM TC 99M SESTAMIBI GENERIC - CARDIOLITE
10.0000 | Freq: Once | INTRAVENOUS | Status: AC | PRN
  Administered 2014-01-20: 10 via INTRAVENOUS

## 2014-01-20 MED ORDER — REGADENOSON 0.4 MG/5ML IV SOLN
0.4000 mg | Freq: Once | INTRAVENOUS | Status: AC
  Administered 2014-01-20: 0.4 mg via INTRAVENOUS

## 2014-01-20 NOTE — Progress Notes (Signed)
[facility] [address] [place], [place] [address] [phone]    Cardiology Nuclear Med Study  Sara Lynch is a 80 y.o. female     MRN : [id number]     DOB: April 09, 1929  Procedure Date: 01/20/2014  Nuclear Med Background Indication for Stress Test:  Evaluation for Ischemia, Stent Patency and Abnormal EKG History:  MI-Cath-Embolic shower with cath resulting in CVA 4/15 ECHO: [facility]  Cardiac Risk Factors: CVA, Strong Premature Family History - CAD, Hypertension and Lipids  Symptoms:  Chest Pain and DOE   Nuclear Pre-Procedure Caffeine/Decaff Intake:  None> 12 hrs NPO After: 6:00pm   Lungs:  clear O2 Sat: 97% on room air. IV 0.9% NS with Angio Cath:  22g  IV Site: R Antecubital x 1, tolerated well IV Started by:  Irven Baltimore, RN  Chest Size (in):  36 Cup Size: B  Height: 5\' 7"  (1.702 m)  Weight:  134 lb (60.782 kg)  BMI:  Body mass index is 20.98 kg/(m^2). Tech Comments:  Patient took Coreg, Norvasc, and Cozaar this am. Irven Baltimore, RN.    Nuclear Med Study 1 or 2 day study: 1 day  Stress Test Type:  Carlton Adam  Reading MD: N/A  Order Authorizing Provider:  Jenkins Rouge, MD  Resting Radionuclide: Technetium 70m Sestamibi  Resting Radionuclide Dose: 11.0 mCi   Stress Radionuclide:  Technetium 71m Sestamibi  Stress Radionuclide Dose: 33.0 mCi           Stress Protocol Rest HR: 72 Stress HR: 93  Rest BP: 149/71 Stress BP: 144/63  Exercise Time (min): n/a METS: n/a   Predicted Max HR: 138 bpm % Max HR: 67.39 bpm Rate Pressure Product: 13857   Dose of Adenosine (mg):  n/a Dose of Lexiscan: 0.4 mg  Dose of Atropine (mg): n/a Dose of Dobutamine: n/a mcg/kg/min (at max HR)  Stress Test Technologist: Perrin Maltese, EMT-P  Nuclear Technologist:  Charlton Amor, CNMT     Rest Procedure:  Myocardial perfusion imaging was performed at rest 45 minutes following the intravenous administration of Technetium 35m Sestamibi. Rest ECG: Voltage for  LVH  Stress Procedure:  The patient received IV Lexiscan 0.4 mg over 15-seconds.  Technetium 45m Sestamibi injected at 30-seconds. This patient had abdominal pain and felt weird all over with the Lexiscan injection. Quantitative spect images were obtained after a 45 minute delay. Stress ECG: No significant change from baseline ECG  QPS Raw Data Images:  Mild diaphragmatic attenuation.  Normal left ventricular size. Stress Images:  Normal homogeneous uptake in all areas of the myocardium Rest Images:  Normal homogeneous uptake in all areas of the myocardium. Subtraction (SDS):  No evidence of ischemia. Transient Ischemic Dilatation (Normal <1.22):  1.01 Lung/Heart Ratio (Normal <0.45):  0.24  Quantitative Gated Spect Images QGS EDV:  52 ml QGS ESV:  12 ml  Impression Exercise Capacity:  Lexiscan with no exercise. BP Response:  Normal blood pressure response. Clinical Symptoms:  No chest pain. ECG Impression:  No significant ST segment change suggestive of ischemia. Comparison with Prior Nuclear Study: No images to compare  Overall Impression:  Low risk stress nuclear study. No convincing evidence of reversible ischemia.  The LV cavity is very small and the LV shows vigorous LV contractility.  LV Ejection Fraction: 76%.  LV Wall Motion:  NL LV Function; NL Wall Motion  Darlin Coco MD

## 2014-01-20 NOTE — Progress Notes (Signed)
Carotid duplex performed 

## 2014-01-27 ENCOUNTER — Telehealth: Payer: Self-pay | Admitting: *Deleted

## 2014-01-27 NOTE — Telephone Encounter (Signed)
LEFT MESSAGE FOR  CALL BACK .Sara Lynch

## 2014-01-27 NOTE — Telephone Encounter (Signed)
PT 'S  DAUGHTER  AWARE   DEMENTIA WAS  NOT  WRITTEN DOWN  AS  DX   ONLY NOTED IN DICTATION  AS   MED  TERM FOR MEMORY LOSS .Adonis Housekeeper

## 2014-01-27 NOTE — Telephone Encounter (Signed)
Message copied by Richmond Campbell on Mon Jan 27, 2014 12:52 PM ------      Message from: Ellene Route E      Created: Mon Jan 20, 2014  9:58 AM      Regarding: Patient chart update        Patient's daughter is concerned about her mother's chart saying that she has advanced dementia, when the patient does not. Can this be updated, as soon as possible per daughters request. ------

## 2014-01-30 ENCOUNTER — Telehealth: Payer: Self-pay | Admitting: *Deleted

## 2014-01-30 NOTE — Telephone Encounter (Signed)
ERROR

## 2014-02-11 DIAGNOSIS — I2510 Atherosclerotic heart disease of native coronary artery without angina pectoris: Secondary | ICD-10-CM | POA: Diagnosis not present

## 2014-02-11 DIAGNOSIS — Z79899 Other long term (current) drug therapy: Secondary | ICD-10-CM | POA: Diagnosis not present

## 2014-02-11 DIAGNOSIS — M3320 Polymyositis, organ involvement unspecified: Secondary | ICD-10-CM | POA: Diagnosis not present

## 2014-02-11 DIAGNOSIS — R5383 Other fatigue: Secondary | ICD-10-CM | POA: Diagnosis not present

## 2014-02-11 DIAGNOSIS — M109 Gout, unspecified: Secondary | ICD-10-CM | POA: Diagnosis not present

## 2014-02-11 DIAGNOSIS — R5381 Other malaise: Secondary | ICD-10-CM | POA: Diagnosis not present

## 2014-03-03 ENCOUNTER — Ambulatory Visit (INDEPENDENT_AMBULATORY_CARE_PROVIDER_SITE_OTHER): Payer: Medicare Other | Admitting: Cardiovascular Disease

## 2014-03-03 ENCOUNTER — Encounter: Payer: Self-pay | Admitting: Cardiovascular Disease

## 2014-03-03 VITALS — BP 122/60 | HR 72 | Ht 67.0 in | Wt 132.8 lb

## 2014-03-03 DIAGNOSIS — E785 Hyperlipidemia, unspecified: Secondary | ICD-10-CM | POA: Diagnosis not present

## 2014-03-03 DIAGNOSIS — I350 Nonrheumatic aortic (valve) stenosis: Secondary | ICD-10-CM

## 2014-03-03 DIAGNOSIS — I6529 Occlusion and stenosis of unspecified carotid artery: Secondary | ICD-10-CM | POA: Diagnosis not present

## 2014-03-03 DIAGNOSIS — R011 Cardiac murmur, unspecified: Secondary | ICD-10-CM | POA: Insufficient documentation

## 2014-03-03 DIAGNOSIS — I359 Nonrheumatic aortic valve disorder, unspecified: Secondary | ICD-10-CM | POA: Diagnosis not present

## 2014-03-03 DIAGNOSIS — I219 Acute myocardial infarction, unspecified: Secondary | ICD-10-CM

## 2014-03-03 NOTE — Assessment & Plan Note (Signed)
AS murmur  F/u echo 6 months when she sees me

## 2014-03-03 NOTE — Assessment & Plan Note (Signed)
Cholesterol is at goal.  Continue current dose of statin and diet Rx.  No myalgias or side effects.  F/U  LFT's in 6 months. No results found for this basename: LDLCALC  Labs with primary            

## 2014-03-03 NOTE — Progress Notes (Signed)
Patient ID: Sara Lynch, female   DOB: 11/26/1928, 80 y.o.   MRN: [id number] 80 yo referred from [facility]. [name] is her health care power of attorney and with her today. The patient has advancing dementia with LE weakness from polymyositis and moved to [facility] to live with her oldest daughter. She has CAD that is ill defined Cath in [id number] complicated by embolic shower with stroke and she was cautioned never to have another. She has chronic chest pain. Admitted to [facility] April with MI This occurred 2 days after starting Ranexa Daughter thinks some of her "angina" is anxiety driven. Majority of episodes at night. She is using a nitro patch during day.  She has bilateral LE weakness from polymyositis and PT/rehab is limited by chest pain. It is obvious from talking to her and daughter that dementia and memory are getting worse ( that's why she moved to [facility] to be with daughter) She does not want to be DNR and daughter knows she can withdraw care if situation hopeless   01/21/14  Nuclear study normal with no ischemia  EF 76%  Doppler with bilateral 40-59% ICA stenosis   Still with anginal sounding ;pain with exertion.  Improved with nitro patch at night     ROS: Denies fever, malais, weight loss, blurry vision, decreased visual acuity, cough, sputum, SOB, hemoptysis, pleuritic pain, palpitaitons, heartburn, abdominal pain, melena, lower extremity edema, claudication, or rash.  All other systems reviewed and negative  General: Affect appropriate Healthy:  appears stated age 25: normal Neck supple with no adenopathy JVP normal no bruits no thyromegaly Lungs clear with no wheezing and good diaphragmatic motion Heart:  S1/S2 AS murmur, no rub, gallop or click PMI normal Abdomen: benighn, BS positve, no tenderness, no AAA no bruit.  No HSM or HJR Distal pulses intact with no bruits No edema Neuro non-focal Skin warm and dry No  muscular weakness   Current Outpatient Prescriptions  Medication Sig Dispense Refill  . allopurinol (ZYLOPRIM) 100 MG tablet Take 200 mg by mouth daily.      Marland Kitchen amLODipine (NORVASC) 2.5 MG tablet Take 2.5 mg by mouth daily.      Marland Kitchen aspirin 81 MG tablet Take 81 mg by mouth daily.      . carvedilol (COREG) 12.5 MG tablet Take 12.5 mg by mouth 2 (two) times daily with a meal.      . Cholecalciferol (VITAMIN D-3 PO) Take by mouth.      . clopidogrel (PLAVIX) 75 MG tablet Take 75 mg by mouth daily with breakfast.      . folic acid (FOLVITE) 1 MG tablet Take 1 mg by mouth daily.      . furosemide (LASIX) 40 MG tablet Take 40 mg by mouth as needed.      Marland Kitchen losartan (COZAAR) 25 MG tablet Take 25 mg by mouth daily.      . methotrexate 2.5 MG tablet Take by mouth. On wednesdays Take three pills a week      . nitroGLYCERIN (NITRODUR - DOSED IN MG/24 HR) 0.4 mg/hr patch Place 0.4 mg onto the skin every 12 (twelve) hours.       Marland Kitchen omeprazole (PRILOSEC) 20 MG capsule Take 20 mg by mouth daily.      . pravastatin (PRAVACHOL) 40 MG tablet Take 40 mg by mouth daily.       No current facility-administered medications for this visit.    Allergies  Codeine; Fosamax;  and Penicillins  Electrocardiogram: SR rate 66 LVH  Septal infarct   Assessment and Plan

## 2014-03-03 NOTE — Assessment & Plan Note (Signed)
Not clear if she has epicardial disease  Myovue normal  Ok to Rx with nitrates Cardiac rehab  Previous embolic shower with cath Avoid if possible

## 2014-03-03 NOTE — Patient Instructions (Addendum)

## 2014-03-07 ENCOUNTER — Encounter: Payer: Self-pay | Admitting: Nurse Practitioner

## 2014-04-10 ENCOUNTER — Telehealth (HOSPITAL_COMMUNITY): Payer: Self-pay | Admitting: *Deleted

## 2014-04-16 ENCOUNTER — Other Ambulatory Visit: Payer: Self-pay

## 2014-04-16 ENCOUNTER — Telehealth: Payer: Self-pay | Admitting: Cardiovascular Disease

## 2014-04-16 MED ORDER — LOSARTAN POTASSIUM 25 MG PO TABS: 25.0000 mg | ORAL_TABLET | Freq: Every day | ORAL | Status: DC

## 2014-04-16 MED ORDER — FUROSEMIDE 40 MG PO TABS: 40.0000 mg | ORAL_TABLET | ORAL | Status: DC | PRN

## 2014-04-16 MED ORDER — CARVEDILOL 12.5 MG PO TABS: 12.5000 mg | ORAL_TABLET | Freq: Two times a day (BID) | ORAL | Status: DC

## 2014-04-16 MED ORDER — NITROGLYCERIN 0.4 MG/HR TD PT24: 0.4000 mg | MEDICATED_PATCH | Freq: Two times a day (BID) | TRANSDERMAL | Status: DC

## 2014-04-16 MED ORDER — CLOPIDOGREL BISULFATE 75 MG PO TABS: 75.0000 mg | ORAL_TABLET | Freq: Every day | ORAL | Status: DC

## 2014-04-16 MED ORDER — AMLODIPINE BESYLATE 2.5 MG PO TABS: 2.5000 mg | ORAL_TABLET | Freq: Every day | ORAL | Status: DC

## 2014-04-16 NOTE — Telephone Encounter (Signed)
New problem   Pt want to begin to receive all her prescription now through [facility]. Please call pt if any questions.

## 2014-04-16 NOTE — Telephone Encounter (Signed)
[facility]  Patient verbalized understanding

## 2014-04-16 NOTE — Telephone Encounter (Signed)
Spoke with patients daughter, Rosanne Ashing and informed her that we would send cardiac meds to be refilled through [facility]. No labs done here. Should return in 6 months to see Dr. Johnsie Cancel.

## 2014-04-18 ENCOUNTER — Encounter: Payer: Self-pay | Admitting: Nurse Practitioner

## 2014-04-18 ENCOUNTER — Ambulatory Visit (INDEPENDENT_AMBULATORY_CARE_PROVIDER_SITE_OTHER): Payer: Medicare Other | Admitting: Nurse Practitioner

## 2014-04-18 ENCOUNTER — Telehealth: Payer: Self-pay | Admitting: Nurse Practitioner

## 2014-04-18 VITALS — BP 162/76 | HR 71 | Temp 98.1°F | Resp 18 | Ht 67.0 in | Wt 137.0 lb

## 2014-04-18 DIAGNOSIS — I6529 Occlusion and stenosis of unspecified carotid artery: Secondary | ICD-10-CM | POA: Diagnosis not present

## 2014-04-18 DIAGNOSIS — Z780 Asymptomatic menopausal state: Secondary | ICD-10-CM

## 2014-04-18 DIAGNOSIS — Z79899 Other long term (current) drug therapy: Secondary | ICD-10-CM

## 2014-04-18 DIAGNOSIS — Z9181 History of falling: Secondary | ICD-10-CM | POA: Diagnosis not present

## 2014-04-18 DIAGNOSIS — E785 Hyperlipidemia, unspecified: Secondary | ICD-10-CM

## 2014-04-18 LAB — TSH: TSH: 2.42 u[IU]/mL (ref 0.35–4.50)

## 2014-04-18 LAB — CBC WITH DIFFERENTIAL/PLATELET
BASOS PCT: 0.6 % (ref 0.0–3.0)
Basophils Absolute: 0 10*3/uL (ref 0.0–0.1)
EOS ABS: 0.6 10*3/uL (ref 0.0–0.7)
Eosinophils Relative: 7.9 % — ABNORMAL HIGH (ref 0.0–5.0)
HCT: 35.7 % — ABNORMAL LOW (ref 36.0–46.0)
HEMOGLOBIN: 11.8 g/dL — AB (ref 12.0–15.0)
Lymphocytes Relative: 19.6 % (ref 12.0–46.0)
Lymphs Abs: 1.4 10*3/uL (ref 0.7–4.0)
MCHC: 33.1 g/dL (ref 30.0–36.0)
MCV: 103.2 fl — ABNORMAL HIGH (ref 78.0–100.0)
MONO ABS: 0.6 10*3/uL (ref 0.1–1.0)
Monocytes Relative: 8.2 % (ref 3.0–12.0)
NEUTROS ABS: 4.6 10*3/uL (ref 1.4–7.7)
Neutrophils Relative %: 63.7 % (ref 43.0–77.0)
Platelets: 241 10*3/uL (ref 150.0–400.0)
RBC: 3.46 Mil/uL — ABNORMAL LOW (ref 3.87–5.11)
RDW: 17.1 % — ABNORMAL HIGH (ref 11.5–15.5)
WBC: 7.2 10*3/uL (ref 4.0–10.5)

## 2014-04-18 LAB — COMPREHENSIVE METABOLIC PANEL
ALK PHOS: 90 U/L (ref 39–117)
ALT: 28 U/L (ref 0–35)
AST: 30 U/L (ref 0–37)
Albumin: 4.1 g/dL (ref 3.5–5.2)
BILIRUBIN TOTAL: 0.8 mg/dL (ref 0.2–1.2)
BUN: 28 mg/dL — AB (ref 6–23)
CO2: 26 mEq/L (ref 19–32)
CREATININE: 1.3 mg/dL — AB (ref 0.4–1.2)
Calcium: 9.4 mg/dL (ref 8.4–10.5)
Chloride: 105 mEq/L (ref 96–112)
GFR: 42.33 mL/min — ABNORMAL LOW (ref >60.00)
Glucose, Bld: 144 mg/dL — ABNORMAL HIGH (ref 70–99)
Potassium: 5.3 mEq/L — ABNORMAL HIGH (ref 3.5–5.1)
Sodium: 137 mEq/L (ref 135–145)
Total Protein: 6.5 g/dL (ref 6.0–8.3)

## 2014-04-18 LAB — VITAMIN D 25 HYDROXY (VIT D DEFICIENCY, FRACTURES): VITD: 50.79 ng/mL (ref 30.00–100.00)

## 2014-04-18 LAB — LIPID PANEL
Cholesterol: 172 mg/dL (ref 0–200)
HDL: 55.6 mg/dL (ref >39.00)
LDL CALC: 87 mg/dL (ref 0–99)
NONHDL: 116.4
TRIGLYCERIDES: 145 mg/dL (ref 0.0–149.0)
Total CHOL/HDL Ratio: 3
VLDL: 29 mg/dL (ref 0.0–40.0)

## 2014-04-18 LAB — VITAMIN B12: Vitamin B-12: 529 pg/mL (ref 211–911)

## 2014-04-18 NOTE — Assessment & Plan Note (Signed)
Continue pravastatin. Lipids today.

## 2014-04-18 NOTE — Progress Notes (Signed)
Pre visit review using our clinic review tool, if applicable. No additional management support is needed unless otherwise documented below in the visit note. 

## 2014-04-18 NOTE — Progress Notes (Signed)
Subjective:     Sara Lynch is a 80 y.o. female who presents for follow up of medications. Since I saw her last May, she was kindly evaluated by Dr Johnsie Cancel who is continuing her cozaar, amlodipine, plavix, coreg, lasix, & NTG. Her w/u 01/21/14 included Nuclear study: normal with no ischemia EF 76%, Doppler with bilateral 40-59% ICA stenosis. She will f/u with him in December. She was also evaluated by Dr Trudie Reed for polymyositis. Dr Trudie Reed is continuing MTX & allopurinol. She will see her again next week. Pt reports ongoing LE weakness evidenced by having to use arms to rise from chair. This has been ongoing for years-no exacerbation. She fell last spring resulting in arm fracture. She did fall prevention w/PT at that time. She does not exercise regularly, but has been walking some. Sara Lynch states she feels well-is using NTG patch less frequently with no exacerbation of symptoms. She is concerned about 1 episode of "burning" in chest that occurred a few weeks ago, during the night, after eating a late meal. She ate a piece of bread & pain resolved. She has been on prilosec 20 mg for many years. She also c/o hoarse voice & occasional cough foe a couple of weeks. We discussed this may be worsened GERD or could be post-nasal drip from allergies.  The following portions of the patient's history were reviewed and updated as appropriate: allergies, current medications, past medical history, past social history, past surgical history and problem list.  Review of Systems Pertinent items are noted in HPI.    Objective:    BP 162/76  Pulse 71  Temp(Src) 98.1 F (36.7 C) (Oral)  Resp 18  Ht 5\' 7"  (1.702 m)  Wt 137 lb (62.143 kg)  BMI 21.45 kg/m2  SpO2 97% BP 162/76  Pulse 71  Temp(Src) 98.1 F (36.7 C) (Oral)  Resp 18  Ht 5\' 7"  (1.702 m)  Wt 137 lb (62.143 kg)  BMI 21.45 kg/m2  SpO2 97% General appearance: appears stated age and no distress Head: Normocephalic, without obvious abnormality,  atraumatic Eyes: negative findings: lids and lashes normal, conjunctivae and sclerae normal and corneas clear Ears: normal TM's and external ear canals both ears Throat: lips, mucosa, and tongue normal; teeth and gums normal Lungs: clear to auscultation bilaterally Heart: regular rate and rhythm, S1, S2 normal, no murmur, click, rub or gallop and R carotid bruit Extremities: extremities normal, atraumatic, no cyanosis or edema Pulses: 2+ and symmetric    Assessment:     1. High risk medications (not anticoagulants) long-term use - CBC with Differential - Comprehensive metabolic panel - Lipid panel - TSH - Vitamin B12 - Vit D  25 hydroxy (rtn osteoporosis monitoring)  2. Postmenopausal - Vitamin B12 - Vit D  25 hydroxy (rtn osteoporosis monitoring)  3. Elevated lipids  4. Risk for falls  5. Cough/hoarse DD: postnasal drip, worsened GERD  See problem list for complete A&P See pt instructions. F/u 1 mo.

## 2014-04-18 NOTE — Patient Instructions (Signed)
Please get high dose flu shot at [facility].  OK to take Tums for breakthrough indigestion. I would like for you start a probiotic. Take daily. There are several on the market. I prefer Culterelle, Align, or Florastar. Take for at least 3  Months. It will help digestion and can improve immune cell function.  Start sinus rinse daily to decrease post-nasal drip-this may be why you are hoarse & have to cough & clear throat. Use daily for at least 5 to 7 days. If you have chronic allergies, you should use it daily to 4 times weekly.  We will call with lab results.  Please exercise daily to strengthen legs & hips to prevent falls: walk, chair exercise, or exercises from previous physical therapy for fall prevention. Let me know if you want to see physical therapist again for fall prevention.  Great to see you!

## 2014-04-18 NOTE — Telephone Encounter (Signed)
Potassium too high. Will stop losartan, stop salt substitutes. F/u in ofc next week-mid-week.  LM on home phone to stop losartan & salt substitutes.

## 2014-04-18 NOTE — Assessment & Plan Note (Signed)
Polymyositis. recmd daily walk & or exercises to strengthen LE. Prevent falls.

## 2014-04-21 DIAGNOSIS — M109 Gout, unspecified: Secondary | ICD-10-CM | POA: Diagnosis not present

## 2014-04-21 DIAGNOSIS — M3320 Polymyositis, organ involvement unspecified: Secondary | ICD-10-CM | POA: Diagnosis not present

## 2014-04-21 DIAGNOSIS — R5381 Other malaise: Secondary | ICD-10-CM | POA: Diagnosis not present

## 2014-04-21 DIAGNOSIS — I2510 Atherosclerotic heart disease of native coronary artery without angina pectoris: Secondary | ICD-10-CM | POA: Diagnosis not present

## 2014-04-22 ENCOUNTER — Telehealth: Payer: Self-pay | Admitting: Nurse Practitioner

## 2014-04-22 ENCOUNTER — Telehealth: Payer: Self-pay

## 2014-04-22 NOTE — Telephone Encounter (Signed)
pls call - speak with daughter, Rosanne Ashing. Ask if she received my phone message about stopping losartan and following up in office this week. Her potassium is getting too high-losartan can cause this. I want to see her 3 to 4 days after stopping losartan. She needs to stop salt substitutes also, as they are high in potassium.

## 2014-04-22 NOTE — Telephone Encounter (Signed)
Error

## 2014-04-22 NOTE — Telephone Encounter (Signed)
Poke with pt's daughter, advised message from [place]. Pt has an appt tomorrow.

## 2014-04-22 NOTE — Telephone Encounter (Signed)
Left message for pt to call back  °

## 2014-04-23 ENCOUNTER — Encounter: Payer: Self-pay | Admitting: Nurse Practitioner

## 2014-04-23 ENCOUNTER — Ambulatory Visit (INDEPENDENT_AMBULATORY_CARE_PROVIDER_SITE_OTHER): Payer: Medicare Other | Admitting: Nurse Practitioner

## 2014-04-23 ENCOUNTER — Telehealth: Payer: Self-pay | Admitting: *Deleted

## 2014-04-23 ENCOUNTER — Other Ambulatory Visit: Payer: Self-pay | Admitting: *Deleted

## 2014-04-23 VITALS — BP 160/77 | HR 77 | Temp 98.0°F | Ht 67.0 in | Wt 138.0 lb

## 2014-04-23 DIAGNOSIS — R609 Edema, unspecified: Secondary | ICD-10-CM

## 2014-04-23 DIAGNOSIS — E875 Hyperkalemia: Secondary | ICD-10-CM | POA: Diagnosis not present

## 2014-04-23 DIAGNOSIS — Z23 Encounter for immunization: Secondary | ICD-10-CM | POA: Diagnosis not present

## 2014-04-23 DIAGNOSIS — R600 Localized edema: Secondary | ICD-10-CM | POA: Insufficient documentation

## 2014-04-23 LAB — BASIC METABOLIC PANEL
BUN: 26 mg/dL — AB (ref 6–23)
CALCIUM: 9.4 mg/dL (ref 8.4–10.5)
CHLORIDE: 106 meq/L (ref 96–112)
CO2: 26 meq/L (ref 19–32)
CREATININE: 1.2 mg/dL (ref 0.4–1.2)
GFR: 47.42 mL/min — ABNORMAL LOW (ref >60.00)
GLUCOSE: 164 mg/dL — AB (ref 70–99)
Potassium: 4 mEq/L (ref 3.5–5.1)
Sodium: 138 mEq/L (ref 135–145)

## 2014-04-23 NOTE — Telephone Encounter (Signed)
[facility] with ref[facility] [id number][facility] rep stated that med question is for Dr. Johnsie Cancel. Called pt's daughter [name] back to let her know that med problem is being handled.

## 2014-04-23 NOTE — Telephone Encounter (Signed)
Patients daughter [name] returned call.

## 2014-04-23 NOTE — Patient Instructions (Signed)
Decrease Vitamin D to 4000 iu weekly.  Take lasix once daily as needed, evidenced by 3 lb weight gain in 24 hours or able to make dent over shin when pressed.   Continue sinus wash as needed based on nasal stuffiness, clearing throat frequently, or hoarse voice.  These symptoms may also be caused by reflux, so continue probiotics daily & prilosec.  Please return in 4 weeks to re-evaluate hoarse voice.  Great to see you!

## 2014-04-23 NOTE — Telephone Encounter (Addendum)
Patient's daughter asked that we [facility] today concerning medication question.

## 2014-04-23 NOTE — Assessment & Plan Note (Signed)
Had episode of burning in chest after late meal-woke from sleep, went away after eating piece of bread. Started probiotics.

## 2014-04-23 NOTE — Progress Notes (Signed)
Pre visit review using our clinic review tool, if applicable. No additional management support is needed unless otherwise documented below in the visit note. 

## 2014-04-23 NOTE — Progress Notes (Signed)
Subjective:     Sara Lynch is a 80 y.o. female presents for follow up of new hyperkalemia. She is accomanied by her daughter today. She is on losartan. Not taking NSAIDS or using salt substitutes.  I stopped  Losartan 1 week ago. Will check BMET today. No change in BP. Feels well. She has some LE edema on exam today-she is not taking lasix. She does not check weight daily. Instructed to check weight q24h & take lasix once daily as needed when wt increases by 3 pounds in 24 hrs or she is able to make dent over shin when pressed. Nasal congestion is better since using sinus rinses.  She started on probiotic due to waking during night w/"burning pain in chest" relieved by eating piece of bread. Still c/o hoarse voice in am. Will f/u in 1 mo to re-val.  The following portions of the patient's history were reviewed and updated as appropriate: allergies, current medications, past medical history, past social history, past surgical history and problem list.  Review of Systems Pertinent items are noted in HPI.    Objective:    BP 160/77  Pulse 77  Temp(Src) 98 F (36.7 C) (Temporal)  Ht 5\' 7"  (1.702 m)  Wt 138 lb (62.596 kg)  BMI 21.61 kg/m2  SpO2 98% BP 160/77  Pulse 77  Temp(Src) 98 F (36.7 C) (Temporal)  Ht 5\' 7"  (1.702 m)  Wt 138 lb (62.596 kg)  BMI 21.61 kg/m2  SpO2 98% General appearance: alert, cooperative, appears stated age and no distress Head: Normocephalic, without obvious abnormality, atraumatic Eyes: negative findings: lids and lashes normal and conjunctivae and sclerae normal Lungs: clear to auscultation bilaterally Heart: RRR, systolic murmur Extremities: edema +1 pitting feet to few inches above ankles.    Assessment:  1. Hyperkalemia - Basic metabolic panel  2. Need for influenza vaccination - Flu vaccine HIGH DOSE PF  3. Bilateral lower extremity edema  See problem list for complete A&P See pt instructions. F/u 4 wks.

## 2014-04-23 NOTE — Assessment & Plan Note (Signed)
+  1 pitting edema bilat LE ankles to few inches above ankle. Take lasix once daily when  More than 3 lb. Wt gain in 24 hrs or able to make dent over shin when pressed.

## 2014-04-23 NOTE — Assessment & Plan Note (Signed)
Potassium was 5.3 last week: not using NSAIDS or potassium salt substitutes. Stopped losartan. No increase in BP. Feels OK Will check BMET today.

## 2014-04-23 NOTE — Telephone Encounter (Signed)
Patients daughter called stating that [facility] [place] to call them to clarify rx for nitro. I called [facility] and they were wanting to change the quantity to #180 for a 90 day supply. Verbal order given to change quantity.

## 2014-04-24 ENCOUNTER — Other Ambulatory Visit: Payer: Self-pay

## 2014-04-24 ENCOUNTER — Telehealth: Payer: Self-pay

## 2014-04-24 ENCOUNTER — Telehealth: Payer: Self-pay | Admitting: Nurse Practitioner

## 2014-04-24 MED ORDER — NITROGLYCERIN 0.4 MG/HR TD PT24
0.4000 mg | MEDICATED_PATCH | Freq: Every day | TRANSDERMAL | Status: DC
Start: 2014-04-24 — End: 2014-09-01

## 2014-04-24 NOTE — Telephone Encounter (Signed)
Left detailed message on Teresa's vm. Dorie Rank to return call if she has questions.

## 2014-04-24 NOTE — Telephone Encounter (Signed)
No. Losartan is the cause of her high potassium. Do not re-start. Monitor weight & legs & take lasix as needed.

## 2014-04-24 NOTE — Telephone Encounter (Signed)
Left detailed message on Sara Lynch's vm with lab results. Per DPR. Advised that if any questions give office a cb.

## 2014-04-24 NOTE — Telephone Encounter (Signed)
pls call pt:May need to speak with daughter, Rosanne Ashing  Advise potassium is back to normal. I will see her in 4 weeks.

## 2014-04-24 NOTE — Telephone Encounter (Signed)
Pt's daughter calling wanting to know if pt should continue taking her Losartan since her potassium is back to normal. Please advise.

## 2014-05-19 ENCOUNTER — Encounter: Payer: Self-pay | Admitting: Family Medicine

## 2014-05-19 ENCOUNTER — Ambulatory Visit (INDEPENDENT_AMBULATORY_CARE_PROVIDER_SITE_OTHER): Payer: Medicare Other | Admitting: Family Medicine

## 2014-05-19 VITALS — BP 124/78 | HR 72 | Temp 97.2°F | Ht 67.0 in | Wt 136.0 lb

## 2014-05-19 DIAGNOSIS — R350 Frequency of micturition: Secondary | ICD-10-CM

## 2014-05-19 DIAGNOSIS — R739 Hyperglycemia, unspecified: Secondary | ICD-10-CM | POA: Diagnosis not present

## 2014-05-19 DIAGNOSIS — N183 Chronic kidney disease, stage 3 unspecified: Secondary | ICD-10-CM

## 2014-05-19 DIAGNOSIS — N3001 Acute cystitis with hematuria: Secondary | ICD-10-CM

## 2014-05-19 DIAGNOSIS — R81 Glycosuria: Secondary | ICD-10-CM

## 2014-05-19 DIAGNOSIS — I6529 Occlusion and stenosis of unspecified carotid artery: Secondary | ICD-10-CM | POA: Diagnosis not present

## 2014-05-19 DIAGNOSIS — K5900 Constipation, unspecified: Secondary | ICD-10-CM

## 2014-05-19 DIAGNOSIS — N2889 Other specified disorders of kidney and ureter: Secondary | ICD-10-CM

## 2014-05-19 DIAGNOSIS — N3000 Acute cystitis without hematuria: Secondary | ICD-10-CM | POA: Diagnosis not present

## 2014-05-19 LAB — POCT URINALYSIS DIPSTICK
GLUCOSE UA: 100
NITRITE UA: NEGATIVE
PH UA: 6
Protein, UA: 100
Spec Grav, UA: 1.015
Urobilinogen, UA: 4

## 2014-05-19 LAB — BASIC METABOLIC PANEL
BUN: 34 mg/dL — ABNORMAL HIGH (ref 6–23)
CALCIUM: 9.1 mg/dL (ref 8.4–10.5)
CO2: 29 mEq/L (ref 19–32)
Chloride: 100 mEq/L (ref 96–112)
Creatinine, Ser: 1.6 mg/dL — ABNORMAL HIGH (ref 0.4–1.2)
GFR: 32.72 mL/min — AB (ref >60.00)
GLUCOSE: 150 mg/dL — AB (ref 70–99)
Potassium: 5.1 mEq/L (ref 3.5–5.1)
Sodium: 135 mEq/L (ref 135–145)

## 2014-05-19 LAB — HEMOGLOBIN A1C: HEMOGLOBIN A1C: 6.2 % (ref 4.6–6.5)

## 2014-05-19 LAB — GLUCOSE, POCT (MANUAL RESULT ENTRY): POC Glucose: 202 mg/dl — AB (ref 70–99)

## 2014-05-19 MED ORDER — PIOGLITAZONE HCL 30 MG PO TABS: 30.0000 mg | ORAL_TABLET | Freq: Every day | ORAL | Status: DC

## 2014-05-19 MED ORDER — CIPROFLOXACIN HCL 250 MG PO TABS: 250.0000 mg | ORAL_TABLET | Freq: Two times a day (BID) | ORAL | Status: DC

## 2014-05-19 NOTE — Patient Instructions (Signed)
Buy generic for Senakot S tabs: take 2 tabs every night at bedtime. You may continue to use miralax 1 capful once a day as needed.

## 2014-05-19 NOTE — Progress Notes (Signed)
OFFICE NOTE  05/19/2014  CC: urinary frequency HPI: Patient is a 80 y.o. Caucasian female who is here for 3-4 d of increased urinary frequency, some hurting in urethra after she finishes peeing, some urge incontinence lately, fatigued.  No AZO has been taken. Pain in LB on both sides.   Some clammy periods but no fever/chills.  She is cold natured chronically, worse the last couple of weeks. No polydipsia or polyphagia.  Mild blurry vision coming and going lately.  Wants to sleep "all the time" lately. Last ate 3 hours ago: BF. Was a bit disoriented this morning per daughter.    Pertinent PMH:  Past medical, surgical, social, and family history reviewed and no changes are noted since last office visit.  MEDS:  Outpatient Prescriptions Prior to Visit  Medication Sig Dispense Refill  . acetaminophen (TYLENOL) 500 MG tablet Take 500 mg by mouth.      Marland Kitchen allopurinol (ZYLOPRIM) 100 MG tablet Take 200 mg by mouth daily.      Marland Kitchen amLODipine (NORVASC) 2.5 MG tablet Take 1 tablet (2.5 mg total) by mouth daily.  90 tablet  1  . aspirin 81 MG tablet Take 81 mg by mouth daily.      . carvedilol (COREG) 12.5 MG tablet Take 1 tablet (12.5 mg total) by mouth 2 (two) times daily with a meal.  180 tablet  1  . Cholecalciferol (VITAMIN D-3 PO) Take by mouth.      . clopidogrel (PLAVIX) 75 MG tablet Take 1 tablet (75 mg total) by mouth daily with breakfast.  90 tablet  1  . folic acid (FOLVITE) 1 MG tablet Take 1 mg by mouth daily.      . furosemide (LASIX) 40 MG tablet Take 1 tablet (40 mg total) by mouth as needed.  90 tablet  1  . losartan (COZAAR) 25 MG tablet Take 1 tablet (25 mg total) by mouth daily.  90 tablet  1  . methotrexate 2.5 MG tablet Take by mouth. On wednesdays Take three pills a week      . nitroGLYCERIN (NITRODUR - DOSED IN MG/24 HR) 0.4 mg/hr patch Place 1 patch (0.4 mg total) onto the skin daily.  90 patch  1  . omeprazole (PRILOSEC) 20 MG capsule Take 20 mg by mouth daily.      .  pravastatin (PRAVACHOL) 40 MG tablet Take 40 mg by mouth daily.       No facility-administered medications prior to visit.    PE: Blood pressure 124/78, pulse 72, temperature 97.2 F (36.2 C), temperature source Temporal, height 5\' 7"  (1.702 m), weight 136 lb (61.689 kg), SpO2 98.00%. Gen: Alert, well appearing.  Patient is oriented to person, place, time, and situation. JJO:ACZY: no injection, icteris, swelling, or exudate.  EOMI, PERRLA. Mouth: lips without lesion/swelling.  Oral mucosa pink and moist. Oropharynx without erythema, exudate, or swelling.  CV: RRR, 6-0/6 holosystolic murmur obscuring S1 and S2 Chest is clear, no wheezing or rales. Normal symmetric air entry throughout both lung fields. No chest wall deformities or tenderness. BACK: no CVA TTP. Flanks and abd: nontender.  LAB: cap glucose today was 202 CC UA today showed 100 mg/dl gluc, trace ketones, moderate blood, 100 mg/dl protein, large LEU.  IMPRESSION AND PLAN:  1) UTI: cipro 250mg  bid x 5d. Sent urine for c/s.  2) Hyperglycemia: suspect new dx DM 2. HbA1c today.   Start pioglitazone 30mg , 1/2 tab qd.  3) Constipation: cont miralax qd prn.  Add generic  for senakot S 2 tabs qhs.  4) CRI, stage 4: BMET today.  An After Visit Summary was printed and given to the patient.  FOLLOW UP: 1 wk

## 2014-05-19 NOTE — Progress Notes (Signed)
Pre visit review using our clinic review tool, if applicable. No additional management support is needed unless otherwise documented below in the visit note. 

## 2014-05-21 LAB — URINE CULTURE: Colony Count: >=100000

## 2014-05-23 ENCOUNTER — Other Ambulatory Visit (INDEPENDENT_AMBULATORY_CARE_PROVIDER_SITE_OTHER): Payer: Medicare Other

## 2014-05-23 DIAGNOSIS — R739 Hyperglycemia, unspecified: Secondary | ICD-10-CM

## 2014-05-23 LAB — BASIC METABOLIC PANEL
BUN: 31 mg/dL — ABNORMAL HIGH (ref 6–23)
CO2: 22 meq/L (ref 19–32)
CREATININE: 1.4 mg/dL — AB (ref 0.4–1.2)
Calcium: 9.1 mg/dL (ref 8.4–10.5)
Chloride: 107 mEq/L (ref 96–112)
GFR: 37.85 mL/min — ABNORMAL LOW (ref >60.00)
GLUCOSE: 126 mg/dL — AB (ref 70–99)
Potassium: 4.4 mEq/L (ref 3.5–5.1)
Sodium: 139 mEq/L (ref 135–145)

## 2014-05-26 ENCOUNTER — Encounter: Payer: Self-pay | Admitting: Nurse Practitioner

## 2014-05-26 ENCOUNTER — Ambulatory Visit (INDEPENDENT_AMBULATORY_CARE_PROVIDER_SITE_OTHER): Payer: Medicare Other | Admitting: Nurse Practitioner

## 2014-05-26 ENCOUNTER — Ambulatory Visit: Payer: Medicare Other | Admitting: Family Medicine

## 2014-05-26 VITALS — BP 160/80 | HR 71 | Temp 97.6°F | Ht 67.0 in | Wt 137.0 lb

## 2014-05-26 DIAGNOSIS — I6529 Occlusion and stenosis of unspecified carotid artery: Secondary | ICD-10-CM | POA: Diagnosis not present

## 2014-05-26 DIAGNOSIS — Z87440 Personal history of urinary (tract) infections: Secondary | ICD-10-CM | POA: Diagnosis not present

## 2014-05-26 DIAGNOSIS — R748 Abnormal levels of other serum enzymes: Secondary | ICD-10-CM

## 2014-05-26 DIAGNOSIS — R7303 Prediabetes: Secondary | ICD-10-CM

## 2014-05-26 DIAGNOSIS — R7309 Other abnormal glucose: Secondary | ICD-10-CM

## 2014-05-26 DIAGNOSIS — R7989 Other specified abnormal findings of blood chemistry: Secondary | ICD-10-CM

## 2014-05-26 NOTE — Patient Instructions (Signed)
Cut out refined sugar: anything that is sweet when you eat or drink it, except fresh fruit.  Cut out white bread, rolls, biscuits, bagels, muffins, pasta and cereals. Breads & cereals that have 4 gm or more of fiber per serving are good. Whole wheat pasta, brown rice and quinoa are good.  Check fasting sugar (before food or drink) twice weekly for 1 month, then weekly until you return. Call me if sugar is over 127 and we will start diabetes treatment.  I will call regarding urine results.  Return in 3 mos. I will check diabetes screen & kidney function.

## 2014-05-26 NOTE — Progress Notes (Signed)
Subjective:     Sara Lynch is a 80 y.o. female presents to follow up for recent UTI, elevated A1C, & constipation. She is accompanied by her daughter today. She was seen in ofc 1 week ago presenting w/blurred vision, nocturia, & constipation. Urine culture revealed UTI. She was treated w/cipro, stool softener & miralax. All symptoms have resolved.  A1C was elevated at 6.2, FBS was 126, serum creat was elevated at 1.6, but slight improvement since treated for UTI-1.4. Pt reports she rarely takes lasix; but takes allopurinol & MTX daily for gout & polymyalgia rheumatica. She has been treated for HTN for many years-current meds are coreg, lisinopril (d/c'd losartan due to hyperkalemia), norvasc. Constipation likely r/t travel/out of routine.  I have counseled her to take FBS twice weekly & call if over 126; cut out refined sugars & refined grains. I will check A1C again in 3 mos. & consider decreasing allopurinol to 100 mg daily if uric acid below 6.0. Urine cx today.  The following portions of the patient's history were reviewed and updated as appropriate: allergies, current medications, past medical history, past social history, past surgical history and problem list.  Review of Systems Pertinent items are noted in HPI.    Objective:    BP 160/80 mmHg  Pulse 71  Temp(Src) 97.6 F (36.4 C) (Temporal)  Ht 5\' 7"  (1.702 m)  Wt 137 lb (62.143 kg)  BMI 21.45 kg/m2  SpO2 97% BP 160/80 mmHg  Pulse 71  Temp(Src) 97.6 F (36.4 C) (Temporal)  Ht 5\' 7"  (1.702 m)  Wt 137 lb (62.143 kg)  BMI 21.45 kg/m2  SpO2 97% General appearance: alert, cooperative, appears stated age and no distress Head: Normocephalic, without obvious abnormality, atraumatic Eyes: negative findings: lids and lashes normal and conjunctivae and sclerae normal Lungs: clear to auscultation bilaterally Heart: regular rate and rhythm, S1, S2 normal, no murmur, click, rub or gallop Extremities: extremities normal, atraumatic, no  cyanosis or edema Pulses: 2+ and symmetric    Assessment:     1. Recent urinary tract infection All symptoms resolved (nocturia, blurred vision) - Urine culture  2. Prediabetes Check FBS at home 2/week Cut out refined sugar & grains. See patient instructions for complete plan. F/u 44mos (A1C) or sooner if FBS over 126.  3. Constipation Resolved Continue probiotics 3 days/week.  4. Elevated serum creat Decrease allopurinol to 100 mg qd Check BMET & uric acid at f/u

## 2014-05-26 NOTE — Progress Notes (Signed)
Pre visit review using our clinic review tool, if applicable. No additional management support is needed unless otherwise documented below in the visit note. 

## 2014-05-27 ENCOUNTER — Telehealth: Payer: Self-pay | Admitting: *Deleted

## 2014-05-27 DIAGNOSIS — R7303 Prediabetes: Secondary | ICD-10-CM | POA: Insufficient documentation

## 2014-05-27 DIAGNOSIS — R7989 Other specified abnormal findings of blood chemistry: Secondary | ICD-10-CM | POA: Insufficient documentation

## 2014-05-27 NOTE — Telephone Encounter (Signed)
-----   Message from Irene Pap, NP sent at 05/27/2014  9:23 AM EST ----- pls call [facility] medical to get last set of labs for Irwin. She saw Dr Trudie Reed. I'm looking for uric acid & sed rate levels.

## 2014-05-27 NOTE — Assessment & Plan Note (Signed)
Decrease allopurinol to 100 mg qd Check uric acid at f/u visit No flare in years

## 2014-05-27 NOTE — Telephone Encounter (Signed)
Faxed over request for lab results.

## 2014-05-28 ENCOUNTER — Telehealth: Payer: Self-pay | Admitting: Nurse Practitioner

## 2014-05-28 DIAGNOSIS — R8271 Bacteriuria: Secondary | ICD-10-CM

## 2014-05-28 LAB — URINE CULTURE: Colony Count: 30000

## 2014-05-28 MED ORDER — OMEPRAZOLE 20 MG PO CPDR: 20.0000 mg | DELAYED_RELEASE_CAPSULE | Freq: Every day | ORAL | Status: DC

## 2014-05-28 MED ORDER — CIPROFLOXACIN HCL 250 MG PO TABS: 250.0000 mg | ORAL_TABLET | Freq: Two times a day (BID) | ORAL | Status: DC

## 2014-05-28 NOTE — Telephone Encounter (Signed)
Patient needs Omeprazole 20 MG Rx sent to [facility] 90 day supply

## 2014-05-28 NOTE — Telephone Encounter (Signed)
Spoke with [name] and advised message from [place]. I sent in a 90 day supply of her Omeprazole also.

## 2014-05-28 NOTE — Telephone Encounter (Signed)
pls call pt or OK to speak with daughter, Clarene Critchley. Advise, still some bacteria growth in urine, so I have prescribed 3 more days of cipro. I will see her at f/u appt already scheduled.

## 2014-05-29 ENCOUNTER — Telehealth: Payer: Self-pay | Admitting: Nurse Practitioner

## 2014-05-29 NOTE — Telephone Encounter (Signed)
Left message for pt to call back  °

## 2014-05-29 NOTE — Telephone Encounter (Signed)
pls call pt: Advise Pt or daughter, Clarene Critchley Decrease allopurinol to 100 mg daily. I reviewed labs from Dr Trudie Reed.

## 2014-05-30 NOTE — Telephone Encounter (Signed)
Spoke with pt, advised message from daughter. She understood. She also states her mother wants to control her diet regarding her diabetes. She is eliminating most of her foods and is concerned about significant weight loss. She weighed in at 129 lb this morning. Please call daughter.

## 2014-05-30 NOTE — Telephone Encounter (Signed)
If daughter Rosanne Ashing calls back. Suggest she show meal plan I gave to her, to her mom. All foods on plan are safe for her. She does not need to resctrict any quantities.

## 2014-06-03 ENCOUNTER — Telehealth: Payer: Self-pay | Admitting: *Deleted

## 2014-06-03 DIAGNOSIS — Z86718 Personal history of other venous thrombosis and embolism: Secondary | ICD-10-CM

## 2014-06-03 DIAGNOSIS — R7989 Other specified abnormal findings of blood chemistry: Secondary | ICD-10-CM

## 2014-06-03 NOTE — Telephone Encounter (Signed)
Patient notified

## 2014-06-03 NOTE — Telephone Encounter (Signed)
Done

## 2014-06-03 NOTE — Telephone Encounter (Signed)
pls call pt: Advise plavix can cause elevated creatinine. Thank her for bringing her concern to my attention. I would like Dr Kyla Balzarine input. He is prescribing plavix. I will send him a note, she should hear back from [place] in a day or so.

## 2014-06-03 NOTE — Telephone Encounter (Signed)
Patient left vm concerning medication clopidogrel. Patient stated that in the past while taking Plavix she had kidney failure. She was told by her pcp at the time that kidney problems were due to Plavix and was taken off the med. Patient wants reassurance that the clopidogrel that she taking now isn't causing her to have problems with her kidneys again. Please advise?

## 2014-06-04 MED ORDER — ASPIRIN EC 325 MG PO TBEC: 325.0000 mg | DELAYED_RELEASE_TABLET | Freq: Every day | ORAL | Status: DC

## 2014-06-04 NOTE — Telephone Encounter (Signed)
pls call pt: Advise I spoke w/Dr Johnsie Cancel. OK to stop plavix & start 325 mg enteric coated asirin daily. Take with food. Pls schedule lab appt. For 12/17 or after. I will check kidney function again.

## 2014-06-05 NOTE — Telephone Encounter (Signed)
Patient notified. Call Sara Lynch to make appt.

## 2014-06-06 NOTE — Telephone Encounter (Signed)
Appt scheduled for ov and lab on 12/17. Did you wants pt to stop taking the 81 mg aspirin also?

## 2014-06-06 NOTE — Telephone Encounter (Signed)
Patient aware.

## 2014-06-06 NOTE — Telephone Encounter (Signed)
Yes, 325 mg aspirin replaces 81 mg aspirin.

## 2014-06-17 ENCOUNTER — Telehealth: Payer: Self-pay

## 2014-06-17 ENCOUNTER — Other Ambulatory Visit: Payer: Self-pay | Admitting: Nurse Practitioner

## 2014-06-17 DIAGNOSIS — Z86718 Personal history of other venous thrombosis and embolism: Secondary | ICD-10-CM

## 2014-06-17 MED ORDER — ASPIRIN EC 325 MG PO TBEC: 325.0000 mg | DELAYED_RELEASE_TABLET | Freq: Every day | ORAL | Status: DC

## 2014-06-17 NOTE — Telephone Encounter (Signed)
Pt called and wanted to know if she should be taking 325 mg of aspirin. I see there is a phone note stating this but pt only received a 30 day supply. Is she supposed to take this everyday or for only 30 days. She uses mail order pharmacy, 90 day supply

## 2014-07-10 ENCOUNTER — Ambulatory Visit (INDEPENDENT_AMBULATORY_CARE_PROVIDER_SITE_OTHER): Payer: Medicare Other | Admitting: Nurse Practitioner

## 2014-07-10 ENCOUNTER — Telehealth: Payer: Self-pay | Admitting: Nurse Practitioner

## 2014-07-10 ENCOUNTER — Encounter: Payer: Self-pay | Admitting: Nurse Practitioner

## 2014-07-10 VITALS — BP 132/64 | HR 67 | Temp 98.0°F | Ht 67.0 in | Wt 132.0 lb

## 2014-07-10 DIAGNOSIS — R7989 Other specified abnormal findings of blood chemistry: Secondary | ICD-10-CM

## 2014-07-10 DIAGNOSIS — R748 Abnormal levels of other serum enzymes: Secondary | ICD-10-CM | POA: Diagnosis not present

## 2014-07-10 DIAGNOSIS — I6529 Occlusion and stenosis of unspecified carotid artery: Secondary | ICD-10-CM

## 2014-07-10 DIAGNOSIS — R634 Abnormal weight loss: Secondary | ICD-10-CM | POA: Diagnosis not present

## 2014-07-10 DIAGNOSIS — L989 Disorder of the skin and subcutaneous tissue, unspecified: Secondary | ICD-10-CM

## 2014-07-10 LAB — BASIC METABOLIC PANEL
BUN: 27 mg/dL — AB (ref 6–23)
CALCIUM: 9.8 mg/dL (ref 8.4–10.5)
CHLORIDE: 107 meq/L (ref 96–112)
CO2: 25 mEq/L (ref 19–32)
CREATININE: 1.1 mg/dL (ref 0.4–1.2)
GFR: 50.93 mL/min — AB (ref >60.00)
Glucose, Bld: 106 mg/dL — ABNORMAL HIGH (ref 70–99)
Potassium: 4.3 mEq/L (ref 3.5–5.1)
Sodium: 141 mEq/L (ref 135–145)

## 2014-07-10 NOTE — Progress Notes (Signed)
Pre visit review using our clinic review tool, if applicable. No additional management support is needed unless otherwise documented below in the visit note. 

## 2014-07-10 NOTE — Progress Notes (Signed)
Subjective:     Sara Lynch is a 80 y.o. female presents for eval of skin lesion on arm, "red spots" on both arms-thinks from too much aspirin, weight loss, wants to review blood sugars, and repeat Bmet due to elevated creatinine. She is accompanied by her daughter today, with whom she lives. Lesion: R upper arm, Pt noticed raised lesion several weeks ago. [place] off few days ago. Now growing back. Not painful or itchy. Wt loss: she cut out refined sugar due to elevated FBS & A1C. Wt loss is likely reflection of this change. Feels well. Red spots: noticed spots on arms since stopped plavis & started 325mg  ASA qd; several, few weeks ago, not so many today. No pain or pruritis. Denies bleeding gums or nose bleeds. BS: home readings 85 -109. She has cut out refined sugar. Recent elevated creat: I decreased allopurinol to 100 mg qd & stopped plavix as pt reported that she "went into kidney failure" on plavix in past. I consulted Cardiology, who agreed for her to stop plavix & start 325 mg ASA qd.   The following portions of the patient's history were reviewed and updated as appropriate: allergies, current medications, past medical history, past social history, past surgical history and problem list.  Review of Systems Constitutional: negative for fatigue and night sweats Eyes: negative for visual disturbance Respiratory: negative for cough Cardiovascular: negative for chest pressure/discomfort, lower extremity edema and palpitations Gastrointestinal: negative for abdominal pain, change in bowel habits and reflux symptoms Endocrine: negative for diabetic symptoms including blurry vision, polydipsia, polyphagia and polyuria    Objective:    BP 132/64 mmHg  Pulse 67  Temp(Src) 98 F (36.7 C) (Oral)  Ht 5\' 7"  (1.702 m)  Wt 132 lb (59.875 kg)  BMI 20.67 kg/m2  SpO2 97% General appearance: alert, cooperative, appears stated age and no distress Head: Normocephalic, without obvious abnormality,  atraumatic Eyes: negative findings: lids and lashes normal and conjunctivae and sclerae normal Neck: no adenopathy, no carotid bruit, supple, symmetrical, trachea midline and thyroid not enlarged, symmetric, no tenderness/mass/nodules Lungs: clear to auscultation bilaterally Heart: regular rate and rhythm, S1, S2 normal, no murmur, click, rub or gallop Extremities: extremities normal, atraumatic, no cyanosis or edema Pulses: 2+ and symmetric Skin: No petechiae. Lesion R upper arm: looks like irritated seborrheic keratosis. approx 1 cm    Assessment:Plan   1. Skin lesion, new Dd: IRRITATED SEBORRHEIC KERATOSIS, KERATOTIC HORN - Ambulatory referral to Dermatology  2. Elevated serum creatinine, new Likely medication related - Basic Metabolic Panel (BMET)  3. Loss of weight, new Likely secondary to cutting out refined sugars 5 lb wt loss Drink 1 can Boost daily-blend fresh berries into drink to add nutrients. F/u 6 weeks to monitor, will require further w/u if continues to loose.

## 2014-07-10 NOTE — Telephone Encounter (Signed)
Kidney function has normalized since stopping plavix & cutting back allopurinol.

## 2014-07-10 NOTE — Patient Instructions (Signed)
My office will call with lab results.  It is OK to decrease aspirin to 5 days/week if you are having nose bleeds or gums are bleeding when brush teeth.   See dermatology for lesion on arm.  Blood sugars look great! I will check A1C in late Jan or Feb.  Consider supplementing diet with Boost or Ensure-1 can daily. OK to blend with fresh or frozen fruit.  See you in 6 weeks.  Merry Christmas!

## 2014-07-14 NOTE — Telephone Encounter (Signed)
Spoke with daughter advised lab results. Pt understood.

## 2014-07-20 DIAGNOSIS — R634 Abnormal weight loss: Secondary | ICD-10-CM | POA: Insufficient documentation

## 2014-07-30 DIAGNOSIS — M1009 Idiopathic gout, multiple sites: Secondary | ICD-10-CM | POA: Diagnosis not present

## 2014-07-30 DIAGNOSIS — Z79899 Other long term (current) drug therapy: Secondary | ICD-10-CM | POA: Diagnosis not present

## 2014-07-30 DIAGNOSIS — M3322 Polymyositis with myopathy: Secondary | ICD-10-CM | POA: Diagnosis not present

## 2014-08-04 ENCOUNTER — Ambulatory Visit (HOSPITAL_COMMUNITY): Payer: Medicare Other | Attending: Cardiovascular Disease | Admitting: Cardiology

## 2014-08-04 DIAGNOSIS — I350 Nonrheumatic aortic (valve) stenosis: Secondary | ICD-10-CM

## 2014-08-04 DIAGNOSIS — I252 Old myocardial infarction: Secondary | ICD-10-CM | POA: Insufficient documentation

## 2014-08-04 DIAGNOSIS — E785 Hyperlipidemia, unspecified: Secondary | ICD-10-CM | POA: Insufficient documentation

## 2014-08-04 DIAGNOSIS — I080 Rheumatic disorders of both mitral and aortic valves: Secondary | ICD-10-CM | POA: Diagnosis not present

## 2014-08-04 NOTE — Progress Notes (Signed)
Echo performed. 

## 2014-08-31 NOTE — Progress Notes (Signed)
Patient ID: Sara Lynch, female   DOB: Jan 02, 1929, 81 y.o.   MRN: [id number] 81 y.o.  referred from [place] affiliated with [facility]. Her daughter Sara Lynch is her health care power of attorney and with her today. The patient has advancing dementia with LE weakness from polymyositis and moved to [facility] to live with her oldest daughter. She has CAD that is ill defined Cath in [id number] complicated by embolic shower with stroke and she was cautioned never to have another. She has chronic chest pain. Admitted to [facility] April with MI This occurred 2 days after starting Ranexa Daughter thinks some of her "angina" is anxiety driven. Majority of episodes at night. She is using a nitro patch during day.  She has bilateral LE weakness from polymyositis and PT/rehab is limited by chest pain. It is obvious from talking to her and daughter that dementia and memory are getting worse ( that's why she moved to [place] to be with daughter) She does not want to be DNR and daughter knows she can withdraw care if situation hopeless   01/21/14  Nuclear study normal with no ischemia  EF 76%  Doppler with bilateral 40-59% ICA stenosis   Still with anginal sounding ;pain with exertion.  Improved with nitro patch at night  F/U echo for AS murmur  08/04/14  Showed mild AS and normal EF  Reviewed  Needs f/u carotids 6/16  Refill for nitro given     ROS: Denies fever, malais, weight loss, blurry vision, decreased visual acuity, cough, sputum, SOB, hemoptysis, pleuritic pain, palpitaitons, heartburn, abdominal pain, melena, lower extremity edema, claudication, or rash.  All other systems reviewed and negative  General: Affect appropriate Healthy:  appears stated age 81: normal Neck supple with no adenopathy JVP normal no bruits no thyromegaly Lungs clear with no wheezing and good diaphragmatic motion Heart:  S1/S2 AS murmur, no rub, gallop or click PMI normal Abdomen: benighn, BS positve, no tenderness,  no AAA no bruit.  No HSM or HJR Distal pulses intact with no bruits No edema Neuro non-focal Skin warm and dry No muscular weakness   Current Outpatient Prescriptions  Medication Sig Dispense Refill  . acetaminophen (TYLENOL) 500 MG tablet Take 500 mg by mouth.    Marland Kitchen allopurinol (ZYLOPRIM) 100 MG tablet Take 100 mg by mouth daily.    Marland Kitchen amLODipine (NORVASC) 2.5 MG tablet Take 1 tablet (2.5 mg total) by mouth daily. 90 tablet 1  . aspirin EC 325 MG tablet Take 1 tablet (325 mg total) by mouth daily. 90 tablet 2  . carvedilol (COREG) 12.5 MG tablet Take 1 tablet (12.5 mg total) by mouth 2 (two) times daily with a meal. 180 tablet 1  . Cholecalciferol (VITAMIN D-3 PO) Take by mouth.    . folic acid (FOLVITE) 1 MG tablet Take 1 mg by mouth daily.    . furosemide (LASIX) 40 MG tablet Take 40 mg by mouth as needed.     Marland Kitchen losartan (COZAAR) 25 MG tablet Take 1 tablet (25 mg total) by mouth daily. 90 tablet 1  . methotrexate 2.5 MG tablet Take by mouth. On wednesdays Take three pills a week    . omeprazole (PRILOSEC) 20 MG capsule Take 1 capsule (20 mg total) by mouth daily. 90 capsule 1  . pravastatin (PRAVACHOL) 40 MG tablet Take 40 mg by mouth daily.     No current facility-administered medications for this visit.    Allergies  Alendronate; Codeine; Enalapril maleate; Fosamax; Oxycodone;  and Penicillins  Electrocardiogram:  03/03/14  SR rate 66 LVH  Septal infarct   Assessment and Plan

## 2014-09-01 ENCOUNTER — Ambulatory Visit (INDEPENDENT_AMBULATORY_CARE_PROVIDER_SITE_OTHER): Payer: Medicare Other | Admitting: Cardiovascular Disease

## 2014-09-01 ENCOUNTER — Encounter: Payer: Self-pay | Admitting: Cardiovascular Disease

## 2014-09-01 VITALS — BP 132/60 | HR 76 | Ht 67.0 in | Wt 134.0 lb

## 2014-09-01 DIAGNOSIS — R072 Precordial pain: Secondary | ICD-10-CM | POA: Diagnosis not present

## 2014-09-01 DIAGNOSIS — E785 Hyperlipidemia, unspecified: Secondary | ICD-10-CM

## 2014-09-01 DIAGNOSIS — R0989 Other specified symptoms and signs involving the circulatory and respiratory systems: Secondary | ICD-10-CM

## 2014-09-01 DIAGNOSIS — I200 Unstable angina: Secondary | ICD-10-CM | POA: Insufficient documentation

## 2014-09-01 DIAGNOSIS — R011 Cardiac murmur, unspecified: Secondary | ICD-10-CM

## 2014-09-01 MED ORDER — PRAVASTATIN SODIUM 40 MG PO TABS: 40.0000 mg | ORAL_TABLET | Freq: Every day | ORAL | Status: DC

## 2014-09-01 MED ORDER — NITROGLYCERIN 0.4 MG SL SUBL: 0.4000 mg | SUBLINGUAL_TABLET | SUBLINGUAL | Status: DC | PRN

## 2014-09-01 NOTE — Assessment & Plan Note (Signed)
Moderate carotid disease  F/u duplex 6/16  No change in bruit and referred murmur

## 2014-09-01 NOTE — Assessment & Plan Note (Signed)
Cholesterol is at goal.  Continue current dose of statin and diet Rx.  No myalgias or side effects.  F/U  LFT's in 6 months. Lab Results  Component Value Date   LDLCALC 87 04/18/2014

## 2014-09-01 NOTE — Assessment & Plan Note (Signed)
Mild AS normal EF f/u echo in a year

## 2014-09-01 NOTE — Assessment & Plan Note (Signed)
Off nitro patch  PRN SL given myovue 2015 normal Given age have not been aggressive about cath  Anxiety playing a role as well

## 2014-09-01 NOTE — Patient Instructions (Signed)
Your physician wants you to follow-up in:   [place] will receive a reminder letter in the mail two months in advance. If you don't receive a letter, please call our office to schedule the follow-up appointment. Your physician recommends that you continue on your current medications as directed. Please refer to the Current Medication list given to you today.  Your physician has requested that you have a carotid duplex. This test is an ultrasound of the carotid arteries in your neck. It looks at blood flow through these arteries that supply the brain with blood. Allow one hour for this exam. There are no restrictions or special instructions. DUE IN  June  2016

## 2014-09-10 ENCOUNTER — Ambulatory Visit: Payer: Medicare Other | Admitting: Nurse Practitioner

## 2014-09-10 ENCOUNTER — Encounter: Payer: Self-pay | Admitting: Nurse Practitioner

## 2014-09-10 ENCOUNTER — Ambulatory Visit (INDEPENDENT_AMBULATORY_CARE_PROVIDER_SITE_OTHER): Payer: Medicare Other | Admitting: Nurse Practitioner

## 2014-09-10 VITALS — BP 102/62 | HR 73 | Temp 97.7°F | Ht 67.0 in | Wt 131.0 lb

## 2014-09-10 DIAGNOSIS — R7303 Prediabetes: Secondary | ICD-10-CM

## 2014-09-10 DIAGNOSIS — L989 Disorder of the skin and subcutaneous tissue, unspecified: Secondary | ICD-10-CM | POA: Diagnosis not present

## 2014-09-10 DIAGNOSIS — Z86718 Personal history of other venous thrombosis and embolism: Secondary | ICD-10-CM | POA: Diagnosis not present

## 2014-09-10 DIAGNOSIS — R634 Abnormal weight loss: Secondary | ICD-10-CM | POA: Diagnosis not present

## 2014-09-10 DIAGNOSIS — I952 Hypotension due to drugs: Secondary | ICD-10-CM

## 2014-09-10 DIAGNOSIS — R7309 Other abnormal glucose: Secondary | ICD-10-CM

## 2014-09-10 DIAGNOSIS — H9313 Tinnitus, bilateral: Secondary | ICD-10-CM | POA: Diagnosis not present

## 2014-09-10 LAB — HEMOGLOBIN A1C: Hgb A1c MFr Bld: 6.1 % (ref 4.6–6.5)

## 2014-09-10 MED ORDER — ASPIRIN EC 325 MG PO TBEC: 325.0000 mg | DELAYED_RELEASE_TABLET | ORAL | Status: DC

## 2014-09-10 NOTE — Progress Notes (Signed)
Pre visit review using our clinic review tool, if applicable. No additional management support is needed unless otherwise documented below in the visit note. 

## 2014-09-10 NOTE — Patient Instructions (Signed)
Decrease daily enteric coated aspirin to every other day. Hopefully ringing in ears will stop. See me in 3 weeks to re-evaluate.  Check blood pressure 3 times-once each day for 3 days at same time every day. If under 130/85, stop amlodipine.  Use lasix only when you have gained 2 pounds in 24 hours. If one leg is swollen, take frequent walks during day & increase water intake.  My office will call with lab results.  Very nice to see you!

## 2014-09-10 NOTE — Assessment & Plan Note (Signed)
Decrease dose to qod

## 2014-09-10 NOTE — Progress Notes (Signed)
Subjective:     Sara Lynch is a 81 y.o. female presents for f/u of R arm skin lesion, prediabetes, weight loss, and has new complaint of ringing in both ears. She is accompanied by her daughter who is her [facility] POA.  She has Hx of MI & carotid stenosis for which she is seeing Dr Johnsie Cancel. I reviewed his note dated 09/01/14: he is treating hyperlipidemia & chronic angina. No changes to meds: she will continue to use nitro patch & has SL nitro tabs. Carotid duplex is planned for 6/'16 to eval mild carotid stenosis. She has hx of polymyositis & gout for which she is seeing Dr Trudie Reed & takes MTX & allopurinol. I reviewed her note dated 1//'16: labs reviewed-kidney & liver func nml, WBC nml, uric acid up slightly with lower dose of allopurinol-I reduced it due to elevated creat. She has had no flares of either PM or gout. Skin lesion: she did not see derm as lesion of concern on R arm is smaller. She has several other similar lesions-arms & legs. She does not wish to see derm. Prediabetes: She had several elevated glucose results-it was not clear if these were fasting. They were followed by A1C that was 6.2. She has made diet changes, lost about 5 pounds. No s&s of hyperglycemia. Wt loss: stable. Lost 1 lb in 8 weeks. BMI 20. Not drinking ensure. Says she is "eating well". Tinnitis: bilat. Started since last OV. Denies nasal congestion, cough, sore throat. She has been taking 325 mg ASA qd since d/c plavix due to elevated creat-about 4 mos. Etio is possibly salicylate toxicity.     BP is low today: she took dose lasix yesterday due to swelling of l leg for several days. Dr Johnsie Cancel made note of bilat LE edema at [facility] last week. She denies feeling lightheaded/dizzy with position changes. Consider stopping amlodipine.  The following portions of the patient's history were reviewed and updated as appropriate: allergies, current medications, past medical history, past social history, past surgical history and problem  list.  Review of Systems Constitutional: negative for fatigue Cardiovascular: positive for lower extremity edema and Left leg only. She took 1 dose lasix yesterday.    Objective:    BP 102/62 mmHg  Pulse 73  Temp(Src) 97.7 F (36.5 C) (Oral)  Ht 5\' 7"  (1.702 m)  Wt 131 lb (59.421 kg)  BMI 20.51 kg/m2  SpO2 95% BP 102/62 mmHg  Pulse 73  Temp(Src) 97.7 F (36.5 C) (Oral)  Ht 5\' 7"  (1.702 m)  Wt 131 lb (59.421 kg)  BMI 20.51 kg/m2  SpO2 95% General appearance: alert, cooperative, appears stated age and no distress Head: Normocephalic, without obvious abnormality, atraumatic Eyes: negative findings: lids and lashes normal and conjunctivae and sclerae normal Ears: normal TM and external ear canal right ear and nml L canal. L TM appears to have hole at top. Both TM slightly retracted. No fluid, bones visible bilat. Throat: lips, mucosa, and tongue normal; teeth and gums normal Lungs: clear to auscultation bilaterally Heart: regular rate and rhythm and pansystolic murmur  Extremities: extremities normal, atraumatic, no cyanosis or edema Pulses: 2+ and symmetric Skin: several seborrheic keratoses arms & legs. R upper arm: slightly raised, pink base, 1 cm. less erythematous than last exam. Nodular keratosis back of L hand.    Assessment:Plan     1. History of thromboembolism D/c plavix due to elevated creat - aspirin EC 325 MG tablet; Take 1 tablet (325 mg total) by mouth every other  day.  Dispense: 45 tablet; Refill: 1 2. Prediabetes - Hemoglobin A1c 3. Tinnitus of both ears Salicylate toxicity? F/u 3 weeks-if no improvement-ENT ref 4. Loss of weight, stable BMI 20 5. Skin lesion Monitor 6. Low blood pressure Likely due to meds Discussed increase risk for falls Check 3 pressures at home Stop amlodipine if under 130/85.  F/u 3 weeks-tinitis, BP. Needs Tdap, zoster & pneumococcal vaccines Spent 25 Minutes with patient, 50% or more was spent in counseling & coordination  of care.

## 2014-09-11 ENCOUNTER — Telehealth: Payer: Self-pay | Admitting: Nurse Practitioner

## 2014-09-11 NOTE — Telephone Encounter (Signed)
pls call pt: Advise Diabetes test confirms she does not have diabetes although she is at risk for developing diabetes. Her number has improved since it was last checked, so continue with reduced sugar in diet. Fresh fruit is OK. I will see her in 3 weeks.

## 2014-09-11 NOTE — Telephone Encounter (Signed)
Patients daughter Teresa notified of results 

## 2014-09-15 ENCOUNTER — Ambulatory Visit: Payer: Medicare Other | Admitting: Nurse Practitioner

## 2014-09-20 ENCOUNTER — Other Ambulatory Visit: Payer: Self-pay | Admitting: Cardiovascular Disease

## 2014-09-26 ENCOUNTER — Telehealth: Payer: Self-pay | Admitting: Cardiovascular Disease

## 2014-10-01 ENCOUNTER — Ambulatory Visit (INDEPENDENT_AMBULATORY_CARE_PROVIDER_SITE_OTHER): Payer: Medicare Other | Admitting: Nurse Practitioner

## 2014-10-01 ENCOUNTER — Encounter: Payer: Self-pay | Admitting: Nurse Practitioner

## 2014-10-01 VITALS — BP 151/84 | HR 63 | Temp 98.0°F | Ht 67.0 in | Wt 133.0 lb

## 2014-10-01 DIAGNOSIS — Z23 Encounter for immunization: Secondary | ICD-10-CM | POA: Diagnosis not present

## 2014-10-01 DIAGNOSIS — H9313 Tinnitus, bilateral: Secondary | ICD-10-CM

## 2014-10-01 NOTE — Progress Notes (Signed)
Pre visit review using our clinic review tool, if applicable. No additional management support is needed unless otherwise documented below in the visit note. 

## 2014-10-01 NOTE — Patient Instructions (Signed)
No changes to medications today.  I will consult w/Dr Johnsie Cancel regarding aspirin dose and let you know.  You do not have diabetes, but continue to limit refined sugar to decrease risk of developing diabetes.  Your blood pressure goal is to keep it under 150/90.  I would like to see you again in June to check kidney function & update tetanus vaccine.  Nice to see you!

## 2014-10-05 NOTE — Assessment & Plan Note (Addendum)
Improvement in symptoms w/decreased dose ASA Continue current dose asa Consider audiology referral if persistent.

## 2014-10-05 NOTE — Progress Notes (Signed)
Subjective:    Patient is a 81 y.o. female presents for f/u of tinnitus. She is accompanied by daughter today. Onset of symptoms was over 1 mo ago. She started 325 mg asa daily when plavix was d/c'd due to worsened kidney function. Ringing is in both ears, not associated with hearing loss. I decreased daily ASA to qod. Pt says ringing has improved, but is persistent.  Kidney func has improved since d/c plavix.  She has polymyositis. Sees rheum & is taking MTX. She is exercising legs-states she is able to rise from chair without using arms now.  The following portions of the patient's history were reviewed and updated as appropriate: allergies, current medications, past medical history, past social history, past surgical history and problem list.  Review of Systems Pertinent items are noted in HPI.    Objective:    BP 151/84 mmHg  Pulse 63  Temp(Src) 98 F (36.7 C) (Temporal)  Ht 5\' 7"  (1.702 m)  Wt 133 lb (60.328 kg)  BMI 20.83 kg/m2  SpO2 98% General:   alert, appears stated age, not in distress  Head and Face:   atraumatic, symmetrical  External Ears:   normal pinnae shape and position  Ext. Aud. Canal:  Right:patent   Left: patent   Tympanic Mem:  Heart: Lungs:  Right: normal landmarks and mobility  Left: normal landmarks and mobility  Systolic murmur. RRR Clear BS bilat           Assessment:Plan   1. Need for vaccination with 13-polyvalent pneumococcal conjugate vaccine - Pneumococcal conjugate vaccine 13-valent IM  2. Tinnitus of both ears Likely due to asa toxicity. Improved on lower dose, but still havi ng ringing. Hx "shower emboli" after cardiac cath. Intol to plavix. Continue 325 mg asa qod. If continues to complain, decrease further to 162 mg qd. Consider hearing eval.  F/u 3 mos htn, cmet

## 2014-10-08 ENCOUNTER — Other Ambulatory Visit: Payer: Self-pay | Admitting: Cardiovascular Disease

## 2014-10-11 ENCOUNTER — Other Ambulatory Visit: Payer: Self-pay | Admitting: Cardiovascular Disease

## 2014-11-03 ENCOUNTER — Telehealth: Payer: Self-pay

## 2014-11-03 ENCOUNTER — Other Ambulatory Visit: Payer: Self-pay

## 2014-11-03 DIAGNOSIS — M1009 Idiopathic gout, multiple sites: Secondary | ICD-10-CM | POA: Diagnosis not present

## 2014-11-03 DIAGNOSIS — K219 Gastro-esophageal reflux disease without esophagitis: Secondary | ICD-10-CM

## 2014-11-03 DIAGNOSIS — M3322 Polymyositis with myopathy: Secondary | ICD-10-CM | POA: Diagnosis not present

## 2014-11-03 DIAGNOSIS — Z79899 Other long term (current) drug therapy: Secondary | ICD-10-CM | POA: Diagnosis not present

## 2014-11-03 MED ORDER — OMEPRAZOLE 20 MG PO CPDR: 20.0000 mg | DELAYED_RELEASE_CAPSULE | Freq: Every day | ORAL | Status: DC | PRN

## 2014-11-03 NOTE — Telephone Encounter (Signed)
Patients daughter called requesting the dosage/how how often information for the Aspirin 325 EC.   I called patient back and LMOVM which was okay per message left by daughter. I informed her that her mom was to take 1 Aspirin 325 EC every other day. Told patients daughter to call back with any questions or concerns

## 2014-11-03 NOTE — Telephone Encounter (Signed)
Please Advise Refill Request? Refill request for- omeprazole caps 20 mg Last filled by MD on - 05/28/14 Last Appt - 10/01/14        Next Appt - 01/21/15 [facility]

## 2014-11-04 LAB — COMPREHENSIVE METABOLIC PANEL
A/G RATIO: 1.4
ALT: 42
AST: 27 U/L
Albumin: 4.2
Alkaline Phosphatase: 97
BUN/Creatinine Ratio: 30.9
BUN: 34
CALCIUM: 9.7 mg/dL
CO2: 29 mmol/L
CREATININE: 1.1
Chloride: 109 mmol/L
EGFR (Non-African Amer.): 50.36
GFR CALC AF AMER: 61.03
GLUCOSE: 117
Globulin: 2.9
Potassium: 4.6 mmol/L
Sodium: 140
Total Bilirubin: 0.6 mg/dL
Total Protein: 7.1 g/dL

## 2014-11-04 LAB — CBC WITH DIFFERENTIAL
Basophil %: 0.8
EOS%: 7 %
HCT: 35 %
Hemoglobin Other: 11.9
LYMPH%: 26 %
MCHC: 34.6
MCV: 102
MONO%: 9 %
MPV: 9.9 fL (ref 7.5–11.5)
NEUTROS PCT: 56.5
Platelet: 211
RBC: 3.44
RDW: 13.2
WBC: 6.3

## 2014-11-04 LAB — CK: Total CK: 155 U/L (ref ?–170.0)

## 2014-11-15 ENCOUNTER — Inpatient Hospital Stay (HOSPITAL_COMMUNITY)
Admission: EM | Admit: 2014-11-15 | Discharge: 2014-11-19 | DRG: 246 | Disposition: A | Discharge status: Home Health | Payer: Medicare Other | Attending: Internal Medicine | Admitting: Internal Medicine

## 2014-11-15 ENCOUNTER — Encounter (HOSPITAL_COMMUNITY): Payer: Self-pay | Admitting: Nurse Practitioner

## 2014-11-15 ENCOUNTER — Emergency Department (HOSPITAL_COMMUNITY): Payer: Medicare Other

## 2014-11-15 DIAGNOSIS — T82857A Stenosis of cardiac prosthetic devices, implants and grafts, initial encounter: Secondary | ICD-10-CM | POA: Diagnosis not present

## 2014-11-15 DIAGNOSIS — Z7982 Long term (current) use of aspirin: Secondary | ICD-10-CM | POA: Diagnosis not present

## 2014-11-15 DIAGNOSIS — Z9010 Acquired absence of unspecified breast and nipple: Secondary | ICD-10-CM | POA: Diagnosis present

## 2014-11-15 DIAGNOSIS — I2584 Coronary atherosclerosis due to calcified coronary lesion: Secondary | ICD-10-CM | POA: Diagnosis present

## 2014-11-15 DIAGNOSIS — Z888 Allergy status to other drugs, medicaments and biological substances status: Secondary | ICD-10-CM

## 2014-11-15 DIAGNOSIS — M109 Gout, unspecified: Secondary | ICD-10-CM | POA: Diagnosis present

## 2014-11-15 DIAGNOSIS — M3322 Polymyositis with myopathy: Secondary | ICD-10-CM | POA: Diagnosis present

## 2014-11-15 DIAGNOSIS — Z7902 Long term (current) use of antithrombotics/antiplatelets: Secondary | ICD-10-CM | POA: Diagnosis not present

## 2014-11-15 DIAGNOSIS — I252 Old myocardial infarction: Secondary | ICD-10-CM | POA: Diagnosis not present

## 2014-11-15 DIAGNOSIS — Z955 Presence of coronary angioplasty implant and graft: Secondary | ICD-10-CM

## 2014-11-15 DIAGNOSIS — I25110 Atherosclerotic heart disease of native coronary artery with unstable angina pectoris: Secondary | ICD-10-CM | POA: Diagnosis not present

## 2014-11-15 DIAGNOSIS — Z79899 Other long term (current) drug therapy: Secondary | ICD-10-CM | POA: Diagnosis not present

## 2014-11-15 DIAGNOSIS — Z853 Personal history of malignant neoplasm of breast: Secondary | ICD-10-CM | POA: Diagnosis not present

## 2014-11-15 DIAGNOSIS — D649 Anemia, unspecified: Secondary | ICD-10-CM | POA: Diagnosis present

## 2014-11-15 DIAGNOSIS — I10 Essential (primary) hypertension: Secondary | ICD-10-CM | POA: Diagnosis present

## 2014-11-15 DIAGNOSIS — F0390 Unspecified dementia without behavioral disturbance: Secondary | ICD-10-CM | POA: Diagnosis present

## 2014-11-15 DIAGNOSIS — I350 Nonrheumatic aortic (valve) stenosis: Secondary | ICD-10-CM | POA: Diagnosis not present

## 2014-11-15 DIAGNOSIS — I6523 Occlusion and stenosis of bilateral carotid arteries: Secondary | ICD-10-CM | POA: Diagnosis present

## 2014-11-15 DIAGNOSIS — Z880 Allergy status to penicillin: Secondary | ICD-10-CM | POA: Diagnosis not present

## 2014-11-15 DIAGNOSIS — I214 Non-ST elevation (NSTEMI) myocardial infarction: Secondary | ICD-10-CM | POA: Diagnosis not present

## 2014-11-15 DIAGNOSIS — Z885 Allergy status to narcotic agent status: Secondary | ICD-10-CM

## 2014-11-15 DIAGNOSIS — R079 Chest pain, unspecified: Secondary | ICD-10-CM | POA: Diagnosis not present

## 2014-11-15 DIAGNOSIS — Z86718 Personal history of other venous thrombosis and embolism: Secondary | ICD-10-CM

## 2014-11-15 DIAGNOSIS — E785 Hyperlipidemia, unspecified: Secondary | ICD-10-CM | POA: Diagnosis present

## 2014-11-15 DIAGNOSIS — I2510 Atherosclerotic heart disease of native coronary artery without angina pectoris: Secondary | ICD-10-CM | POA: Diagnosis present

## 2014-11-15 DIAGNOSIS — I200 Unstable angina: Secondary | ICD-10-CM | POA: Diagnosis not present

## 2014-11-15 DIAGNOSIS — R0789 Other chest pain: Secondary | ICD-10-CM | POA: Diagnosis not present

## 2014-11-15 LAB — BASIC METABOLIC PANEL
Anion gap: 10 (ref 5–15)
BUN: 30 mg/dL — AB (ref 6–23)
CHLORIDE: 106 mmol/L (ref 96–112)
CO2: 22 mmol/L (ref 19–32)
Calcium: 8.9 mg/dL (ref 8.4–10.5)
Creatinine, Ser: 1.02 mg/dL (ref 0.50–1.10)
GFR calc Af Amer: 57 mL/min — ABNORMAL LOW (ref >90)
GFR, EST NON AFRICAN AMERICAN: 49 mL/min — AB (ref >90)
Glucose, Bld: 234 mg/dL — ABNORMAL HIGH (ref 70–99)
Potassium: 4 mmol/L (ref 3.5–5.1)
SODIUM: 138 mmol/L (ref 135–145)

## 2014-11-15 LAB — CBC
HCT: 35.7 % — ABNORMAL LOW (ref 36.0–46.0)
HEMOGLOBIN: 12.3 g/dL (ref 12.0–15.0)
MCH: 33.8 pg (ref 26.0–34.0)
MCHC: 34.5 g/dL (ref 30.0–36.0)
MCV: 98.1 fL (ref 78.0–100.0)
Platelets: 242 10*3/uL (ref 150–400)
RBC: 3.64 MIL/uL — ABNORMAL LOW (ref 3.87–5.11)
RDW: 14.1 % (ref 11.5–15.5)
WBC: 6.3 10*3/uL (ref 4.0–10.5)

## 2014-11-15 LAB — TROPONIN I: TROPONIN I: 0.03 ng/mL (ref <0.031)

## 2014-11-15 LAB — I-STAT TROPONIN, ED: Troponin i, poc: 0.03 ng/mL (ref 0.00–0.08)

## 2014-11-15 LAB — MRSA PCR SCREENING: MRSA by PCR: NEGATIVE

## 2014-11-15 LAB — TSH: TSH: 3.665 u[IU]/mL (ref 0.350–4.500)

## 2014-11-15 MED ORDER — MORPHINE SULFATE 4 MG/ML IJ SOLN
4.0000 mg | Freq: Once | INTRAMUSCULAR | Status: AC
  Administered 2014-11-15: 4 mg via INTRAVENOUS
  Filled 2014-11-15: qty 1

## 2014-11-15 MED ORDER — PRAVASTATIN SODIUM 40 MG PO TABS
40.0000 mg | ORAL_TABLET | Freq: Every day | ORAL | Status: DC
  Administered 2014-11-15 – 2014-11-19 (×5): 40 mg via ORAL
  Filled 2014-11-15 (×5): qty 1

## 2014-11-15 MED ORDER — ONDANSETRON HCL 4 MG/2ML IJ SOLN: 4.0000 mg | Freq: Four times a day (QID) | INTRAMUSCULAR | Status: DC | PRN

## 2014-11-15 MED ORDER — HEPARIN BOLUS VIA INFUSION
4000.0000 [IU] | Freq: Once | INTRAVENOUS | Status: AC
  Administered 2014-11-15: 4000 [IU] via INTRAVENOUS
  Filled 2014-11-15: qty 4000

## 2014-11-15 MED ORDER — AMLODIPINE BESYLATE 2.5 MG PO TABS
2.5000 mg | ORAL_TABLET | Freq: Every day | ORAL | Status: DC
  Administered 2014-11-15 – 2014-11-19 (×5): 2.5 mg via ORAL
  Filled 2014-11-15 (×5): qty 1

## 2014-11-15 MED ORDER — METOPROLOL TARTRATE 1 MG/ML IV SOLN
5.0000 mg | Freq: Once | INTRAVENOUS | Status: AC
  Administered 2014-11-15: 5 mg via INTRAVENOUS
  Filled 2014-11-15: qty 5

## 2014-11-15 MED ORDER — NITROGLYCERIN 2 % TD OINT
1.0000 [in_us] | TOPICAL_OINTMENT | Freq: Three times a day (TID) | TRANSDERMAL | Status: DC
  Administered 2014-11-15: 1 [in_us] via TOPICAL
  Filled 2014-11-15: qty 1

## 2014-11-15 MED ORDER — ONDANSETRON HCL 4 MG/2ML IJ SOLN
4.0000 mg | Freq: Once | INTRAMUSCULAR | Status: AC
  Administered 2014-11-15: 4 mg via INTRAVENOUS
  Filled 2014-11-15: qty 2

## 2014-11-15 MED ORDER — NITROGLYCERIN 0.4 MG SL SUBL: 0.4000 mg | SUBLINGUAL_TABLET | SUBLINGUAL | Status: DC | PRN

## 2014-11-15 MED ORDER — ASPIRIN 81 MG PO CHEW
81.0000 mg | CHEWABLE_TABLET | Freq: Every day | ORAL | Status: DC
  Administered 2014-11-16 – 2014-11-19 (×3): 81 mg via ORAL
  Filled 2014-11-15 (×4): qty 1

## 2014-11-15 MED ORDER — CARVEDILOL 12.5 MG PO TABS
12.5000 mg | ORAL_TABLET | Freq: Two times a day (BID) | ORAL | Status: DC
  Administered 2014-11-15 – 2014-11-19 (×8): 12.5 mg via ORAL
  Filled 2014-11-15 (×10): qty 1

## 2014-11-15 MED ORDER — METOPROLOL TARTRATE 25 MG PO TABS
25.0000 mg | ORAL_TABLET | Freq: Three times a day (TID) | ORAL | Status: DC
  Administered 2014-11-15: 25 mg via ORAL
  Filled 2014-11-15 (×4): qty 1

## 2014-11-15 MED ORDER — NITROGLYCERIN IN D5W 200-5 MCG/ML-% IV SOLN
0.0000 ug/min | INTRAVENOUS | Status: DC
  Administered 2014-11-15: 5 ug/min via INTRAVENOUS
  Administered 2014-11-16: 35 ug/min via INTRAVENOUS
  Filled 2014-11-15 (×2): qty 250

## 2014-11-15 MED ORDER — ASPIRIN EC 81 MG PO TBEC: 81.0000 mg | DELAYED_RELEASE_TABLET | Freq: Every day | ORAL | Status: DC

## 2014-11-15 MED ORDER — HEPARIN (PORCINE) IN NACL 100-0.45 UNIT/ML-% IJ SOLN
750.0000 [IU]/h | INTRAMUSCULAR | Status: DC
  Administered 2014-11-15: 700 [IU]/h via INTRAVENOUS
  Administered 2014-11-16: 750 [IU]/h via INTRAVENOUS
  Filled 2014-11-15 (×3): qty 250

## 2014-11-15 MED ORDER — PRAVASTATIN SODIUM 40 MG PO TABS: 40.0000 mg | ORAL_TABLET | Freq: Every day | ORAL | Status: DC

## 2014-11-15 MED ORDER — ACETAMINOPHEN 325 MG PO TABS
650.0000 mg | ORAL_TABLET | ORAL | Status: DC | PRN
  Administered 2014-11-17: 650 mg via ORAL
  Filled 2014-11-15: qty 2

## 2014-11-15 NOTE — H&P (Addendum)
Cardiologist: Sara Lynch is an 81 y.o. female.   Chief Complaint: Chest pain HPI:   81 y.o. referred from [place] affiliated with [facility]. Her daughter Sara Lynch is her health care power of attorney. The patient has advancing dementia with LE weakness from polymyositis and moved to [facility] to live with her oldest daughter. She has CAD that is ill defined Cath in 1610 complicated by embolic shower with stroke and she was cautioned never to have another. She has chronic chest pain. Admitted to [facility] April with MI.  This occurred 2 days after starting Ranexa.  Daughter thinks some of her "angina" is anxiety driven. Majority of episodes at night.   She has bilateral LE weakness from polymyositis and PT/rehab is limited by chest pain. It is obvious from talking to her and daughter that dementia and memory are getting worse ( that's why she moved to [place] to be with daughter) She does not want to be DNR and daughter knows she can withdraw care if situation hopeless.  01/21/14: Nuclear study normal with no ischemia EF 76%.  Doppler with bilateral 40-59% ICA stenosis   During last office visit in Feb 2016 she was still reporting anginal sounding pain with exertion. Improved with nitro patch at night. F/U echo for AS murmur1/11/16showed mild AS and normal EF.  She presents today with CP which started around 1000hrs and radiates to her arms, neck/jaw and back.  She took three SL NTG and each time it eased the pain but then returned.  She called EMS who gave her more NTG.  She reports a little dizziness but denies nausea, vomiting, fever, shortness of breath, orthopnea, PND, cough, congestion, abdominal pain, hematochezia, melena, lower extremity edema, claudication.  She takes lasix PRN for edema and took one yesterday.  No LEE today.  She has been using SL NTG on a more frequent basis.   She is not using the ntg patch any longer.  Cardiac catheterization performed 09/25/07.  Left main: 40% ostial, LAD 20% proximal, 30% mid, 50% distal. LCX: 40% ramus. RCA 99% ostial, 30% proximal, 20% mid. PCI with drug-eluting stent x4 to RCA. 2. Echocardiogram performed 09/25/07, ejection fraction greater than 55%. Mild MR, trivial PR, mild TR. No valvular stenosis.   Past Medical History  Diagnosis Date  . Heart murmur   . Hyperlipidemia   . UTI (urinary tract infection)   . Hypertension   . Myocardial infarction     times 2  . Breast cancer     81 yo: s/p radical mastectomy  . Polymyositis     Methotrexate x years (Dr. Lenna Gilford is rheum as of 2015)  . Gout     Past Surgical History  Procedure Laterality Date  . Appendectomy    . Abdominal hysterectomy    . Cataract extraction Bilateral   . Mastectomy, radical Left     Family History  Problem Relation Age of Onset  . Cancer Mother     breast   Social History:  reports that she has never smoked. She does not have any smokeless tobacco history on file. She reports that she does not drink alcohol or use illicit drugs.  Allergies:  Allergies  Allergen Reactions  . Alendronate     HEADACHES  . Codeine   . Enalapril Maleate Cough  . Fosamax [Alendronate Sodium]   . Oxycodone Nausea And Vomiting    Nausea, abdominal pain  . Penicillins      (Not in  a hospital admission)  Results for orders placed or performed during the hospital encounter of 11/15/14 (from the past 48 hour(s))  CBC     Status: Abnormal   Collection Time: 11/15/14  1:07 PM  Result Value Ref Range   WBC 6.3 4.0 - 10.5 K/uL   RBC 3.64 (L) 3.87 - 5.11 MIL/uL   Hemoglobin 12.3 12.0 - 15.0 g/dL   HCT 35.7 (L) 36.0 - 46.0 %   MCV 98.1 78.0 - 100.0 fL   MCH 33.8 26.0 - 34.0 pg   MCHC 34.5 30.0 - 36.0 g/dL   RDW 14.1 11.5 - 15.5 %   Platelets 242 150 - 400 K/uL  Basic metabolic panel     Status: Abnormal   Collection Time: 11/15/14  1:07 PM  Result Value Ref Range   Sodium 138 135 - 145 mmol/L   Potassium 4.0 3.5 - 5.1 mmol/L   Chloride  106 96 - 112 mmol/L   CO2 22 19 - 32 mmol/L   Glucose, Bld 234 (H) 70 - 99 mg/dL   BUN 30 (H) 6 - 23 mg/dL   Creatinine, Ser 1.02 0.50 - 1.10 mg/dL   Calcium 8.9 8.4 - 10.5 mg/dL   GFR calc non Af Amer 49 (L) >90 mL/min   GFR calc Af Amer 57 (L) >90 mL/min    Comment: (NOTE) The eGFR has been calculated using the CKD EPI equation. This calculation has not been validated in all clinical situations. eGFR's persistently <90 mL/min signify possible Chronic Kidney Disease.    Anion gap 10 5 - 15  Troponin I     Status: None   Collection Time: 11/15/14  1:07 PM  Result Value Ref Range   Troponin I 0.03 <0.031 ng/mL    Comment:        NO INDICATION OF MYOCARDIAL INJURY.   I-stat troponin, ED (not at [facility])     Status: None   Collection Time: 11/15/14  1:18 PM  Result Value Ref Range   Troponin i, poc 0.03 0.00 - 0.08 ng/mL   Comment 3            Comment: Due to the release kinetics of cTnI, a negative result within the first hours of the onset of symptoms does not rule out myocardial infarction with certainty. If myocardial infarction is still suspected, repeat the test at appropriate intervals.    Dg Chest Port 1 View  11/15/2014   CLINICAL DATA:  Left-sided chest pain began this morning. Radiates to the back  EXAM: PORTABLE CHEST - 1 VIEW  COMPARISON:  None.  FINDINGS: The heart size and mediastinal contours are within normal limits. Both lungs are clear. The visualized skeletal structures are unremarkable.  IMPRESSION: No active disease.   Electronically Signed   By: Kathreen Devoid   On: 11/15/2014 14:03    Review of Systems  Constitutional: Negative for fever and diaphoresis.  HENT: Negative for congestion.   Respiratory: Negative for cough and shortness of breath.   Cardiovascular: Positive for chest pain and leg swelling (Resolved). Negative for orthopnea.  Gastrointestinal: Negative for nausea, vomiting, abdominal pain, blood in stool and melena.  Genitourinary: Negative for  hematuria.  Musculoskeletal: Positive for neck pain.  Neurological: Positive for dizziness.  All other systems reviewed and are negative.   Blood pressure 135/89, pulse 77, temperature 97.8 F (36.6 C), temperature source Oral, resp. rate 20, height 5' 7"  (1.702 m), weight 133 lb (60.328 kg), SpO2 98 %. Physical Exam  Nursing note and vitals reviewed. Constitutional: She is oriented to person, place, and time. She appears well-developed and well-nourished. No distress.  HENT:  Head: Normocephalic and atraumatic.  Mouth/Throat: No oropharyngeal exudate.  Eyes: EOM are normal. Pupils are equal, round, and reactive to light. No scleral icterus.  Neck: Normal range of motion. Neck supple.  Cardiovascular: Normal rate, regular rhythm, S1 normal and S2 normal.   Murmur heard.  Systolic murmur is present with a grade of 1/6  Pulses:      Radial pulses are 2+ on the right side, and 2+ on the left side.       Dorsalis pedis pulses are 2+ on the right side, and 2+ on the left side.  Respiratory: Effort normal and breath sounds normal. She has no wheezes. She has no rales.  GI: Soft. Bowel sounds are normal. She exhibits no distension.  Musculoskeletal: She exhibits no edema.  Lymphadenopathy:    She has no cervical adenopathy.  Neurological: She is alert and oriented to person, place, and time. She exhibits normal muscle tone.  Skin: Skin is warm and dry.  Psychiatric: She has a normal mood and affect.     Assessment/Plan Principal Problem:   Unstable angina Active Problems:   Hyperlipidemia   History of thromboembolism after heart cath   CAD (coronary artery disease)  Currently pain free on IV NTG.  Admit to tele for observation.  EKG reveals ST depression in V5 and maybe V4(baseline is  Wandering).  Continue coreg, amlodipine and adjust NTG as BP allows.  Hold cozaar for now.  Continue statin.  LDL 87 last SeptTarri Fuller, [facility] 11/15/2014, 3:59 PM   Patient seen and  examined with Tarri Fuller, PA-C. We discussed all aspects of the encounter. I agree with the assessment and plan as stated above.   CP very concerning for progressive/unstable angina. Now pain free. ECG with lateral ST depression (no old tracing available). Will admit. Treat with IV heparin, ASA, NTG, statin and b-blocker. Plan cath Monday. Aware of risk of recurrent embolic phenomenon with cath.   Benay Spice 4:27 PM

## 2014-11-15 NOTE — ED Notes (Signed)
Phlebotomy at bedside.

## 2014-11-15 NOTE — Progress Notes (Signed)
ANTICOAGULATION CONSULT NOTE - Initial Consult  Pharmacy Consult for Heparin  Indication: chest pain/ACS  Allergies  Allergen Reactions  . Alendronate     HEADACHES  . Codeine   . Enalapril Maleate Cough  . Fosamax [Alendronate Sodium]   . Oxycodone Nausea And Vomiting    Nausea, abdominal pain  . Penicillins     Patient Measurements: Height: 5\' 7"  (170.2 cm) Weight: 133 lb (60.328 kg) IBW/kg (Calculated) : 61.6  Vital Signs: Temp: 97.8 F (36.6 C) (04/23 1251) Temp Source: Oral (04/23 1251) BP: 139/68 mmHg (04/23 1430) Pulse Rate: 73 (04/23 1430)  Labs:  Recent Labs  11/15/14 1307  HGB 12.3  HCT 35.7*  PLT 242  CREATININE 1.02  TROPONINI 0.03    Estimated Creatinine Clearance: 39.8 mL/min (by C-G formula based on Cr of 1.02).   Medical History: Past Medical History  Diagnosis Date  . Heart murmur   . Hyperlipidemia   . UTI (urinary tract infection)   . Hypertension   . Myocardial infarction     times 2  . Breast cancer     81 yo: s/p radical mastectomy  . Polymyositis     Methotrexate x years (Dr. Lenna Gilford is rheum as of 2015)  . Gout     Medications:   (Not in a hospital admission)  Assessment: 81 YOF with who presented with left-sided chest pressure that radiates into her back. Patient's EKG showed ST depression with T wave inversion. Troponin negative. pharmacy consulted to start IV heparin for ACS.   Goal of Therapy:  INR 2-3 Heparin level 0.3-0.7 units/ml Monitor platelets by anticoagulation protocol: Yes   Plan:  -Heparin 4000 units bolus followed by 700 units/hr  -F/u 8 hour HL -Monitor daily HL, CBC and s/s bleeding  Albertina Parr, PharmD., BCPS Clinical Pharmacist Pager [phone]

## 2014-11-15 NOTE — ED Provider Notes (Signed)
TIME SEEN: 1:10 PM  CHIEF COMPLAINT:  Chest pain  HPI: Pt is a 81 y.o.  Female with history of hypertension, hyperlipidemia, breast cancer status post mastectomy , CAD status post 2 stents who presents to the emergency department with left-sided chest pressure that radiates into her back that started this morning around 9:30 AM at rest. Patient took 3 nitroglycerin with some relief for proctoscopy 15 minutes then pain returned. EMS provided her another 3 nitroglycerin and aspirin with some relief but now the pain is back again. Radiates into her bilateral upper arms into her jaw. She feels like her breath is "labored". She is having lightheadedness. Also having nausea. No diaphoresis. Reports this feels that her prior anginal equivalent. States that she has had chest pain intermittently for the past year but it always resolves with 1-2 nitroglycerin tablets. This is abnormal for her pain not to resolve. Family reports her last cardiac  catheterization was 5 years ago. They state that she has had prior cardiac workup at [facility] as she recently lived in [place] and now lives in [place]. Cardiologist is Dr. Johnsie Cancel.  PCP is with [facility].    ROS: See HPI Constitutional: no fever  Eyes: no drainage  ENT: no runny nose   Cardiovascular:   chest pain  Resp:  SOB  GI: no vomiting GU: no dysuria Integumentary: no rash  Allergy: no hives  Musculoskeletal: no leg swelling  Neurological: no slurred speech ROS otherwise negative  PAST MEDICAL HISTORY/PAST SURGICAL HISTORY:  Past Medical History  Diagnosis Date  . Heart murmur   . Hyperlipidemia   . UTI (urinary tract infection)   . Hypertension   . Myocardial infarction     times 2  . Breast cancer     81 yo: s/p radical mastectomy  . Polymyositis     Methotrexate x years (Dr. Lenna Gilford is rheum as of 2015)  . Gout     MEDICATIONS:  Prior to Admission medications   Medication Sig Start Date End Date Taking? Authorizing Provider  acetaminophen  (TYLENOL) 500 MG tablet Take 500 mg by mouth.    Historical Provider, MD  allopurinol (ZYLOPRIM) 100 MG tablet Take 100 mg by mouth daily.    Historical Provider, MD  amLODipine (NORVASC) 2.5 MG tablet TAKE 1 TABLET DAILY 10/13/14   Josue Hector, MD  aspirin EC 325 MG tablet Take 1 tablet (325 mg total) by mouth every other day. 09/10/14   Irene Pap, NP  carvedilol (COREG) 12.5 MG tablet TAKE 1 TABLET TWICE A DAY WITH MEALS 10/09/14   Josue Hector, MD  Cholecalciferol (VITAMIN D-3 PO) Take by mouth.    Historical Provider, MD  folic acid (FOLVITE) 1 MG tablet Take 1 mg by mouth daily.    Historical Provider, MD  furosemide (LASIX) 40 MG tablet TAKE 1 TABLET AS NEEDED Patient not taking: Reported on 10/01/2014 09/22/14   Josue Hector, MD  losartan (COZAAR) 25 MG tablet TAKE 1 TABLET DAILY 10/09/14   Josue Hector, MD  methotrexate 2.5 MG tablet Take by mouth. On wednesdays Take three pills a week    Historical Provider, MD  nitroGLYCERIN (NITROSTAT) 0.4 MG SL tablet Place 1 tablet (0.4 mg total) under the tongue every 5 (five) minutes as needed for chest pain. Patient not taking: Reported on 10/01/2014 09/01/14   Josue Hector, MD  omeprazole (PRILOSEC) 20 MG capsule Take 1 capsule (20 mg total) by mouth daily as needed. 11/03/14   Layne  Read Drivers, NP  pravastatin (PRAVACHOL) 40 MG tablet Take 1 tablet (40 mg total) by mouth daily. 09/01/14   Josue Hector, MD    ALLERGIES:  Allergies  Allergen Reactions  . Alendronate     HEADACHES  . Codeine   . Enalapril Maleate Cough  . Fosamax [Alendronate Sodium]   . Oxycodone Nausea And Vomiting    Nausea, abdominal pain  . Penicillins     SOCIAL HISTORY:  History  Substance Use Topics  . Smoking status: Never Smoker   . Smokeless tobacco: Not on file  . Alcohol Use: No    FAMILY HISTORY: Family History  Problem Relation Age of Onset  . Cancer Mother     breast    EXAM: BP 164/87 mmHg  Pulse 85  Temp(Src) 97.8 F (36.6 C) (Oral)   Resp 16  Ht 5\' 7"  (1.702 m)  Wt 133 lb (60.328 kg)  BMI 20.83 kg/m2  SpO2 100% CONSTITUTIONAL: Alert and oriented and responds appropriately to questions.  Elderly, appears uncomfortable HEAD: Normocephalic EYES: Conjunctivae clear, PERRL ENT: normal nose; no rhinorrhea; moist mucous membranes; pharynx without lesions noted NECK: Supple, no meningismus, no LAD  CARD: RRR; S1 and S2 appreciated; no murmurs, no clicks, no rubs, no gallops RESP: Normal chest excursion without splinting or tachypnea; breath sounds clear and equal bilaterally; no wheezes, no rhonchi, no rales,  No hypoxia or respiratory distress ABD/GI: Normal bowel sounds; non-distended; soft, non-tender, no rebound, no guarding BACK:  The back appears normal and is non-tender to palpation, there is no CVA tenderness EXT: Normal ROM in all joints; non-tender to palpation; no edema; normal capillary refill; no cyanosis    SKIN: Normal color for age and race; warm NEURO: Moves all extremities equally PSYCH: The patient's mood and manner are appropriate. Grooming and personal hygiene are appropriate.  MEDICAL DECISION MAKING:  Patient here with chest pain. EKG shows ST depression T wave inversions in lateral leads with no old for comparison. She has a significant cardiac history. Given aspirin by EMS. We'll give morphine for pain. Will obtain cardiac labs, chest x-ray. Anticipate admission. We'll attempt to obtain old records from [facility].  ED PROGRESS:  Patient's labs are unremarkable. Troponin negative. Chest x-ray clear. Patient continues to have recurrent pain. We'll discuss with cardiology.  2:38 PM  D/w Dr. Mare Ferrari  With cardiology who agrees with starting the patient on a nitroglycerin and heparin drip. They will see the patient in consult. He recommends medicine admission. We'll discuss with hospitalist.  3:09 PM  D/w Agustina Caroli with  hospitalist service who agrees on admission to step down bed. I will place  holding orders.   EKG Interpretation  Date/Time:  Saturday November 15 2014 12:51:04 EDT Ventricular Rate:  81 PR Interval:  201 QRS Duration: 91 QT Interval:  411 QTC Calculation: 477 R Axis:   81 Text Interpretation:  Sinus rhythm Consider left atrial enlargement Borderline right axis deviation LVH with secondary repolarization abnormality Anterior Q waves, possibly due to LVH ST depression and T wave inversion in lateral leads No old tracing to compare Reconfirmed by Diondra Pines,  DO, Eira Alpert ([id number]) on 11/15/2014 12:55:46 PM         CRITICAL CARE Performed by: Nyra Jabs   Total critical care time: 45 minutes  Critical care time was exclusive of separately billable procedures and treating other patients.  Critical care was necessary to treat or prevent imminent or life-threatening deterioration.  Critical care was time spent  personally by me on the following activities: development of treatment plan with patient and/or surrogate as well as nursing, discussions with consultants, evaluation of patient's response to treatment, examination of patient, obtaining history from patient or surrogate, ordering and performing treatments and interventions, ordering and review of laboratory studies, ordering and review of radiographic studies, pulse oximetry and re-evaluation of patient's condition.   [place], DO 11/15/14 [phone]

## 2014-11-15 NOTE — ED Notes (Signed)
Portable at bedside 

## 2014-11-15 NOTE — Progress Notes (Signed)
Dr. Haroldine Laws at bedside.  Pt complaining of chest pain after exertion.  Orders received.  Will continue to monitor pt closely.

## 2014-11-15 NOTE — ED Notes (Signed)
Per EMS patient from home c/o central- left chest pain that was sharp and radiated to back. Patient took 3 nitroglycerin SL with relief for short time. EMS noted that nitroglycerin patient took was expired so they administered another 3 nitroglycerin with moderate relief. With last administered nitroglycerin patient endorses numbness and tingling in bilateral upper arms and radiating up both sides of the jaw. Patient now rates pain 5/10. Patient has had 324 ASA. Patient endorses mild nausea. Denies shortness of breath. Dizziness, or lightheadedness.   EKG- NSR with peaked T- wave in V3

## 2014-11-15 NOTE — ED Notes (Signed)
NTG paste moved to left chest area

## 2014-11-15 NOTE — ED Notes (Signed)
Dr. Ward at bedside.

## 2014-11-16 DIAGNOSIS — I350 Nonrheumatic aortic (valve) stenosis: Secondary | ICD-10-CM

## 2014-11-16 DIAGNOSIS — I2510 Atherosclerotic heart disease of native coronary artery without angina pectoris: Secondary | ICD-10-CM

## 2014-11-16 LAB — CBC
HCT: 30 % — ABNORMAL LOW (ref 36.0–46.0)
Hemoglobin: 10.4 g/dL — ABNORMAL LOW (ref 12.0–15.0)
MCH: 34.3 pg — AB (ref 26.0–34.0)
MCHC: 34.7 g/dL (ref 30.0–36.0)
MCV: 99 fL (ref 78.0–100.0)
Platelets: 214 10*3/uL (ref 150–400)
RBC: 3.03 MIL/uL — AB (ref 3.87–5.11)
RDW: 14.2 % (ref 11.5–15.5)
WBC: 6.1 10*3/uL (ref 4.0–10.5)

## 2014-11-16 LAB — HEPARIN LEVEL (UNFRACTIONATED)
Heparin Unfractionated: 0.17 IU/mL — ABNORMAL LOW (ref 0.30–0.70)
Heparin Unfractionated: 0.58 IU/mL (ref 0.30–0.70)
Heparin Unfractionated: 0.74 IU/mL — ABNORMAL HIGH (ref 0.30–0.70)

## 2014-11-16 LAB — LIPID PANEL
CHOL/HDL RATIO: 2.7 ratio
Cholesterol: 132 mg/dL (ref 0–200)
HDL: 49 mg/dL (ref >39)
LDL Cholesterol: 72 mg/dL (ref 0–99)
Triglycerides: 56 mg/dL (ref <150)
VLDL: 11 mg/dL (ref 0–40)

## 2014-11-16 LAB — BASIC METABOLIC PANEL
ANION GAP: 8 (ref 5–15)
Anion gap: 10 (ref 5–15)
BUN: 22 mg/dL (ref 6–23)
BUN: 23 mg/dL (ref 6–23)
CALCIUM: 9 mg/dL (ref 8.4–10.5)
CO2: 25 mmol/L (ref 19–32)
CO2: 25 mmol/L (ref 19–32)
Calcium: 8.6 mg/dL (ref 8.4–10.5)
Chloride: 102 mmol/L (ref 96–112)
Chloride: 107 mmol/L (ref 96–112)
Creatinine, Ser: 1.03 mg/dL (ref 0.50–1.10)
Creatinine, Ser: 1.11 mg/dL — ABNORMAL HIGH (ref 0.50–1.10)
GFR calc Af Amer: 57 mL/min — ABNORMAL LOW (ref >90)
GFR calc non Af Amer: 45 mL/min — ABNORMAL LOW (ref >90)
GFR, EST AFRICAN AMERICAN: 52 mL/min — AB (ref >90)
GFR, EST NON AFRICAN AMERICAN: 49 mL/min — AB (ref >90)
Glucose, Bld: 125 mg/dL — ABNORMAL HIGH (ref 70–99)
Glucose, Bld: 132 mg/dL — ABNORMAL HIGH (ref 70–99)
Potassium: 3.9 mmol/L (ref 3.5–5.1)
Potassium: 3.7 mmol/L (ref 3.5–5.1)
SODIUM: 140 mmol/L (ref 135–145)
SODIUM: 137 mmol/L (ref 135–145)

## 2014-11-16 LAB — PROTIME-INR
INR: 1.14 (ref 0.00–1.49)
Prothrombin Time: 14.8 seconds (ref 11.6–15.2)

## 2014-11-16 MED ORDER — SODIUM CHLORIDE 0.9 % IJ SOLN: 3.0000 mL | INTRAMUSCULAR | Status: DC | PRN

## 2014-11-16 MED ORDER — SODIUM CHLORIDE 0.9 % IJ SOLN
3.0000 mL | Freq: Two times a day (BID) | INTRAMUSCULAR | Status: DC
  Administered 2014-11-16: 3 mL via INTRAVENOUS

## 2014-11-16 NOTE — Progress Notes (Signed)
Subjective:   On IV NTG. Denies further CP this am. Had one episode last night. Numerous questions about the procedure tomorrow.   Cardiac catheterization performed 09/25/07. Left main: 40% ostial, LAD 20% proximal, 30% mid, 50% distal. LCX: 40% ramus. RCA 99% ostial, 30% proximal, 20% mid. PCI with drug-eluting stent x4 to RCA. 2. Echocardiogram performed 09/25/07, ejection fraction greater than 55%. Mild MR, trivial PR, mild TR. No valvular stenosis.   Intake/Output Summary (Last 24 hours) at 11/16/14 1100 Last data filed at 11/16/14 1000  Gross per 24 hour  Intake 616.53 ml  Output    400 ml  Net 216.53 ml    Current meds: . amLODipine  2.5 mg Oral Daily  . aspirin  81 mg Oral Daily  . carvedilol  12.5 mg Oral BID AC  . metoprolol tartrate  25 mg Oral TID  . pravastatin  40 mg Oral Daily   Infusions: . heparin 650 Units/hr (11/16/14 0959)  . nitroGLYCERIN 25 mcg/min (11/16/14 0700)     Objective:  Blood pressure 111/87, pulse 82, temperature 98.8 F (37.1 C), temperature source Oral, resp. rate 19, height 5\' 7"  (1.702 m), weight 58.7 kg (129 lb 6.6 oz), SpO2 96 %. Weight change:   Physical Exam: General:  Elderly  No resp difficulty HEENT: normal Neck: supple. JVP flat . Carotids 2+ bilat; bilateral carotid bruits R> L No lymphadenopathy or thryomegaly appreciated. Cor: PMI nondisplaced. Regular rate & rhythm. 2/6 AS with S2 moderately reduced Lungs: clear Abdomen: soft, nontender, nondistended. No hepatosplenomegaly. No bruits or masses. Good bowel sounds. Extremities: no cyanosis, clubbing, rash, edema Neuro: alert & orientedx3, cranial nerves grossly intact. moves all 4 extremities w/o difficulty. Affect pleasant  Telemetry: SR  Lab Results: Basic Metabolic Panel:  Recent Labs Lab 11/15/14 1307 11/16/14 0017  NA 138 137  K 4.0 3.9  CL 106 102  CO2 22 25  GLUCOSE 234* 125*  BUN 30* 22  CREATININE 1.02 1.03  CALCIUM 8.9 8.6   Liver Function Tests: No  results for input(s): AST, ALT, ALKPHOS, BILITOT, PROT, ALBUMIN in the last 168 hours. No results for input(s): LIPASE, AMYLASE in the last 168 hours. No results for input(s): AMMONIA in the last 168 hours. CBC:  Recent Labs Lab 11/15/14 1307 11/16/14 0017  WBC 6.3 6.1  HGB 12.3 10.4*  HCT 35.7* 30.0*  MCV 98.1 99.0  PLT 242 214   Cardiac Enzymes:  Recent Labs Lab 11/15/14 1307  TROPONINI 0.03   BNP: Invalid input(s): POCBNP CBG: No results for input(s): GLUCAP in the last 168 hours. Microbiology: No results found for: CULT No results for input(s): CULT, SDES in the last 168 hours.  Imaging: Dg Chest Port 1 View  11/15/2014   CLINICAL DATA:  Left-sided chest pain began this morning. Radiates to the back  EXAM: PORTABLE CHEST - 1 VIEW  COMPARISON:  None.  FINDINGS: The heart size and mediastinal contours are within normal limits. Both lungs are clear. The visualized skeletal structures are unremarkable.  IMPRESSION: No active disease.   Electronically Signed   By: Kathreen Devoid   On: 11/15/2014 14:03     ASSESSMENT:  1. Unstable angina 2. CAD  3. Polymyositis 4. H/o embolic phenomenon with last cath 5. Anemia 6. Mild AS by recent echo (sounds more severe on exam) 7. Mild dementia  PLAN/DISCUSSION:  CP controlled on current regimen will continue. Reviewed cath in detail. I reassured them will try radial approach if possible. Given previous embolic  phenomenon and AS would avoid crossing the AoV if possible. (AS mild on recent echo but sounds worse on exam). They realize there is still risk for cath complications but want to proceed. Continue heparin and IV NTG   LOS: 1 day    Glori Bickers, MD 11/16/2014, 11:00 AM

## 2014-11-16 NOTE — Progress Notes (Signed)
ANTICOAGULATION CONSULT NOTE - Follow Up Consult  Pharmacy Consult for heparin Indication: USAP   Labs:  Recent Labs  11/15/14 1307 11/16/14 0017  HGB 12.3 10.4*  HCT 35.7* 30.0*  PLT 242 214  HEPARINUNFRC  --  0.58  CREATININE 1.02  --   TROPONINI 0.03  --      Assessment/Plan:  81yo female therapeutic on heparin with initial dosing for USAP. Will continue gtt at current rate and confirm stable with additional level.   Wynona Neat, PharmD, BCPS  11/16/2014,1:03 AM

## 2014-11-16 NOTE — Progress Notes (Signed)
ANTICOAGULATION CONSULT NOTE - Follow Up Consult  Pharmacy Consult for heparin Indication: chest pain/ACS  Allergies  Allergen Reactions  . Enalapril Maleate Cough  . Oxycodone Nausea And Vomiting    Abdominal pain  . Codeine   . Penicillins   . Fosamax [Alendronate Sodium] Other (See Comments)    Headaches    Patient Measurements: Height: 5\' 7"  (170.2 cm) Weight: 129 lb 6.6 oz (58.7 kg) IBW/kg (Calculated) : 61.6 Heparin Dosing Weight:58.7kg  Vital Signs: Temp: 98.9 F (37.2 C) (04/24 1900) Temp Source: Oral (04/24 1900) BP: 128/66 mmHg (04/24 1900) Pulse Rate: 82 (04/24 1900)  Labs:  Recent Labs  11/15/14 1307 11/16/14 0017 11/16/14 0850 11/16/14 1300 11/16/14 1325 11/16/14 1823  HGB 12.3 10.4*  --   --   --   --   HCT 35.7* 30.0*  --   --   --   --   PLT 242 214  --   --   --   --   LABPROT  --   --   --  14.8  --   --   INR  --   --   --  1.14  --   --   HEPARINUNFRC  --  0.58 0.74*  --   --  0.17*  CREATININE 1.02 1.03  --   --  1.11*  --   TROPONINI 0.03  --   --   --   --   --     Estimated Creatinine Clearance: 35.6 mL/min (by C-G formula based on Cr of 1.11).   Medications:  Infusions:  . heparin 650 Units/hr (11/16/14 1900)  . nitroGLYCERIN 35 mcg/min (11/16/14 1945)    Assessment: 49 YOF with who presented with left-sided chest pressure that radiates into her back. Patient's EKG showed ST depression with T wave inversion. Troponin negative. Pharmacy consulted to start IV heparin for ACS.  Heparin level subtherapeutic after rate decrease.   Goal of Therapy:  Heparin level 0.3-0.7 units/ml Monitor platelets by anticoagulation protocol: Yes   Plan:  Increase heparin to 750 units/hr Daily HL/CBC Monitor s/sx of bleeding  Thank you for allowing pharmacy to be part of this patient's care team  Heide Guile, PharmD, BCPS Clinical Pharmacist Pager [phone]   11/16/2014 .8:14 PM

## 2014-11-16 NOTE — Progress Notes (Signed)
ANTICOAGULATION CONSULT NOTE - Follow Up Consult  Pharmacy Consult for heparin Indication: chest pain/ACS  Allergies  Allergen Reactions  . Alendronate     HEADACHES  . Codeine   . Enalapril Maleate Cough  . Fosamax [Alendronate Sodium]   . Oxycodone Nausea And Vomiting    Nausea, abdominal pain  . Penicillins     Patient Measurements: Height: 5\' 7"  (170.2 cm) Weight: 129 lb 6.6 oz (58.7 kg) IBW/kg (Calculated) : 61.6 Heparin Dosing Weight:58.7kg  Vital Signs: Temp: 98.8 F (37.1 C) (04/24 0728) Temp Source: Oral (04/24 0728) BP: 111/87 mmHg (04/24 0800) Pulse Rate: 82 (04/24 0800)  Labs:  Recent Labs  11/15/14 1307 11/16/14 0017 11/16/14 0850  HGB 12.3 10.4*  --   HCT 35.7* 30.0*  --   PLT 242 214  --   HEPARINUNFRC  --  0.58 0.74*  CREATININE 1.02 1.03  --   TROPONINI 0.03  --   --     Estimated Creatinine Clearance: 38.3 mL/min (by C-G formula based on Cr of 1.03).   Medications:  Infusions:  . heparin 700 Units/hr (11/15/14 1536)  . nitroGLYCERIN 25 mcg/min (11/16/14 0700)    Assessment: 3 YOF with who presented with left-sided chest pressure that radiates into her back. Patient's EKG showed ST depression with T wave inversion. Troponin negative. Pharmacy consulted to start IV heparin for ACS. First HL therapeutic at 0.58, confirmatory level slightly supratherapeutic at 0.74. Hgb 12.>10.4, plts 214. RN states some bleeding from the [facility] line over night, but no current bleeding.   Goal of Therapy:  Heparin level 0.3-0.7 units/ml Monitor platelets by anticoagulation protocol: Yes   Plan:  Decrease heparin to 650 units/hr 8hr HL at 1800 Daily HL/CBC Monitor s/sx of bleeding  Thank you for allowing pharmacy to be part of this patient's care team  [place], Pharm.D Clinical Pharmacy Resident Pager: [phone] 11/16/2014 .9:58 AM

## 2014-11-16 NOTE — Care Management Note (Addendum)
    Page 1 of 1   11/19/2014     9:31:11 AM CARE MANAGEMENT NOTE 11/19/2014  Patient:  Sara Lynch, Sara Lynch   Account Number:  [id number]  Date Initiated:  11/16/2014  Documentation initiated by:  [place]  Subjective/Objective Assessment:   adm: CP which started around 1000hrs and radiates to her arms, neck/jaw and back     Action/Plan:   discharge planning   Anticipated DC Date:  11/19/2014   Anticipated DC Plan:  [place]  CM consult  Medication Assistance      [facility] Choice  HOME HEALTH   Choice offered to / List presented to:  C-4 Adult Children        HH arranged  HH-1 RN  [place].   Status of service:  Completed, signed off Medicare Important Message given?  YES (If response is "NO", the following Medicare IM given date fields will be blank) Date Medicare IM given:  11/18/2014 Medicare IM given by:  Elissa Hefty Date Additional Medicare IM given:   Additional Medicare IM given by:    Discharge Disposition:  [place]  Per UR Regulation:  Reviewed for med. necessity/level of care/duration of stay  If discussed at [place] of Stay Meetings, dates discussed:    Comments:  11/16/14 Pt lives with daughter and Henrietta Dine, [place].  Pt has advanced dementia.  MD requested with med asst however, pt has Medicare A&B and is not elligible for MATCH.  Will follow for disposition needs. Mariane Masters.

## 2014-11-16 NOTE — Progress Notes (Signed)
Utilization review completed.  

## 2014-11-17 ENCOUNTER — Encounter (HOSPITAL_COMMUNITY)
Admission: EM | Disposition: A | Discharge status: Home Health | Payer: Medicare Other | Source: Home / Self Care | Attending: Internal Medicine

## 2014-11-17 DIAGNOSIS — I214 Non-ST elevation (NSTEMI) myocardial infarction: Secondary | ICD-10-CM | POA: Insufficient documentation

## 2014-11-17 HISTORY — PX: LEFT HEART CATHETERIZATION WITH CORONARY ANGIOGRAM: SHX5451

## 2014-11-17 LAB — CBC
HEMATOCRIT: 29.1 % — AB (ref 36.0–46.0)
HEMOGLOBIN: 9.9 g/dL — AB (ref 12.0–15.0)
MCH: 33.8 pg (ref 26.0–34.0)
MCHC: 34 g/dL (ref 30.0–36.0)
MCV: 99.3 fL (ref 78.0–100.0)
Platelets: 206 10*3/uL (ref 150–400)
RBC: 2.93 MIL/uL — ABNORMAL LOW (ref 3.87–5.11)
RDW: 14.3 % (ref 11.5–15.5)
WBC: 6.8 10*3/uL (ref 4.0–10.5)

## 2014-11-17 LAB — POCT ACTIVATED CLOTTING TIME: Activated Clotting Time: 657 seconds

## 2014-11-17 LAB — TROPONIN I: Troponin I: 3.47 ng/mL (ref <0.031)

## 2014-11-17 LAB — HEMOGLOBIN A1C
Hgb A1c MFr Bld: 6.3 % — ABNORMAL HIGH (ref 4.8–5.6)
Mean Plasma Glucose: 134 mg/dL

## 2014-11-17 LAB — HEPARIN LEVEL (UNFRACTIONATED): HEPARIN UNFRACTIONATED: 0.26 [IU]/mL — AB (ref 0.30–0.70)

## 2014-11-17 SURGERY — LEFT HEART CATHETERIZATION WITH CORONARY ANGIOGRAM
Anesthesia: LOCAL

## 2014-11-17 MED ORDER — ASPIRIN 81 MG PO CHEW
81.0000 mg | CHEWABLE_TABLET | Freq: Every day | ORAL | Status: DC
Start: 2014-11-17 — End: 2014-11-17

## 2014-11-17 MED ORDER — ASPIRIN 81 MG PO CHEW
81.0000 mg | CHEWABLE_TABLET | ORAL | Status: AC
  Administered 2014-11-17: 81 mg via ORAL
  Filled 2014-11-17: qty 1

## 2014-11-17 MED ORDER — CLOPIDOGREL BISULFATE 75 MG PO TABS
75.0000 mg | ORAL_TABLET | Freq: Every day | ORAL | Status: DC
  Administered 2014-11-18 – 2014-11-19 (×2): 75 mg via ORAL
  Filled 2014-11-17 (×3): qty 1

## 2014-11-17 MED ORDER — SODIUM CHLORIDE 0.9 % IV SOLN
1.0000 mL/kg/h | INTRAVENOUS | Status: DC
Start: 2014-11-18 — End: 2014-11-17
  Administered 2014-11-17: 1 mL/kg/h via INTRAVENOUS

## 2014-11-17 MED ORDER — CLOPIDOGREL BISULFATE 300 MG PO TABS
ORAL_TABLET | ORAL | Status: AC
  Filled 2014-11-17: qty 2

## 2014-11-17 MED ORDER — BIVALIRUDIN 250 MG IV SOLR
INTRAVENOUS | Status: AC
  Filled 2014-11-17: qty 250

## 2014-11-17 MED ORDER — MIDAZOLAM HCL 2 MG/2ML IJ SOLN
INTRAMUSCULAR | Status: AC
  Filled 2014-11-17: qty 2

## 2014-11-17 MED ORDER — VERAPAMIL HCL 2.5 MG/ML IV SOLN
INTRAVENOUS | Status: AC
  Filled 2014-11-17: qty 2

## 2014-11-17 MED ORDER — HEPARIN (PORCINE) IN NACL 2-0.9 UNIT/ML-% IJ SOLN
INTRAMUSCULAR | Status: AC
  Filled 2014-11-17: qty 1500

## 2014-11-17 MED ORDER — SODIUM CHLORIDE 0.9 % IV SOLN
1.0000 mL/kg/h | INTRAVENOUS | Status: AC
  Administered 2014-11-17: 1 mL/kg/h via INTRAVENOUS

## 2014-11-17 MED ORDER — FENTANYL CITRATE (PF) 100 MCG/2ML IJ SOLN
INTRAMUSCULAR | Status: AC
  Filled 2014-11-17: qty 2

## 2014-11-17 MED ORDER — NITROGLYCERIN 1 MG/10 ML FOR IR/CATH LAB
INTRA_ARTERIAL | Status: AC
  Filled 2014-11-17: qty 10

## 2014-11-17 MED ORDER — SODIUM CHLORIDE 0.9 % IV SOLN
250.0000 mL | INTRAVENOUS | Status: DC | PRN
Start: 2014-11-17 — End: 2014-11-17

## 2014-11-17 MED ORDER — HEPARIN (PORCINE) IN NACL 100-0.45 UNIT/ML-% IJ SOLN
850.0000 [IU]/h | INTRAMUSCULAR | Status: DC
  Administered 2014-11-17: 850 [IU]/h via INTRAVENOUS
  Filled 2014-11-17: qty 250

## 2014-11-17 MED ORDER — LIDOCAINE HCL (PF) 1 % IJ SOLN
INTRAMUSCULAR | Status: AC
Start: 2014-11-17 — End: 2014-11-17
  Filled 2014-11-17: qty 30

## 2014-11-17 NOTE — Progress Notes (Signed)
CRITICAL VALUE ALERT  Critical value received:  troponin 3.47  Date of notification:  11/17/2014  Time of notification:  0858  Critical value read back:Yes.    Nurse who received alert:  Rosebud Poles RN  MD notified (1st page):  Dr. Mare Ferrari  Time of first page:  0900  Responding MD:  Dr. Mare Ferrari  Time MD responded:  0900

## 2014-11-17 NOTE — Progress Notes (Signed)
Patient Name: Sara Lynch Date of Encounter: 11/17/2014     Principal Problem:   Unstable angina Active Problems:   Hyperlipidemia   History of thromboembolism after heart cath   CAD (coronary artery disease)    SUBJECTIVE  Patient had more chest pain earlier this am. IV NTG was increased and pain was relieved. EKG this am shows increased ischemic change. Troponin now up to 3.47. She is concerned about cath because of previous cath with embolic shower which caused transient kidney failure. Did not require dialysis. Last cath was from groin. Previous doctors warned not to have future caths. Also warned about crossing the aortic valve. Echo in January 2016 showed only mild stenosis and mild insufficiency.  CURRENT MEDS . amLODipine  2.5 mg Oral Daily  . aspirin  81 mg Oral Daily  . carvedilol  12.5 mg Oral BID AC  . pravastatin  40 mg Oral Daily  . sodium chloride  3 mL Intravenous Q12H    OBJECTIVE  Filed Vitals:   11/17/14 0400 11/17/14 0615 11/17/14 0800 11/17/14 0914  BP: 92/40 104/48  126/63  Pulse: 81 77  80  Temp:   98.6 F (37 C)   TempSrc:   Oral   Resp: 16 19    Height:      Weight:      SpO2: 92% 96%      Intake/Output Summary (Last 24 hours) at 11/17/14 0917 Last data filed at 11/17/14 0800  Gross per 24 hour  Intake 956.96 ml  Output   1400 ml  Net -443.04 ml   Filed Weights   11/15/14 1251 11/15/14 1630  Weight: 133 lb (60.328 kg) 129 lb 6.6 oz (58.7 kg)    PHYSICAL EXAM  General: Pleasant, NAD. Neuro: Alert and oriented X 3. Moves all extremities spontaneously. Psych: Normal affect. HEENT:  Normal  Neck: Supple without bruits or JVD. Lungs:  Resp regular and unlabored, CTA. Heart: RRR no s3, s4, There is a grade 2/6 systolic murmur at apex and base. Abdomen: Soft, non-tender, non-distended, BS + x 4.  Extremities: No clubbing, cyanosis or edema. DP/PT/Radials 2+ and equal bilaterally.  Accessory Clinical Findings  CBC  Recent Labs  11/16/14 0017 11/17/14 0553  WBC 6.1 6.8  HGB 10.4* 9.9*  HCT 30.0* 29.1*  MCV 99.0 99.3  PLT 214 361   Basic Metabolic Panel  Recent Labs  11/16/14 0017 11/16/14 1325  NA 137 140  K 3.9 3.7  CL 102 107  CO2 25 25  GLUCOSE 125* 132*  BUN 22 23  CREATININE 1.03 1.11*  CALCIUM 8.6 9.0   Liver Function Tests No results for input(s): AST, ALT, ALKPHOS, BILITOT, PROT, ALBUMIN in the last 72 hours. No results for input(s): LIPASE, AMYLASE in the last 72 hours. Cardiac Enzymes  Recent Labs  11/15/14 1307 11/17/14 0553  TROPONINI 0.03 3.47*   BNP Invalid input(s): POCBNP D-Dimer No results for input(s): DDIMER in the last 72 hours. Hemoglobin A1C No results for input(s): HGBA1C in the last 72 hours. Fasting Lipid Panel  Recent Labs  11/16/14 0017  CHOL 132  HDL 49  LDLCALC 72  TRIG 56  CHOLHDL 2.7   Thyroid Function Tests  Recent Labs  11/15/14 1754  TSH 3.665    TELE  NSR  ECG  Normal sinus rhythm Left ventricular hypertrophy with repolarization abnormality Abnormal ECG Worsening inferolateral T wave changes since 11/16/14 evoliving IMI with hyperacute anterior T waves Confirmed by [facility] MD, PETER [phone]) on  11/17/2014 8:11:37 AM Also confirmed by Ron Parker MD, JEFF [phone]) on 11/17/2014 8:51:31 AM  Radiology/Studies  Dg Chest Port 1 View  11/15/2014   CLINICAL DATA:  Left-sided chest pain began this morning. Radiates to the back  EXAM: PORTABLE CHEST - 1 VIEW  COMPARISON:  None.  FINDINGS: The heart size and mediastinal contours are within normal limits. Both lungs are clear. The visualized skeletal structures are unremarkable.  IMPRESSION: No active disease.   Electronically Signed   By: Kathreen Devoid   On: 11/15/2014 14:03    ASSESSMENT AND PLAN  1.NSTEMI 2.Mild aortic stenosis.  Plan: cath today by radial route.  Signed, Darlin Coco MD

## 2014-11-17 NOTE — CV Procedure (Signed)
    Cardiac Catheterization Procedure Note  Name: Sara Lynch MRN: [id number] DOB: [birth year]  Procedure: Selective Coronary Angiography,  PTCA and stenting of the Proximal/ostial RCA.  Indication: 81 yo WF presents with a NSTEMI and continues to have refractory chest pain despite maximal medical therapy. She is s/p stenting of the ostium of the RCA in 2009 with a 2.75 x 15 and 3.0 x 8 mm Xience stents.   Procedural Details:  The right wrist was prepped, draped, and anesthetized with 1% lidocaine. Using the modified Seldinger technique, a 6 French slender sheath was introduced into the right radial artery. 3 mg of verapamil was administered through the sheath, weight-based unfractionated heparin was administered intravenously. Standard Judkins catheters were used for selective coronary angiography. Catheter exchanges were performed over an exchange length guidewire.  PROCEDURAL FINDINGS Hemodynamics: AO 123/57 mean 86 mm Hg LV N/A   Coronary angiography: Coronary dominance: right  Left mainstem: 30% ostial Left main.   Left anterior descending (LAD): The LAD is tortuous with mild calcification. There are scattered 20% lesions.  Left circumflex (LCx): Tortuous with 30% disease in the proximal and mid vessel.   Right coronary artery (RCA): The RCA is heavily calcified proximally. There is a stent in place. There is diffuse 50-70% in stent stenosis. There is a 99% stenosis at what appears to be the distal stent margin. This is followed by a 70% stenosis in the proximal vessel. The RCA is a large dominant vessel. There were left to right collaterals to the PDA.  Left ventriculography: N/A  PCI Note:  Following the diagnostic procedure, the decision was made to proceed with PCI of the RCA.  Weight-based bivalirudin was given for anticoagulation. Plavix 600 mg was given orally. Once a therapeutic ACT was achieved, a 6 [place] LA 0.75 guide catheter was inserted.  A Choice PT MS coronary  guidewire was used to cross the lesion.  The lesion was difficult to cross with balloons and was predilated progressively with 2.0 mm, 2.5 mm, and 3.5 mm balloons. Despite repeated attempts we were unable to pass an IVUS catheter. By angiography it appeared the prior stents were grossly undersized. Despite good expansion with a 3.5 mm balloon we were unable to deliver a stent. Using a Guideliner catheter over a 2.5 mm balloon we were able to advance the Guideliner past the lesion.  The lesion was then stented with a 3.5 x 38 mm Synergy stent covering the entire segment of disease and extending outside the ostium.  The stent was postdilated with a 3.75 mm noncompliant balloon.  Following PCI, there was 0% residual stenosis and TIMI-3 flow. Final angiography confirmed an excellent result. The patient tolerated the procedure well. There were no immediate procedural complications. A TR band was used for radial hemostasis. The patient was transferred to the post catheterization recovery area for further monitoring. Contrast 120 cc.   PCI Data: Vessel - RCA/Segment - ostial/proximal Percent Stenosis (pre)  99% TIMI-flow 2 Stent 3.5 x 38 mm Synergy stent. Percent Stenosis (post) 0% TIMI-flow (post) 3  Final Conclusions:   1. Single vessel obstructive CAD with de novo and in-stent stenosis in the ostium and proximal RCA. 2. Successful stenting of the ostial/proximal RCA with DES.    Recommendations:  Continue DAPT with ASA and Plavix indefinitely. Given frail status will ambulate tomorrow and consider DC Wednesday if no complications.  Braxtyn Dorff [place], [place] 11/17/2014, 4:56 PM

## 2014-11-17 NOTE — Progress Notes (Signed)
ANTICOAGULATION CONSULT NOTE - Follow Up Consult  Pharmacy Consult for heparin Indication: chest pain/ACS   Allergies  Allergen Reactions  . Enalapril Maleate Cough  . Oxycodone Nausea And Vomiting    Abdominal pain  . Codeine   . Penicillins   . Fosamax [Alendronate Sodium] Other (See Comments)    Headaches    Patient Measurements: Height: 5\' 7"  (170.2 cm) Weight: 129 lb 6.6 oz (58.7 kg) IBW/kg (Calculated) : 61.6 Heparin Dosing Weight: 58.7 kg  Vital Signs: Temp: 98.6 F (37 C) (04/25 0800) Temp Source: Oral (04/25 0800) BP: 104/48 mmHg (04/25 0615) Pulse Rate: 77 (04/25 0615)  Labs:  Recent Labs  11/15/14 1307  11/16/14 0017 11/16/14 0850 11/16/14 1300 11/16/14 1325 11/16/14 1823 11/17/14 0553  HGB 12.3  --  10.4*  --   --   --   --  9.9*  HCT 35.7*  --  30.0*  --   --   --   --  29.1*  PLT 242  --  214  --   --   --   --  206  LABPROT  --   --   --   --  14.8  --   --   --   INR  --   --   --   --  1.14  --   --   --   HEPARINUNFRC  --   < > 0.58 0.74*  --   --  0.17* 0.26*  CREATININE 1.02  --  1.03  --   --  1.11*  --   --   TROPONINI 0.03  --   --   --   --   --   --   --   < > = values in this interval not displayed.  Estimated Creatinine Clearance: 35.6 mL/min (by C-G formula based on Cr of 1.11).  Derrill Memo ON 11/18/2014] sodium chloride 1 mL/kg/hr (11/17/14 0421)  . heparin 750 Units/hr (11/17/14 0700)  . nitroGLYCERIN 30 mcg/min (11/17/14 0700)    Assessment: 60 YOF with who presented with left-sided chest pressure that radiates into her back. Patient's EKG showed ST depression with T wave inversion. Troponin negative. Pharmacy consulted to start IV heparin for ACS. H/H 10.4/30 >> 9.9/29.1 PLT 206.No s/sx bleed. HL 0.26.   Goal of Therapy:  Heparin level 0.3-0.7 units/ml Monitor platelets by anticoagulation protocol: Yes   Plan:  Increase heparin gtt to 850 units/h  F/u 1730 HL  F/u daily HL, CBC  Continue to monitor H&H and platelets   Monitor s/sx bleed  Ginny Forth 11/17/2014,8:44 AM

## 2014-11-17 NOTE — Interval H&P Note (Signed)
History and Physical Interval Note:  11/17/2014 2:58 PM  Sara Lynch  has presented today for surgery, with the diagnosis of unstable angina  The various methods of treatment have been discussed with the patient and family. After consideration of risks, benefits and other options for treatment, the patient has consented to  Procedure(s): LEFT HEART CATHETERIZATION WITH CORONARY ANGIOGRAM (N/A) as a surgical intervention .  The patient's history has been reviewed, patient examined, no change in status, stable for surgery.  I have reviewed the patient's chart and labs.  Questions were answered to the patient's satisfaction.   Cath Lab Visit (complete for each Cath Lab visit)  Clinical Evaluation Leading to the Procedure:   ACS: Yes.    Non-ACS:    Anginal Classification: CCS IV  Anti-ischemic medical therapy: Maximal Therapy (2 or more classes of medications)  Non-Invasive Test Results: No non-invasive testing performed  Prior CABG: No previous CABG        Sara Lynch [facility] 11/17/2014 2:58 PM

## 2014-11-17 NOTE — H&P (View-Only) (Signed)
Patient Name: Sara Lynch Date of Encounter: 11/17/2014     Principal Problem:   Unstable angina Active Problems:   Hyperlipidemia   History of thromboembolism after heart cath   CAD (coronary artery disease)    SUBJECTIVE  Patient had more chest pain earlier this am. IV NTG was increased and pain was relieved. EKG this am shows increased ischemic change. Troponin now up to 3.47. She is concerned about cath because of previous cath with embolic shower which caused transient kidney failure. Did not require dialysis. Last cath was from groin. Previous doctors warned not to have future caths. Also warned about crossing the aortic valve. Echo in January 2016 showed only mild stenosis and mild insufficiency.  CURRENT MEDS . amLODipine  2.5 mg Oral Daily  . aspirin  81 mg Oral Daily  . carvedilol  12.5 mg Oral BID AC  . pravastatin  40 mg Oral Daily  . sodium chloride  3 mL Intravenous Q12H    OBJECTIVE  Filed Vitals:   11/17/14 0400 11/17/14 0615 11/17/14 0800 11/17/14 0914  BP: 92/40 104/48  126/63  Pulse: 81 77  80  Temp:   98.6 F (37 C)   TempSrc:   Oral   Resp: 16 19    Height:      Weight:      SpO2: 92% 96%      Intake/Output Summary (Last 24 hours) at 11/17/14 0917 Last data filed at 11/17/14 0800  Gross per 24 hour  Intake 956.96 ml  Output   1400 ml  Net -443.04 ml   Filed Weights   11/15/14 1251 11/15/14 1630  Weight: 133 lb (60.328 kg) 129 lb 6.6 oz (58.7 kg)    PHYSICAL EXAM  General: Pleasant, NAD. Neuro: Alert and oriented X 3. Moves all extremities spontaneously. Psych: Normal affect. HEENT:  Normal  Neck: Supple without bruits or JVD. Lungs:  Resp regular and unlabored, CTA. Heart: RRR no s3, s4, There is a grade 2/6 systolic murmur at apex and base. Abdomen: Soft, non-tender, non-distended, BS + x 4.  Extremities: No clubbing, cyanosis or edema. DP/PT/Radials 2+ and equal bilaterally.  Accessory Clinical Findings  CBC  Recent Labs  11/16/14 0017 11/17/14 0553  WBC 6.1 6.8  HGB 10.4* 9.9*  HCT 30.0* 29.1*  MCV 99.0 99.3  PLT 214 803   Basic Metabolic Panel  Recent Labs  11/16/14 0017 11/16/14 1325  NA 137 140  K 3.9 3.7  CL 102 107  CO2 25 25  GLUCOSE 125* 132*  BUN 22 23  CREATININE 1.03 1.11*  CALCIUM 8.6 9.0   Liver Function Tests No results for input(s): AST, ALT, ALKPHOS, BILITOT, PROT, ALBUMIN in the last 72 hours. No results for input(s): LIPASE, AMYLASE in the last 72 hours. Cardiac Enzymes  Recent Labs  11/15/14 1307 11/17/14 0553  TROPONINI 0.03 3.47*   BNP Invalid input(s): POCBNP D-Dimer No results for input(s): DDIMER in the last 72 hours. Hemoglobin A1C No results for input(s): HGBA1C in the last 72 hours. Fasting Lipid Panel  Recent Labs  11/16/14 0017  CHOL 132  HDL 49  LDLCALC 72  TRIG 56  CHOLHDL 2.7   Thyroid Function Tests  Recent Labs  11/15/14 1754  TSH 3.665    TELE  NSR  ECG  Normal sinus rhythm Left ventricular hypertrophy with repolarization abnormality Abnormal ECG Worsening inferolateral T wave changes since 11/16/14 evoliving IMI with hyperacute anterior T waves Confirmed by [facility] MD, PETER [phone]) on  11/17/2014 8:11:37 AM Also confirmed by Ron Parker MD, JEFF [phone]) on 11/17/2014 8:51:31 AM  Radiology/Studies  Dg Chest Port 1 View  11/15/2014   CLINICAL DATA:  Left-sided chest pain began this morning. Radiates to the back  EXAM: PORTABLE CHEST - 1 VIEW  COMPARISON:  None.  FINDINGS: The heart size and mediastinal contours are within normal limits. Both lungs are clear. The visualized skeletal structures are unremarkable.  IMPRESSION: No active disease.   Electronically Signed   By: Kathreen Devoid   On: 11/15/2014 14:03    ASSESSMENT AND PLAN  1.NSTEMI 2.Mild aortic stenosis.  Plan: cath today by radial route.  Signed, Darlin Coco MD

## 2014-11-18 ENCOUNTER — Encounter (HOSPITAL_COMMUNITY): Payer: Self-pay | Admitting: Cardiology

## 2014-11-18 DIAGNOSIS — I214 Non-ST elevation (NSTEMI) myocardial infarction: Secondary | ICD-10-CM

## 2014-11-18 LAB — CBC
HCT: 30.1 % — ABNORMAL LOW (ref 36.0–46.0)
Hemoglobin: 10.2 g/dL — ABNORMAL LOW (ref 12.0–15.0)
MCH: 34.7 pg — ABNORMAL HIGH (ref 26.0–34.0)
MCHC: 33.9 g/dL (ref 30.0–36.0)
MCV: 102.4 fL — ABNORMAL HIGH (ref 78.0–100.0)
PLATELETS: 184 10*3/uL (ref 150–400)
RBC: 2.94 MIL/uL — ABNORMAL LOW (ref 3.87–5.11)
RDW: 14.6 % (ref 11.5–15.5)
WBC: 6.1 10*3/uL (ref 4.0–10.5)

## 2014-11-18 LAB — BASIC METABOLIC PANEL
Anion gap: 9 (ref 5–15)
BUN: 23 mg/dL (ref 6–23)
CO2: 21 mmol/L (ref 19–32)
CREATININE: 1 mg/dL (ref 0.50–1.10)
Calcium: 8.3 mg/dL — ABNORMAL LOW (ref 8.4–10.5)
Chloride: 108 mmol/L (ref 96–112)
GFR calc non Af Amer: 51 mL/min — ABNORMAL LOW (ref >90)
GFR, EST AFRICAN AMERICAN: 59 mL/min — AB (ref >90)
GLUCOSE: 136 mg/dL — AB (ref 70–99)
Potassium: 4 mmol/L (ref 3.5–5.1)
SODIUM: 138 mmol/L (ref 135–145)

## 2014-11-18 MED ORDER — POLYETHYLENE GLYCOL 3350 17 G PO PACK
17.0000 g | PACK | Freq: Every day | ORAL | Status: DC | PRN
  Administered 2014-11-18: 17 g via ORAL
  Filled 2014-11-18: qty 1

## 2014-11-18 MED FILL — Sodium Chloride IV Soln 0.9%: INTRAVENOUS | Qty: 50 | Status: AC

## 2014-11-18 NOTE — Progress Notes (Signed)
There were several occurences of bleeding from the right radial site when removing air from the TR band. Each time air was reinserted into the band until hemostasis. Air removal started at 19:30 11/17/14 and all air was finally removed by 23:15 11/18/14  The TR band was removed at 00:15 11/18/14. The site is a level 1 with some bruising around the area but no hematoma. Sara Lynch was placed over the site with a transparent dressing.  Will continue to monitor.

## 2014-11-18 NOTE — Progress Notes (Signed)
CARDIAC REHAB PHASE I   PRE:  Rate/Rhythm: 72 Sr  BP:  Sitting: 147/89        SaO2: 97 RA  MODE:  Ambulation: 350 ft   POST:  Rate/Rhythm: 98 SR c/ occasional PVC  BP:  Sitting: 204/101, recheck 194/86, 187/83         SaO2: 98 Ra  Pt ambulated 350 ft on RA, handheld assist, gait belt, mild unsteady gait, tolerated well.  Pt denies cp, dizziness, SOB, declined rest stop. Completed stent education. Pt daughter at bedside. Also spoke with pt's other daughter Rosanne Ashing, (who lives with her) regarding phase 2 cardiac rehab and possibility of HHPT. Pt daughter states she is interested in both. Pt would benefit from HHPT for safety eval and activity progression, gait stability. Pt was mostly sedentary per pt and family due to chest pain prior to hospital stay. Reviewed anti-platelet therapy, stent card, activity restrictions, ntg, exercise, heart healthy diet, phase 2 cardiac rehab. Pt and daughter verbalized understanding. Pt agrees to phase 2 cardiac rehab. Will send referral to [facility]   [id number]   Lenna Sciara, RN, BSN 11/18/2014 2:40 PM

## 2014-11-18 NOTE — Progress Notes (Signed)
Patient Name: Sara Lynch Date of Encounter: 11/18/2014     Principal Problem:   Unstable angina Active Problems:   Hyperlipidemia   History of thromboembolism after heart cath   CAD (coronary artery disease)   NSTEMI (non-ST elevated myocardial infarction)    SUBJECTIVE  The patient feels well.  She has had no chest discomfort since her procedure yesterday.  At catheter yesterday she was found to have: 1. Single vessel obstructive CAD with de novo and in-stent stenosis in the ostium and proximal RCA. 2. Successful stenting of the ostial/proximal RCA with DES.  She had some minor bleeding problems from the catheter site overnight which is stable this morning. She is on Pravachol.  She has had a prior trial of Lipitor but developed exacerbation of myositis.  CURRENT MEDS . amLODipine  2.5 mg Oral Daily  . aspirin  81 mg Oral Daily  . carvedilol  12.5 mg Oral BID AC  . clopidogrel  75 mg Oral Q breakfast  . pravastatin  40 mg Oral Daily    OBJECTIVE  Filed Vitals:   11/18/14 0700 11/18/14 0719 11/18/14 0800 11/18/14 0807  BP: 111/42 111/42 119/35   Pulse: 69 72 71 79  Temp:  98 F (36.7 C)    TempSrc:  Oral    Resp: 17 11 21    Height:      Weight:      SpO2: 93% 96% 97%     Intake/Output Summary (Last 24 hours) at 11/18/14 0905 Last data filed at 11/18/14 0109  Gross per 24 hour  Intake 1145.97 ml  Output   1100 ml  Net  45.97 ml   Filed Weights   11/15/14 1251 11/15/14 1630  Weight: 133 lb (60.328 kg) 129 lb 6.6 oz (58.7 kg)    PHYSICAL EXAM  General: Pleasant, NAD. Neuro: Alert and oriented X 3. Moves all extremities spontaneously. Psych: Normal affect. HEENT:  Normal  Neck: Supple without bruits or JVD. Lungs:  Resp regular and unlabored, CTA. Heart: RRR no s3, s4, There is a grade 2/6 systolic murmur at apex and base. Abdomen: Soft, non-tender, non-distended, BS + x 4.  Extremities: No clubbing, cyanosis or edema. DP/PT/Radials 2+ and equal  bilaterally.  Accessory Clinical Findings  CBC  Recent Labs  11/17/14 0553 11/18/14 0343  WBC 6.8 6.1  HGB 9.9* 10.2*  HCT 29.1* 30.1*  MCV 99.3 102.4*  PLT 206 324   Basic Metabolic Panel  Recent Labs  11/16/14 1325 11/18/14 0343  NA 140 138  K 3.7 4.0  CL 107 108  CO2 25 21  GLUCOSE 132* 136*  BUN 23 23  CREATININE 1.11* 1.00  CALCIUM 9.0 8.3*   Liver Function Tests No results for input(s): AST, ALT, ALKPHOS, BILITOT, PROT, ALBUMIN in the last 72 hours. No results for input(s): LIPASE, AMYLASE in the last 72 hours. Cardiac Enzymes  Recent Labs  11/15/14 1307 11/17/14 0553  TROPONINI 0.03 3.47*   BNP Invalid input(s): POCBNP D-Dimer No results for input(s): DDIMER in the last 72 hours. Hemoglobin A1C  Recent Labs  11/15/14 1754  HGBA1C 6.3*   Fasting Lipid Panel  Recent Labs  11/16/14 0017  CHOL 132  HDL 49  LDLCALC 72  TRIG 56  CHOLHDL 2.7   Thyroid Function Tests  Recent Labs  11/15/14 1754  TSH 3.665    TELE  NSR  ECG  EKG today shows improvement in inferolateral T-wave abnormalities since yesterday.  Personally reviewed. Radiology/Studies  Dg Chest  Port 1 View  11/15/2014   CLINICAL DATA:  Left-sided chest pain began this morning. Radiates to the back  EXAM: PORTABLE CHEST - 1 VIEW  COMPARISON:  None.  FINDINGS: The heart size and mediastinal contours are within normal limits. Both lungs are clear. The visualized skeletal structures are unremarkable.  IMPRESSION: No active disease.   Electronically Signed   By: Kathreen Devoid   On: 11/15/2014 14:03    ASSESSMENT AND PLAN  1.NSTEMI.  Successful stenting of ostial/proximal RCA with drug-eluting stent. 2.Mild aortic stenosis.  Plan: Continue current medication.  Advance activity today.  Anticipate home tomorrow if stable  Signed, Darlin Coco MD

## 2014-11-19 ENCOUNTER — Telehealth: Payer: Self-pay | Admitting: Nurse Practitioner

## 2014-11-19 MED ORDER — PANTOPRAZOLE SODIUM 40 MG PO TBEC: 40.0000 mg | DELAYED_RELEASE_TABLET | Freq: Every day | ORAL | Status: DC

## 2014-11-19 MED ORDER — ASPIRIN 81 MG PO CHEW: 81.0000 mg | CHEWABLE_TABLET | Freq: Every day | ORAL | Status: DC

## 2014-11-19 MED ORDER — CLOPIDOGREL BISULFATE 75 MG PO TABS: 75.0000 mg | ORAL_TABLET | Freq: Every day | ORAL | Status: DC

## 2014-11-19 NOTE — Progress Notes (Signed)
CARDIAC REHAB PHASE I   Pt getting ready to ambulate with PT, home health PT/RN has been arranged. Pt has no complaints this morning, states she is ready to go home. Pt daughter, Rosanne Ashing at bedside, states she has no questions. Reinforced anti-platelet therapy and phase 2. Pt and daughter verbalized understanding.   [id number]  Lenna Sciara, RN, BSN 11/19/2014 10:43 AM

## 2014-11-19 NOTE — Evaluation (Signed)
Physical Therapy Evaluation Patient Details Name: Sara Lynch MRN: [id number] DOB: [birth year] Today's Date: 11/19/2014   History of Present Illness  Pt presents with chest pain, NSTEMI, underwent PTCA and stenting of the Proximal/ostial RCA. PMH: CAD, polymyositis LE's  Clinical Impression  Agree with rec for HHPT before outpt cardiac rehab begins. Pt at supervision level with mobility, generalized weakness of BLE and recommendation for consistent use of her cane. PT will defer further treatment to [facility] setting.     Follow Up Recommendations Home health PT;Supervision for mobility/OOB    Equipment Recommendations  None recommended by PT    Recommendations for Other Services       Precautions / Restrictions Precautions Precautions: Fall Precaution Comments: pt reports one bad fall at home with right wrist fx Restrictions Weight Bearing Restrictions: No      Mobility  Bed Mobility Overal bed mobility: Independent                Transfers Overall transfer level: Modified independent Equipment used: None             General transfer comment: increased time but no physical assist needed  Ambulation/Gait Ambulation/Gait assistance: Supervision Ambulation Distance (Feet): 150 Feet Assistive device: Straight cane Gait Pattern/deviations: Step-through pattern Gait velocity: decreased, between 1.8 and 2.62 ft/sec, household ambulator but not curently appropriate for community distances   General Gait Details: Shortened step length, guarded gait. Ambulated with cane on left and education given on safety with cane, pt able to demo safe technique. Instructed pt to use SPC consistently when first home to make cane use a habit. Close supervision for safety with ambulation  Stairs            Wheelchair Mobility    Modified Rankin (Stroke Patients Only)       Balance Overall balance assessment: Needs assistance Sitting-balance support: No upper extremity  supported Sitting balance-Leahy Scale: Good     Standing balance support: No upper extremity supported;During functional activity Standing balance-Leahy Scale: Fair Standing balance comment: pt keeps wt within BOS, avoidant of wt-shift                             Pertinent Vitals/Pain Pain Assessment: No/denies pain    Home Living Family/patient expects to be discharged to:: Private residence Living Arrangements: Children Available Help at Discharge: Family;Available PRN/intermittently Type of Home: House Home Access: Stairs to enter Entrance Stairs-Rails: Right Entrance Stairs-Number of Steps: 3 Home Layout: One level Home Equipment: Cane - single point Additional Comments: pt does not use her cane consistently    Prior Function Level of Independence: Needs assistance   Gait / Transfers Assistance Needed: independent but activity level limited by chest pain  ADL's / Homemaking Assistance Needed: assistance for home mgmt  Comments: pt lives with her daughter, moved her when dementia began to worsen     Hand Dominance   Dominant Hand: Left    Extremity/Trunk Assessment   Upper Extremity Assessment: Generalized weakness           Lower Extremity Assessment: Generalized weakness;LLE deficits/detail   LLE Deficits / Details: genuvalgus, foot pronation due to advancement of polymyositis and arthritis.   Cervical / Trunk Assessment: Normal  Communication   Communication: No difficulties  Cognition Arousal/Alertness: Awake/alert Behavior During Therapy: WFL for tasks assessed/performed Overall Cognitive Status: History of cognitive impairments - at baseline       Memory: Decreased short-term memory  General Comments      Exercises Other Exercises Other Exercises: discussed HEP for LE strengthening and f/u with HHPT      Assessment/Plan    PT Assessment All further PT needs can be met in the next venue of care  PT  Diagnosis Generalized weakness   PT Problem List Decreased strength;Decreased balance;Decreased mobility;Decreased knowledge of use of DME;Decreased knowledge of precautions  PT Treatment Interventions     PT Goals (Current goals can be found in the Care Plan section) Acute Rehab PT Goals Patient Stated Goal: return home today PT Goal Formulation: All assessment and education complete, DC therapy    Frequency     Barriers to discharge        Co-evaluation               End of Session   Activity Tolerance: Patient tolerated treatment well Patient left: in chair;with call bell/phone within reach;with family/visitor present Nurse Communication: Mobility status         Time: 4270-6237 PT Time Calculation (min) (ACUTE ONLY): 23 min   Charges:   PT Evaluation $Initial PT Evaluation Tier I: 1 Procedure PT Treatments $Gait Training: 8-22 mins   PT G Codes:       Leighton Roach, PT  Acute Rehab Services  [place], [place] 11/19/2014, 11:13 AM

## 2014-11-19 NOTE — Telephone Encounter (Signed)
New message      TCM appt on 11-26-14 with Sara Lynch.

## 2014-11-19 NOTE — Progress Notes (Signed)
Patient Name: Sara Lynch Date of Encounter: 11/19/2014     Principal Problem:   Unstable angina Active Problems:   Hyperlipidemia   History of thromboembolism after heart cath   CAD (coronary artery disease)   NSTEMI (non-ST elevated myocardial infarction)    SUBJECTIVE  The patient feels well.  No chest pain since her PCI.  She walked in the hall.  Rhythm is stable.  She is anxious to go home today.  Her procedure demonstrated: 1. Single vessel obstructive CAD with de novo and in-stent stenosis in the ostium and proximal RCA. 2. Successful stenting of the ostial/proximal RCA with DES.  She had some minor bleeding problems from the catheter site overnight which is stable this morning. She is on Pravachol.  She has had a prior trial of Lipitor but developed exacerbation of myositis.  CURRENT MEDS . amLODipine  2.5 mg Oral Daily  . aspirin  81 mg Oral Daily  . carvedilol  12.5 mg Oral BID AC  . clopidogrel  75 mg Oral Q breakfast  . pravastatin  40 mg Oral Daily    OBJECTIVE  Filed Vitals:   11/19/14 0500 11/19/14 0600 11/19/14 0700 11/19/14 0745  BP: 112/47 106/44 108/94 132/55  Pulse: 70 75 70 65  Temp:    98.2 F (36.8 C)  TempSrc:    Oral  Resp: 19 20 17 16   Height:      Weight:      SpO2: 92% 94% 95% 95%    Intake/Output Summary (Last 24 hours) at 11/19/14 0849 Last data filed at 11/19/14 0800  Gross per 24 hour  Intake    740 ml  Output    850 ml  Net   -110 ml   Filed Weights   11/15/14 1251 11/15/14 1630  Weight: 133 lb (60.328 kg) 129 lb 6.6 oz (58.7 kg)    PHYSICAL EXAM  General: Pleasant, NAD. Neuro: Alert and oriented X 3. Moves all extremities spontaneously. Psych: Normal affect. HEENT:  Normal  Neck: Supple without bruits or JVD. Lungs:  Resp regular and unlabored, CTA. Heart: RRR no s3, s4, There is a grade 2/6 systolic murmur at apex and base. Abdomen: Soft, non-tender, non-distended, BS + x 4.  Extremities: No clubbing, cyanosis  or edema. DP/PT/Radials 2+ and equal bilaterally.  Accessory Clinical Findings  CBC  Recent Labs  11/17/14 0553 11/18/14 0343  WBC 6.8 6.1  HGB 9.9* 10.2*  HCT 29.1* 30.1*  MCV 99.3 102.4*  PLT 206 539   Basic Metabolic Panel  Recent Labs  11/16/14 1325 11/18/14 0343  NA 140 138  K 3.7 4.0  CL 107 108  CO2 25 21  GLUCOSE 132* 136*  BUN 23 23  CREATININE 1.11* 1.00  CALCIUM 9.0 8.3*   Liver Function Tests No results for input(s): AST, ALT, ALKPHOS, BILITOT, PROT, ALBUMIN in the last 72 hours. No results for input(s): LIPASE, AMYLASE in the last 72 hours. Cardiac Enzymes  Recent Labs  11/17/14 0553  TROPONINI 3.47*   BNP Invalid input(s): POCBNP D-Dimer No results for input(s): DDIMER in the last 72 hours. Hemoglobin A1C No results for input(s): HGBA1C in the last 72 hours. Fasting Lipid Panel No results for input(s): CHOL, HDL, LDLCALC, TRIG, CHOLHDL, LDLDIRECT in the last 72 hours. Thyroid Function Tests No results for input(s): TSH, T4TOTAL, T3FREE, THYROIDAB in the last 72 hours.  Invalid input(s): FREET3  TELE  NSR  ECG  EKG today shows improvement in inferolateral T-wave abnormalities since  yesterday.  Personally reviewed. Radiology/Studies  Dg Chest Port 1 View  11/15/2014   CLINICAL DATA:  Left-sided chest pain began this morning. Radiates to the back  EXAM: PORTABLE CHEST - 1 VIEW  COMPARISON:  None.  FINDINGS: The heart size and mediastinal contours are within normal limits. Both lungs are clear. The visualized skeletal structures are unremarkable.  IMPRESSION: No active disease.   Electronically Signed   By: Kathreen Devoid   On: 11/15/2014 14:03    ASSESSMENT AND PLAN  1.NSTEMI.  Successful stenting of ostial/proximal RCA with drug-eluting stent. 2.Mild aortic stenosis.  Plan: Continue current medication.  Discharge home today.  Follow-up with Dr. Jenkins Rouge We will arrange for home health physical therapy.  We will further to phase 2  cardiac rehabilitation program.  Signed, Darlin Coco MD

## 2014-11-19 NOTE — Discharge Summary (Signed)
Physician Discharge Summary    Cardiologist: Johnsie Cancel  Patient ID: Sara Lynch MRN: [id number] DOB/AGE: 1928/12/20 81 y.o.  Admit date: 11/15/2014 Discharge date: 11/19/2014  Admission Diagnoses: NSTEMI  Discharge Diagnoses:  Principal Problem:   Unstable angina Active Problems:   Hyperlipidemia   History of thromboembolism after heart cath   CAD (coronary artery disease)   NSTEMI (non-ST elevated myocardial infarction)   Discharged Condition: stable  Hospital Course:  81 y.o. referred from [facility] with [place]. Her daughter Sara Lynch is her health care power of attorney. The patient has advancing dementia with LE weakness from polymyositis and moved to [facility] to live with her oldest daughter. She has CAD that is ill defined Cath in 0160 complicated by embolic shower with stroke and she was cautioned never to have another. She has chronic chest pain. Admitted to [facility] April with MI. This occurred 2 days after starting Ranexa. Daughter thinks some of her "angina" is anxiety driven. Majority of episodes at night.  She has bilateral LE weakness from polymyositis and PT/rehab is limited by chest pain. It is obvious from talking to her and daughter that dementia and memory are getting worse ( that's why she moved to [place] to be with daughter) She does not want to be DNR and daughter knows she can withdraw care if situation hopeless. 01/21/14: Nuclear study normal with no ischemia EF 76%.  Doppler with bilateral 40-59% ICA stenosis  During last office visit in Feb 2016 she was still reporting anginal sounding pain with exertion. Improved with nitro patch at night. F/U echo for AS murmur1/11/16showed mild AS and normal EF. She presents today with CP which started around 1000hrs and radiates to her arms, neck/jaw and back. She took three SL NTG and each time it eased the pain but then returned. She called EMS who  gave her more NTG. She reports a little dizziness but denies nausea, vomiting, fever, shortness of breath, orthopnea, PND, cough, congestion, abdominal pain, hematochezia, melena, lower extremity edema, claudication. She takes lasix PRN for edema and took one yesterday. No LEE today. She has been using SL NTG on a more frequent basis. She is not using the ntg patch any longer.  Cardiac catheterization performed 09/25/07. Left main: 40% ostial, LAD 20% proximal, 30% mid, 50% distal. LCX: 40% ramus. RCA 99% ostial, 30% proximal, 20% mid. PCI with drug-eluting stent x4 to RCA. 2. Echocardiogram performed 09/25/07, ejection fraction greater than 55%. Mild MR, trivial PR, mild TR. No valvular stenosis.   She was admitted on IV NTG and IV heparin.  ASA, statin and beta blocker also added.  NTG was increased due to more CP.  Troponin 3.74.  She was taken to the cath lab and coronary angiography revealed single vessel obstructive CAD with de novo and in-stent stenosis in the ostium and proximal RCA.  She underwent successful stenting of the ostial/proximal RCA with DES.  She was started on plavix.  She ambulated 353ft on room air with cardiac rehab.  Phase two CR and home health PT was arranged.  The patient was seen by Dr. Mare Ferrari who felt she was stable for DC home.   Consults: Cardiac rehab  Significant Diagnostic Studies:  Lipid Panel     Component Value Date/Time   CHOL 132 11/16/2014 0017   TRIG 56 11/16/2014 0017   HDL 49 11/16/2014 0017   CHOLHDL 2.7 11/16/2014 0017   VLDL 11 11/16/2014 0017   LDLCALC 72 11/16/2014 0017  Cardiac Catheterization Procedure Note  Name: Sara Lynch MRN: [id number] DOB: Aug 25, 1928  Procedure: Selective Coronary Angiography, PTCA and stenting of the Proximal/ostial RCA.  Indication: 81 yo WF presents with a NSTEMI and continues to have refractory chest pain despite maximal medical therapy. She is s/p stenting of the ostium of the RCA in 2009 with a  2.75 x 15 and 3.0 x 8 mm Xience stents.   Procedural Details: The right wrist was prepped, draped, and anesthetized with 1% lidocaine. Using the modified Seldinger technique, a 6 French slender sheath was introduced into the right radial artery. 3 mg of verapamil was administered through the sheath, weight-based unfractionated heparin was administered intravenously. Standard Judkins catheters were used for selective coronary angiography. Catheter exchanges were performed over an exchange length guidewire.  PROCEDURAL FINDINGS Hemodynamics: AO 123/57 mean 86 mm Hg LV N/A  Coronary angiography: Coronary dominance: right  Left mainstem: 30% ostial Left main.   Left anterior descending (LAD): The LAD is tortuous with mild calcification. There are scattered 20% lesions.  Left circumflex (LCx): Tortuous with 30% disease in the proximal and mid vessel.   Right coronary artery (RCA): The RCA is heavily calcified proximally. There is a stent in place. There is diffuse 50-70% in stent stenosis. There is a 99% stenosis at what appears to be the distal stent margin. This is followed by a 70% stenosis in the proximal vessel. The RCA is a large dominant vessel. There were left to right collaterals to the PDA.  Left ventriculography: N/A  PCI Note: Following the diagnostic procedure, the decision was made to proceed with PCI of the RCA. Weight-based bivalirudin was given for anticoagulation. Plavix 600 mg was given orally. Once a therapeutic ACT was achieved, a 6 [place] LA 0.75 guide catheter was inserted. A Choice PT MS coronary guidewire was used to cross the lesion. The lesion was difficult to cross with balloons and was predilated progressively with 2.0 mm, 2.5 mm, and 3.5 mm balloons. Despite repeated attempts we were unable to pass an IVUS catheter. By angiography it appeared the prior stents were grossly undersized. Despite good expansion with a 3.5 mm balloon we were unable to  deliver a stent. Using a Guideliner catheter over a 2.5 mm balloon we were able to advance the Guideliner past the lesion. The lesion was then stented with a 3.5 x 38 mm Synergy stent covering the entire segment of disease and extending outside the ostium. The stent was postdilated with a 3.75 mm noncompliant balloon. Following PCI, there was 0% residual stenosis and TIMI-3 flow. Final angiography confirmed an excellent result. The patient tolerated the procedure well. There were no immediate procedural complications. A TR band was used for radial hemostasis. The patient was transferred to the post catheterization recovery area for further monitoring. Contrast 120 cc.   PCI Data: Vessel - RCA/Segment - ostial/proximal Percent Stenosis (pre) 99% TIMI-flow 2 Stent 3.5 x 38 mm Synergy stent. Percent Stenosis (post) 0% TIMI-flow (post) 3  Final Conclusions:  1. Single vessel obstructive CAD with de novo and in-stent stenosis in the ostium and proximal RCA. 2. Successful stenting of the ostial/proximal RCA with DES.   Recommendations:  Continue DAPT with ASA and Plavix indefinitely. Given frail status will ambulate tomorrow and consider DC Wednesday if no complications.  Peter Martinique, [place] 11/17/2014, 4:56 PM  Treatments: See above  Discharge Exam: Blood pressure 132/55, pulse 65, temperature 98.2 F (36.8 C), temperature source Oral, resp. rate 16, height 5\' 7"  (1.702 m), weight  129 lb 6.6 oz (58.7 kg), SpO2 95 %.   Disposition: Final discharge disposition not confirmed      Discharge Instructions    Amb Referral to Cardiac Rehabilitation    Complete by:  As directed      Diet - low sodium heart healthy    Complete by:  As directed      Discharge instructions    Complete by:  As directed   No lifting with right arm for two days.     Increase activity slowly    Complete by:  As directed             Medication List    STOP taking these medications        aspirin EC  325 MG tablet  Replaced by:  aspirin 81 MG chewable tablet     losartan 25 MG tablet  Commonly known as:  COZAAR     omeprazole 20 MG capsule  Commonly known as:  PRILOSEC      TAKE these medications        acetaminophen 500 MG tablet  Commonly known as:  TYLENOL  Take 500 mg by mouth.     allopurinol 100 MG tablet  Commonly known as:  ZYLOPRIM  Take 100 mg by mouth daily.     amLODipine 2.5 MG tablet  Commonly known as:  NORVASC  TAKE 1 TABLET DAILY     aspirin 81 MG chewable tablet  Chew 1 tablet (81 mg total) by mouth daily.     carvedilol 12.5 MG tablet  Commonly known as:  COREG  TAKE 1 TABLET TWICE A DAY WITH MEALS     clopidogrel 75 MG tablet  Commonly known as:  PLAVIX  Take 1 tablet (75 mg total) by mouth daily with breakfast.     folic acid 1 MG tablet  Commonly known as:  FOLVITE  Take 1 mg by mouth daily.     furosemide 20 MG tablet  Commonly known as:  LASIX  Take 20 mg by mouth as needed.     methotrexate 2.5 MG tablet  Take by mouth. On wednesdays Take three pills a week     nitroGLYCERIN 0.4 MG SL tablet  Commonly known as:  NITROSTAT  Place 1 tablet (0.4 mg total) under the tongue every 5 (five) minutes as needed for chest pain.     pravastatin 40 MG tablet  Commonly known as:  PRAVACHOL  Take 1 tablet (40 mg total) by mouth daily.     VITAMIN D-3 PO  Take 1 capsule by mouth every other day.       Greater than 30 minutes was spent completing the patient's discharge.   Follow-up Information    Follow up with [facility].   Why:  nse and phy ther  will call to set up apt[place]   Contact information:   [address] [place] [address] [phone]       Follow up with Truitt Merle, NP On 11/26/2014.   Specialty:  Nurse Practitioner   Why:  9:30AM   Contact information:   [address]  [place] [address] [phone]        Signed: Tarri Fuller, PAC 11/19/2014, 9:59 AM

## 2014-11-19 NOTE — Progress Notes (Signed)
PT d/c instructions complete.  Pt & family member denies any further comments or questions; all pt belongings returned to pt; pt taken out to ride.

## 2014-11-20 NOTE — Telephone Encounter (Signed)
Left message to call back  

## 2014-11-20 NOTE — Telephone Encounter (Signed)
Patient contacted regarding discharge from [facility] on November 19, 2014.  Patient understands to follow up with provider Truitt Merle, NP on Nov 26, 2014 at 9:30AM at [facility]. Patient understands discharge instructions? yes Patient understands medications and regiment? yes Patient understands to bring all medications to this visit? yes  Spoke with pt's daughter Oswaldo Conroy- DPR on file.

## 2014-11-21 DIAGNOSIS — L89522 Pressure ulcer of left ankle, stage 2: Secondary | ICD-10-CM | POA: Diagnosis not present

## 2014-11-21 DIAGNOSIS — M109 Gout, unspecified: Secondary | ICD-10-CM | POA: Diagnosis not present

## 2014-11-21 DIAGNOSIS — I2510 Atherosclerotic heart disease of native coronary artery without angina pectoris: Secondary | ICD-10-CM | POA: Diagnosis not present

## 2014-11-21 DIAGNOSIS — I252 Old myocardial infarction: Secondary | ICD-10-CM | POA: Diagnosis not present

## 2014-11-21 DIAGNOSIS — I10 Essential (primary) hypertension: Secondary | ICD-10-CM | POA: Diagnosis not present

## 2014-11-21 DIAGNOSIS — S50911D Unspecified superficial injury of right forearm, subsequent encounter: Secondary | ICD-10-CM | POA: Diagnosis not present

## 2014-11-21 DIAGNOSIS — M3320 Polymyositis, organ involvement unspecified: Secondary | ICD-10-CM | POA: Diagnosis not present

## 2014-11-21 DIAGNOSIS — F0390 Unspecified dementia without behavioral disturbance: Secondary | ICD-10-CM | POA: Diagnosis not present

## 2014-11-21 DIAGNOSIS — I214 Non-ST elevation (NSTEMI) myocardial infarction: Secondary | ICD-10-CM | POA: Diagnosis not present

## 2014-11-21 DIAGNOSIS — E785 Hyperlipidemia, unspecified: Secondary | ICD-10-CM | POA: Diagnosis not present

## 2014-11-24 DIAGNOSIS — I214 Non-ST elevation (NSTEMI) myocardial infarction: Secondary | ICD-10-CM | POA: Diagnosis not present

## 2014-11-24 DIAGNOSIS — I2510 Atherosclerotic heart disease of native coronary artery without angina pectoris: Secondary | ICD-10-CM | POA: Diagnosis not present

## 2014-11-24 DIAGNOSIS — F0390 Unspecified dementia without behavioral disturbance: Secondary | ICD-10-CM | POA: Diagnosis not present

## 2014-11-24 DIAGNOSIS — M3320 Polymyositis, organ involvement unspecified: Secondary | ICD-10-CM | POA: Diagnosis not present

## 2014-11-24 DIAGNOSIS — L89522 Pressure ulcer of left ankle, stage 2: Secondary | ICD-10-CM | POA: Diagnosis not present

## 2014-11-24 DIAGNOSIS — S50911D Unspecified superficial injury of right forearm, subsequent encounter: Secondary | ICD-10-CM | POA: Diagnosis not present

## 2014-11-25 DIAGNOSIS — S50911D Unspecified superficial injury of right forearm, subsequent encounter: Secondary | ICD-10-CM | POA: Diagnosis not present

## 2014-11-25 DIAGNOSIS — I214 Non-ST elevation (NSTEMI) myocardial infarction: Secondary | ICD-10-CM | POA: Diagnosis not present

## 2014-11-25 DIAGNOSIS — L89522 Pressure ulcer of left ankle, stage 2: Secondary | ICD-10-CM | POA: Diagnosis not present

## 2014-11-25 DIAGNOSIS — M3320 Polymyositis, organ involvement unspecified: Secondary | ICD-10-CM | POA: Diagnosis not present

## 2014-11-25 DIAGNOSIS — I2510 Atherosclerotic heart disease of native coronary artery without angina pectoris: Secondary | ICD-10-CM | POA: Diagnosis not present

## 2014-11-25 DIAGNOSIS — F0390 Unspecified dementia without behavioral disturbance: Secondary | ICD-10-CM | POA: Diagnosis not present

## 2014-11-26 ENCOUNTER — Encounter: Payer: Self-pay | Admitting: Nurse Practitioner

## 2014-11-26 ENCOUNTER — Ambulatory Visit (INDEPENDENT_AMBULATORY_CARE_PROVIDER_SITE_OTHER): Payer: Medicare Other | Admitting: Nurse Practitioner

## 2014-11-26 VITALS — BP 150/60 | HR 81 | Ht 67.0 in | Wt 133.6 lb

## 2014-11-26 DIAGNOSIS — I10 Essential (primary) hypertension: Secondary | ICD-10-CM

## 2014-11-26 DIAGNOSIS — F0390 Unspecified dementia without behavioral disturbance: Secondary | ICD-10-CM | POA: Diagnosis not present

## 2014-11-26 DIAGNOSIS — I214 Non-ST elevation (NSTEMI) myocardial infarction: Secondary | ICD-10-CM | POA: Diagnosis not present

## 2014-11-26 DIAGNOSIS — Z955 Presence of coronary angioplasty implant and graft: Secondary | ICD-10-CM

## 2014-11-26 LAB — CBC
HCT: 32.1 % — ABNORMAL LOW (ref 36.0–46.0)
Hemoglobin: 10.9 g/dL — ABNORMAL LOW (ref 12.0–15.0)
MCHC: 34 g/dL (ref 30.0–36.0)
MCV: 100.9 fl — ABNORMAL HIGH (ref 78.0–100.0)
Platelets: 221 10*3/uL (ref 150.0–400.0)
RBC: 3.18 Mil/uL — ABNORMAL LOW (ref 3.87–5.11)
RDW: 15.8 % — ABNORMAL HIGH (ref 11.5–15.5)
WBC: 7.7 10*3/uL (ref 4.0–10.5)

## 2014-11-26 LAB — BASIC METABOLIC PANEL
BUN: 39 mg/dL — ABNORMAL HIGH (ref 6–23)
CO2: 26 mEq/L (ref 19–32)
Calcium: 9.6 mg/dL (ref 8.4–10.5)
Chloride: 105 mEq/L (ref 96–112)
Creatinine, Ser: 1.17 mg/dL (ref 0.40–1.20)
GFR: 46.89 mL/min — ABNORMAL LOW (ref >60.00)
Glucose, Bld: 133 mg/dL — ABNORMAL HIGH (ref 70–99)
Potassium: 4.3 mEq/L (ref 3.5–5.1)
Sodium: 138 mEq/L (ref 135–145)

## 2014-11-26 NOTE — Progress Notes (Signed)
CARDIOLOGY OFFICE NOTE  Date:  11/26/2014    [name] Date of Birth: Dec 03, 1928 Medical Record #[id number]  PCP:  Irene Pap, NP  Cardiologist:  Johnsie Cancel    Chief Complaint  Patient presents with  . Post PCI    TOC visit - seen for Dr. Johnsie Cancel     History of Present Illness: Sara Lynch is a 81 y.o. female who presents today for a post hospital/TOC visit. Seen for Dr. Johnsie Cancel. Her daughter Sara Lynch is her health care power of attorney. The patient has advancing dementia with LE weakness from polymyositis and moved to [facility] to live with her oldest daughter. She has CAD that was ill defined back in 4174 complicated by embolic shower with stroke and she was cautioned never to have another. She has had chronic chest pain. Admitted to [facility] April with MI. This occurred 2 days after starting Ranexa. Daughter thought some of her "angina" was anxiety driven. Majority of episodes at night.   Other issues include bilateral LE weakness from polymyositis and PT/rehab is limited by chest pain.   She presented last month with CP - called EMS - elevated Troponin. She was taken to the cath lab and coronary angiography revealed single vessel obstructive CAD with de novo and in-stent stenosis in the ostium and proximal RCA. She underwent successful stenting of the ostial/proximal RCA with DES. She was started on plavix.   Comes back today. Here with her daughter Sara Lynch. No more chest pain. Doing very well. No problems so far. Was at [facility] earlier this morning. Apparently likes a lot of coffee. She is quite happy with how she is doing. Tolerating her medicines. Daughter is quite concerned about Home health. Apparently to get for 9 weeks. [name] spends a week every month at her other daughter's house in [place]. She was pretty immobile prior to this cardiac event due to chest pain - this is what caused her to get weaker. Daughter not sure why OT is coming. Would like to go to  cardiac rehab in a few weeks. Going to the beach the week of June 11th.   Past Medical History  Diagnosis Date  . Heart murmur   . Hyperlipidemia   . UTI (urinary tract infection)   . Hypertension   . Myocardial infarction     times 2  . Breast cancer     81 yo: s/p radical mastectomy  . Polymyositis     Methotrexate x years (Dr. Lenna Gilford is rheum as of 2015)  . Gout     Past Surgical History  Procedure Laterality Date  . Appendectomy    . Abdominal hysterectomy    . Cataract extraction Bilateral   . Mastectomy, radical Left   . Left heart catheterization with coronary angiogram N/A 11/17/2014    Procedure: LEFT HEART CATHETERIZATION WITH CORONARY ANGIOGRAM;  Surgeon: Peter M Martinique, MD;  Location: [facility] CATH LAB;  Service: Cardiovascular;  Laterality: N/A;     Medications: Current Outpatient Prescriptions  Medication Sig Dispense Refill  . acetaminophen (TYLENOL) 500 MG tablet Take 500 mg by mouth.    Marland Kitchen allopurinol (ZYLOPRIM) 100 MG tablet Take 100 mg by mouth daily.    Marland Kitchen amLODipine (NORVASC) 2.5 MG tablet TAKE 1 TABLET DAILY 90 tablet 2  . aspirin 81 MG chewable tablet Chew 1 tablet (81 mg total) by mouth daily.    . carvedilol (COREG) 12.5 MG tablet TAKE 1 TABLET TWICE A DAY WITH MEALS 180 tablet  4  . Cholecalciferol (VITAMIN D-3 PO) Take 1 capsule by mouth every other day.     . clopidogrel (PLAVIX) 75 MG tablet Take 1 tablet (75 mg total) by mouth daily with breakfast. 30 tablet 11  . folic acid (FOLVITE) 1 MG tablet Take 1 mg by mouth daily.    . furosemide (LASIX) 20 MG tablet Take 20 mg by mouth as needed.    . methotrexate 2.5 MG tablet Take by mouth. On wednesdays Take three pills a week    . nitroGLYCERIN (NITROSTAT) 0.4 MG SL tablet Place 1 tablet (0.4 mg total) under the tongue every 5 (five) minutes as needed for chest pain. 25 tablet 3  . pantoprazole (PROTONIX) 40 MG tablet Take 1 tablet (40 mg total) by mouth daily. 30 tablet 5  . pravastatin (PRAVACHOL) 40 MG  tablet Take 1 tablet (40 mg total) by mouth daily. 90 tablet 3   No current facility-administered medications for this visit.    Allergies: Allergies  Allergen Reactions  . Enalapril Maleate Cough  . Oxycodone Nausea And Vomiting    Abdominal pain  . Codeine   . Penicillins   . Fosamax [Alendronate Sodium] Other (See Comments)    Headaches    Social History: The patient  reports that she has never smoked. She does not have any smokeless tobacco history on file. She reports that she does not drink alcohol or use illicit drugs.   Family History: The patient's family history includes Cancer in her mother.   Review of Systems: Please see the history of present illness.   Otherwise, the review of systems is positive for dementia.   All other systems are reviewed and negative.   Physical Exam: VS:  BP 150/60 mmHg  Pulse 81  Ht 5\' 7"  (1.702 m)  Wt 133 lb 9.6 oz (60.601 kg)  BMI 20.92 kg/m2  SpO2 100% .  BMI Body mass index is 20.92 kg/(m^2).   Repeat BP by me is 130/70.  Wt Readings from Last 3 Encounters:  11/26/14 133 lb 9.6 oz (60.601 kg)  11/15/14 129 lb 6.6 oz (58.7 kg)  10/01/14 133 lb (60.328 kg)    General: Pleasant. Well developed, well nourished and in no acute distress.She is thin.  HEENT: Normal. Neck: Supple, no JVD, carotid bruits, or masses noted.  Cardiac: Regular rate and rhythm. Soft outflow murmur. No edema.  Respiratory:  Lungs are clear to auscultation bilaterally with normal work of breathing.  GI: Soft and nontender.  MS: No deformity or atrophy. Gait and ROM intact. Some lower extremity weakness noted. Using a cane. She is able to walk.  Skin: Warm and dry. Color is normal.  Neuro:  Strength and sensation are intact and no gross focal deficits noted.  Psych: Alert, appropriate and with normal affect.   LABORATORY DATA:  EKG:  EKG is not ordered today.  Lab Results  Component Value Date   WBC 6.1 11/18/2014   HGB 10.2* 11/18/2014   HCT  30.1* 11/18/2014   PLT 184 11/18/2014   GLUCOSE 136* 11/18/2014   CHOL 132 11/16/2014   TRIG 56 11/16/2014   HDL 49 11/16/2014   LDLCALC 72 11/16/2014   ALT 42 11/03/2014   AST 27 11/03/2014   NA 138 11/18/2014   K 4.0 11/18/2014   CL 108 11/18/2014   CREATININE 1.00 11/18/2014   BUN 23 11/18/2014   CO2 21 11/18/2014   TSH 3.665 11/15/2014   INR 1.14 11/16/2014   HGBA1C 6.3*  11/15/2014    BNP (last 3 results) No results for input(s): BNP in the last 8760 hours.  ProBNP (last 3 results) No results for input(s): PROBNP in the last 8760 hours.   Other Studies Reviewed Today:  Cardiac Catheterization Procedure Note Coronary angiography: Coronary dominance: right  Left mainstem: 30% ostial Left main.   Left anterior descending (LAD): The LAD is tortuous with mild calcification. There are scattered 20% lesions.  Left circumflex (LCx): Tortuous with 30% disease in the proximal and mid vessel.   Right coronary artery (RCA): The RCA is heavily calcified proximally. There is a stent in place. There is diffuse 50-70% in stent stenosis. There is a 99% stenosis at what appears to be the distal stent margin. This is followed by a 70% stenosis in the proximal vessel. The RCA is a large dominant vessel. There were left to right collaterals to the PDA.  Left ventriculography: N/A  PCI Note: Following the diagnostic procedure, the decision was made to proceed with PCI of the RCA. Weight-based bivalirudin was given for anticoagulation. Plavix 600 mg was given orally. Once a therapeutic ACT was achieved, a 6 [place] LA 0.75 guide catheter was inserted. A Choice PT MS coronary guidewire was used to cross the lesion. The lesion was difficult to cross with balloons and was predilated progressively with 2.0 mm, 2.5 mm, and 3.5 mm balloons. Despite repeated attempts we were unable to pass an IVUS catheter. By angiography it appeared the prior stents were grossly undersized. Despite good  expansion with a 3.5 mm balloon we were unable to deliver a stent. Using a Guideliner catheter over a 2.5 mm balloon we were able to advance the Guideliner past the lesion. The lesion was then stented with a 3.5 x 38 mm Synergy stent covering the entire segment of disease and extending outside the ostium. The stent was postdilated with a 3.75 mm noncompliant balloon. Following PCI, there was 0% residual stenosis and TIMI-3 flow. Final angiography confirmed an excellent result. The patient tolerated the procedure well. There were no immediate procedural complications. A TR band was used for radial hemostasis. The patient was transferred to the post catheterization recovery area for further monitoring. Contrast 120 cc.   PCI Data: Vessel - RCA/Segment - ostial/proximal Percent Stenosis (pre) 99% TIMI-flow 2 Stent 3.5 x 38 mm Synergy stent. Percent Stenosis (post) 0% TIMI-flow (post) 3  Final Conclusions:  1. Single vessel obstructive CAD with de novo and in-stent stenosis in the ostium and proximal RCA. 2. Successful stenting of the ostial/proximal RCA with DES.   Recommendations:  Continue DAPT with ASA and Plavix indefinitely. Given frail status will ambulate tomorrow and consider DC Wednesday if no complications.  Peter Martinique, [place] 11/17/2014, 4:56 PM    Echo Study Conclusions 07/2014  - Left ventricle: The cavity size was normal. There was mild focal basal hypertrophy of the septum. Systolic function was normal. The estimated ejection fraction was in the range of 55% to 60%. - Aortic valve: There was mild stenosis. There was mild regurgitation. - Mitral valve: There was mild regurgitation. - Left atrium: The atrium was mildly dilated. - Atrial septum: No defect or patent foramen ovale was identified.  Assessment/Plan: 1. NSTEMI - with PCI to the RCA - doing well. No more chest pain. Continue DAPT indefinitely.  2. HTN - recheck by me is ok  3. Polymyositis -  most likely why getting PT/OT - probably does not need for 9 weeks - patient will NOT be home bound.  Will send to cardiac rehab when therapy is complete.   4. HLD - on statin - recheck fasting labs on return  5. Dementia -  Daughter asking about when patient can be left alone - again - this looks to be more from a mobility/safety issue rather than her heart or even her dementia. I suggested looking into a Life life.   Current medicines are reviewed with the patient today.  The patient does not have concerns regarding medicines other than what has been noted above.  The following changes have been made:  See above.  Labs/ tests ordered today include:    Orders Placed This Encounter  Procedures  . Basic metabolic panel  . CBC     Disposition:   FU with Dr. Johnsie Cancel in 6 weeks with fasting labs.   Patient is agreeable to this plan and will call if any problems develop in the interim.   Signed: Burtis Junes, RN, ANP-C 11/26/2014 10:06 AM  [facility] [address] [place], [place]  [address] Phone: [phone] Fax: [phone]

## 2014-11-26 NOTE — Patient Instructions (Addendum)
We will be checking the following labs today - BMET & CBC   Medication Instructions:    Continue with your current medicines.     Testing/Procedures To Be Arranged:  N/A  Follow-Up:   See Dr. Johnsie Cancel in 6 weeks with fasting labs   Other Special Instructions:   Consider getting a Life Line  Call the [place] office at [phone] if you have any questions, problems or concerns.

## 2014-11-27 DIAGNOSIS — F0390 Unspecified dementia without behavioral disturbance: Secondary | ICD-10-CM | POA: Diagnosis not present

## 2014-11-27 DIAGNOSIS — I2510 Atherosclerotic heart disease of native coronary artery without angina pectoris: Secondary | ICD-10-CM | POA: Diagnosis not present

## 2014-11-27 DIAGNOSIS — I214 Non-ST elevation (NSTEMI) myocardial infarction: Secondary | ICD-10-CM | POA: Diagnosis not present

## 2014-11-27 DIAGNOSIS — L89522 Pressure ulcer of left ankle, stage 2: Secondary | ICD-10-CM | POA: Diagnosis not present

## 2014-11-27 DIAGNOSIS — M3320 Polymyositis, organ involvement unspecified: Secondary | ICD-10-CM | POA: Diagnosis not present

## 2014-11-27 DIAGNOSIS — S50911D Unspecified superficial injury of right forearm, subsequent encounter: Secondary | ICD-10-CM | POA: Diagnosis not present

## 2014-12-11 DIAGNOSIS — I214 Non-ST elevation (NSTEMI) myocardial infarction: Secondary | ICD-10-CM | POA: Diagnosis not present

## 2014-12-11 DIAGNOSIS — L89522 Pressure ulcer of left ankle, stage 2: Secondary | ICD-10-CM | POA: Diagnosis not present

## 2014-12-11 DIAGNOSIS — I2510 Atherosclerotic heart disease of native coronary artery without angina pectoris: Secondary | ICD-10-CM | POA: Diagnosis not present

## 2014-12-11 DIAGNOSIS — S50911D Unspecified superficial injury of right forearm, subsequent encounter: Secondary | ICD-10-CM | POA: Diagnosis not present

## 2014-12-11 DIAGNOSIS — M3320 Polymyositis, organ involvement unspecified: Secondary | ICD-10-CM | POA: Diagnosis not present

## 2014-12-11 DIAGNOSIS — F0390 Unspecified dementia without behavioral disturbance: Secondary | ICD-10-CM | POA: Diagnosis not present

## 2014-12-12 DIAGNOSIS — I214 Non-ST elevation (NSTEMI) myocardial infarction: Secondary | ICD-10-CM | POA: Diagnosis not present

## 2014-12-12 DIAGNOSIS — L89522 Pressure ulcer of left ankle, stage 2: Secondary | ICD-10-CM | POA: Diagnosis not present

## 2014-12-12 DIAGNOSIS — M3320 Polymyositis, organ involvement unspecified: Secondary | ICD-10-CM | POA: Diagnosis not present

## 2014-12-12 DIAGNOSIS — I2510 Atherosclerotic heart disease of native coronary artery without angina pectoris: Secondary | ICD-10-CM | POA: Diagnosis not present

## 2014-12-12 DIAGNOSIS — S50911D Unspecified superficial injury of right forearm, subsequent encounter: Secondary | ICD-10-CM | POA: Diagnosis not present

## 2014-12-12 DIAGNOSIS — F0390 Unspecified dementia without behavioral disturbance: Secondary | ICD-10-CM | POA: Diagnosis not present

## 2014-12-13 ENCOUNTER — Emergency Department (HOSPITAL_COMMUNITY): Payer: Medicare Other

## 2014-12-13 ENCOUNTER — Inpatient Hospital Stay (HOSPITAL_COMMUNITY)
Admission: EM | Admit: 2014-12-13 | Discharge: 2014-12-18 | DRG: 492 | Disposition: A | Discharge status: Rehabilitation Facility | Payer: Medicare Other | Attending: General Surgery | Admitting: General Surgery

## 2014-12-13 ENCOUNTER — Encounter (HOSPITAL_COMMUNITY): Payer: Self-pay | Admitting: Emergency Medicine

## 2014-12-13 ENCOUNTER — Other Ambulatory Visit (HOSPITAL_COMMUNITY): Payer: Self-pay

## 2014-12-13 DIAGNOSIS — S066X0D Traumatic subarachnoid hemorrhage without loss of consciousness, subsequent encounter: Secondary | ICD-10-CM | POA: Diagnosis not present

## 2014-12-13 DIAGNOSIS — S129XXA Fracture of neck, unspecified, initial encounter: Secondary | ICD-10-CM | POA: Diagnosis not present

## 2014-12-13 DIAGNOSIS — S42202A Unspecified fracture of upper end of left humerus, initial encounter for closed fracture: Secondary | ICD-10-CM | POA: Diagnosis not present

## 2014-12-13 DIAGNOSIS — S199XXA Unspecified injury of neck, initial encounter: Secondary | ICD-10-CM | POA: Diagnosis not present

## 2014-12-13 DIAGNOSIS — S42201A Unspecified fracture of upper end of right humerus, initial encounter for closed fracture: Secondary | ICD-10-CM | POA: Diagnosis not present

## 2014-12-13 DIAGNOSIS — D62 Acute posthemorrhagic anemia: Secondary | ICD-10-CM | POA: Diagnosis not present

## 2014-12-13 DIAGNOSIS — R936 Abnormal findings on diagnostic imaging of limbs: Secondary | ICD-10-CM | POA: Diagnosis not present

## 2014-12-13 DIAGNOSIS — S0285XA Fracture of orbit, unspecified, initial encounter for closed fracture: Secondary | ICD-10-CM | POA: Diagnosis present

## 2014-12-13 DIAGNOSIS — S06360A Traumatic hemorrhage of cerebrum, unspecified, without loss of consciousness, initial encounter: Secondary | ICD-10-CM | POA: Diagnosis not present

## 2014-12-13 DIAGNOSIS — T148XXA Other injury of unspecified body region, initial encounter: Secondary | ICD-10-CM

## 2014-12-13 DIAGNOSIS — S0083XA Contusion of other part of head, initial encounter: Secondary | ICD-10-CM | POA: Diagnosis not present

## 2014-12-13 DIAGNOSIS — Z880 Allergy status to penicillin: Secondary | ICD-10-CM | POA: Diagnosis not present

## 2014-12-13 DIAGNOSIS — Z419 Encounter for procedure for purposes other than remedying health state, unspecified: Secondary | ICD-10-CM

## 2014-12-13 DIAGNOSIS — F0390 Unspecified dementia without behavioral disturbance: Secondary | ICD-10-CM | POA: Diagnosis present

## 2014-12-13 DIAGNOSIS — Z888 Allergy status to other drugs, medicaments and biological substances status: Secondary | ICD-10-CM

## 2014-12-13 DIAGNOSIS — S42352A Displaced comminuted fracture of shaft of humerus, left arm, initial encounter for closed fracture: Secondary | ICD-10-CM | POA: Diagnosis not present

## 2014-12-13 DIAGNOSIS — S12500A Unspecified displaced fracture of sixth cervical vertebra, initial encounter for closed fracture: Secondary | ICD-10-CM | POA: Diagnosis present

## 2014-12-13 DIAGNOSIS — S069X0S Unspecified intracranial injury without loss of consciousness, sequela: Secondary | ICD-10-CM | POA: Diagnosis not present

## 2014-12-13 DIAGNOSIS — Z955 Presence of coronary angioplasty implant and graft: Secondary | ICD-10-CM | POA: Diagnosis not present

## 2014-12-13 DIAGNOSIS — M3320 Polymyositis, organ involvement unspecified: Secondary | ICD-10-CM | POA: Diagnosis present

## 2014-12-13 DIAGNOSIS — S0093XA Contusion of unspecified part of head, initial encounter: Secondary | ICD-10-CM | POA: Diagnosis not present

## 2014-12-13 DIAGNOSIS — E785 Hyperlipidemia, unspecified: Secondary | ICD-10-CM | POA: Diagnosis not present

## 2014-12-13 DIAGNOSIS — I252 Old myocardial infarction: Secondary | ICD-10-CM

## 2014-12-13 DIAGNOSIS — S299XXA Unspecified injury of thorax, initial encounter: Secondary | ICD-10-CM | POA: Diagnosis not present

## 2014-12-13 DIAGNOSIS — S0181XA Laceration without foreign body of other part of head, initial encounter: Secondary | ICD-10-CM | POA: Diagnosis present

## 2014-12-13 DIAGNOSIS — I2510 Atherosclerotic heart disease of native coronary artery without angina pectoris: Secondary | ICD-10-CM | POA: Diagnosis not present

## 2014-12-13 DIAGNOSIS — S066X0A Traumatic subarachnoid hemorrhage without loss of consciousness, initial encounter: Secondary | ICD-10-CM | POA: Diagnosis not present

## 2014-12-13 DIAGNOSIS — I609 Nontraumatic subarachnoid hemorrhage, unspecified: Secondary | ICD-10-CM | POA: Diagnosis not present

## 2014-12-13 DIAGNOSIS — S42302A Unspecified fracture of shaft of humerus, left arm, initial encounter for closed fracture: Secondary | ICD-10-CM

## 2014-12-13 DIAGNOSIS — S066XAA Traumatic subarachnoid hemorrhage with loss of consciousness status unknown, initial encounter: Secondary | ICD-10-CM

## 2014-12-13 DIAGNOSIS — Z853 Personal history of malignant neoplasm of breast: Secondary | ICD-10-CM

## 2014-12-13 DIAGNOSIS — S066X1S Traumatic subarachnoid hemorrhage with loss of consciousness of 30 minutes or less, sequela: Secondary | ICD-10-CM | POA: Diagnosis not present

## 2014-12-13 DIAGNOSIS — S42212D Unspecified displaced fracture of surgical neck of left humerus, subsequent encounter for fracture with routine healing: Secondary | ICD-10-CM | POA: Diagnosis not present

## 2014-12-13 DIAGNOSIS — R220 Localized swelling, mass and lump, head: Secondary | ICD-10-CM

## 2014-12-13 DIAGNOSIS — S42402A Unspecified fracture of lower end of left humerus, initial encounter for closed fracture: Secondary | ICD-10-CM | POA: Diagnosis not present

## 2014-12-13 DIAGNOSIS — M7989 Other specified soft tissue disorders: Secondary | ICD-10-CM | POA: Diagnosis not present

## 2014-12-13 DIAGNOSIS — S3993XA Unspecified injury of pelvis, initial encounter: Secondary | ICD-10-CM | POA: Diagnosis not present

## 2014-12-13 DIAGNOSIS — R739 Hyperglycemia, unspecified: Secondary | ICD-10-CM | POA: Diagnosis not present

## 2014-12-13 DIAGNOSIS — S42212A Unspecified displaced fracture of surgical neck of left humerus, initial encounter for closed fracture: Secondary | ICD-10-CM | POA: Diagnosis not present

## 2014-12-13 DIAGNOSIS — S3991XA Unspecified injury of abdomen, initial encounter: Secondary | ICD-10-CM | POA: Diagnosis not present

## 2014-12-13 DIAGNOSIS — S066X9A Traumatic subarachnoid hemorrhage with loss of consciousness of unspecified duration, initial encounter: Secondary | ICD-10-CM

## 2014-12-13 DIAGNOSIS — S069X1S Unspecified intracranial injury with loss of consciousness of 30 minutes or less, sequela: Secondary | ICD-10-CM | POA: Diagnosis not present

## 2014-12-13 DIAGNOSIS — H1130 Conjunctival hemorrhage, unspecified eye: Secondary | ICD-10-CM | POA: Diagnosis present

## 2014-12-13 DIAGNOSIS — Z01810 Encounter for preprocedural cardiovascular examination: Secondary | ICD-10-CM | POA: Diagnosis not present

## 2014-12-13 DIAGNOSIS — S42202B Unspecified fracture of upper end of left humerus, initial encounter for open fracture: Secondary | ICD-10-CM | POA: Diagnosis not present

## 2014-12-13 DIAGNOSIS — S066X0S Traumatic subarachnoid hemorrhage without loss of consciousness, sequela: Secondary | ICD-10-CM | POA: Diagnosis not present

## 2014-12-13 DIAGNOSIS — S065X0A Traumatic subdural hemorrhage without loss of consciousness, initial encounter: Secondary | ICD-10-CM | POA: Diagnosis not present

## 2014-12-13 DIAGNOSIS — R918 Other nonspecific abnormal finding of lung field: Secondary | ICD-10-CM | POA: Diagnosis not present

## 2014-12-13 DIAGNOSIS — Z885 Allergy status to narcotic agent status: Secondary | ICD-10-CM | POA: Diagnosis not present

## 2014-12-13 DIAGNOSIS — S42302S Unspecified fracture of shaft of humerus, left arm, sequela: Secondary | ICD-10-CM | POA: Diagnosis not present

## 2014-12-13 DIAGNOSIS — Z809 Family history of malignant neoplasm, unspecified: Secondary | ICD-10-CM | POA: Diagnosis not present

## 2014-12-13 DIAGNOSIS — S42242A 4-part fracture of surgical neck of left humerus, initial encounter for closed fracture: Secondary | ICD-10-CM | POA: Diagnosis not present

## 2014-12-13 DIAGNOSIS — S023XXA Fracture of orbital floor, initial encounter for closed fracture: Secondary | ICD-10-CM | POA: Diagnosis present

## 2014-12-13 LAB — CBC
HEMATOCRIT: 30.3 % — AB (ref 36.0–46.0)
HEMOGLOBIN: 10.3 g/dL — AB (ref 12.0–15.0)
MCH: 33.6 pg (ref 26.0–34.0)
MCHC: 34 g/dL (ref 30.0–36.0)
MCV: 98.7 fL (ref 78.0–100.0)
PLATELETS: 220 10*3/uL (ref 150–400)
RBC: 3.07 MIL/uL — ABNORMAL LOW (ref 3.87–5.11)
RDW: 14.6 % (ref 11.5–15.5)
WBC: 13.9 10*3/uL — ABNORMAL HIGH (ref 4.0–10.5)

## 2014-12-13 LAB — PROTIME-INR
INR: 1.18 (ref 0.00–1.49)
Prothrombin Time: 15.1 seconds (ref 11.6–15.2)

## 2014-12-13 LAB — COMPREHENSIVE METABOLIC PANEL
ALBUMIN: 3.6 g/dL (ref 3.5–5.0)
ALT: 28 U/L (ref 14–54)
AST: 34 U/L (ref 15–41)
Alkaline Phosphatase: 71 U/L (ref 38–126)
Anion gap: 12 (ref 5–15)
BUN: 35 mg/dL — ABNORMAL HIGH (ref 6–20)
CHLORIDE: 105 mmol/L (ref 101–111)
CO2: 21 mmol/L — ABNORMAL LOW (ref 22–32)
Calcium: 9.3 mg/dL (ref 8.9–10.3)
Creatinine, Ser: 1.25 mg/dL — ABNORMAL HIGH (ref 0.44–1.00)
GFR calc Af Amer: 45 mL/min — ABNORMAL LOW (ref >60)
GFR, EST NON AFRICAN AMERICAN: 39 mL/min — AB (ref >60)
Glucose, Bld: 153 mg/dL — ABNORMAL HIGH (ref 65–99)
POTASSIUM: 4.1 mmol/L (ref 3.5–5.1)
SODIUM: 138 mmol/L (ref 135–145)
Total Bilirubin: 1.1 mg/dL (ref 0.3–1.2)
Total Protein: 6 g/dL — ABNORMAL LOW (ref 6.5–8.1)

## 2014-12-13 LAB — SAMPLE TO BLOOD BANK

## 2014-12-13 LAB — ETHANOL: Alcohol, Ethyl (B): <5 mg/dL (ref <5)

## 2014-12-13 MED ORDER — BISACODYL 10 MG RE SUPP: 10.0000 mg | Freq: Every day | RECTAL | Status: DC | PRN

## 2014-12-13 MED ORDER — NITROGLYCERIN 0.4 MG SL SUBL: 0.4000 mg | SUBLINGUAL_TABLET | SUBLINGUAL | Status: DC | PRN

## 2014-12-13 MED ORDER — DOCUSATE SODIUM 100 MG PO CAPS
100.0000 mg | ORAL_CAPSULE | Freq: Two times a day (BID) | ORAL | Status: DC
  Administered 2014-12-14 (×2): 100 mg via ORAL
  Filled 2014-12-13 (×4): qty 1

## 2014-12-13 MED ORDER — AMLODIPINE BESYLATE 2.5 MG PO TABS
2.5000 mg | ORAL_TABLET | Freq: Every day | ORAL | Status: DC
  Administered 2014-12-14 – 2014-12-18 (×4): 2.5 mg via ORAL
  Filled 2014-12-13 (×5): qty 1

## 2014-12-13 MED ORDER — FUROSEMIDE 20 MG PO TABS
20.0000 mg | ORAL_TABLET | Freq: Every day | ORAL | Status: DC | PRN
Start: 2014-12-13 — End: 2014-12-18
  Filled 2014-12-13: qty 1

## 2014-12-13 MED ORDER — LIDOCAINE HCL (PF) 1 % IJ SOLN
INTRAMUSCULAR | Status: AC
  Filled 2014-12-13: qty 5

## 2014-12-13 MED ORDER — MORPHINE SULFATE 2 MG/ML IJ SOLN
1.0000 mg | INTRAMUSCULAR | Status: DC | PRN
  Administered 2014-12-14 – 2014-12-15 (×6): 1 mg via INTRAVENOUS
  Administered 2014-12-16: 2 mg via INTRAVENOUS
  Administered 2014-12-16: 1 mg via INTRAVENOUS
  Administered 2014-12-16 – 2014-12-17 (×2): 2 mg via INTRAVENOUS
  Administered 2014-12-17: 1 mg via INTRAVENOUS
  Filled 2014-12-13 (×13): qty 1

## 2014-12-13 MED ORDER — FENTANYL CITRATE (PF) 100 MCG/2ML IJ SOLN
INTRAMUSCULAR | Status: AC
  Filled 2014-12-13: qty 2

## 2014-12-13 MED ORDER — ONDANSETRON HCL 4 MG/2ML IJ SOLN
INTRAMUSCULAR | Status: AC
  Filled 2014-12-13: qty 2

## 2014-12-13 MED ORDER — ONDANSETRON HCL 4 MG PO TABS: 4.0000 mg | ORAL_TABLET | Freq: Four times a day (QID) | ORAL | Status: DC | PRN

## 2014-12-13 MED ORDER — ACETAMINOPHEN 500 MG PO TABS
500.0000 mg | ORAL_TABLET | Freq: Four times a day (QID) | ORAL | Status: DC | PRN
  Administered 2014-12-14: 500 mg via ORAL
  Filled 2014-12-13: qty 1

## 2014-12-13 MED ORDER — CARVEDILOL 12.5 MG PO TABS
12.5000 mg | ORAL_TABLET | Freq: Two times a day (BID) | ORAL | Status: DC
  Administered 2014-12-14 – 2014-12-18 (×7): 12.5 mg via ORAL
  Filled 2014-12-13 (×11): qty 1

## 2014-12-13 MED ORDER — PANTOPRAZOLE SODIUM 40 MG IV SOLR
40.0000 mg | Freq: Every day | INTRAVENOUS | Status: DC
  Filled 2014-12-13 (×2): qty 40

## 2014-12-13 MED ORDER — IOHEXOL 300 MG/ML  SOLN
100.0000 mL | Freq: Once | INTRAMUSCULAR | Status: AC | PRN
  Administered 2014-12-13: 100 mL via INTRAVENOUS

## 2014-12-13 MED ORDER — ONDANSETRON HCL 4 MG/2ML IJ SOLN
4.0000 mg | Freq: Once | INTRAMUSCULAR | Status: AC
  Administered 2014-12-13: 4 mg via INTRAVENOUS
  Filled 2014-12-13: qty 2

## 2014-12-13 MED ORDER — FENTANYL CITRATE (PF) 100 MCG/2ML IJ SOLN
50.0000 ug | Freq: Once | INTRAMUSCULAR | Status: AC
  Administered 2014-12-13: 50 ug via INTRAVENOUS
  Filled 2014-12-13: qty 2

## 2014-12-13 MED ORDER — FENTANYL CITRATE (PF) 100 MCG/2ML IJ SOLN
50.0000 ug | Freq: Once | INTRAMUSCULAR | Status: AC
  Administered 2014-12-13: 50 ug via INTRAVENOUS

## 2014-12-13 MED ORDER — TETRACAINE HCL 0.5 % OP SOLN
1.0000 [drp] | Freq: Once | OPHTHALMIC | Status: AC
  Administered 2014-12-13: 2 [drp] via OPHTHALMIC

## 2014-12-13 MED ORDER — TETANUS-DIPHTH-ACELL PERTUSSIS 5-2.5-18.5 LF-MCG/0.5 IM SUSP
0.5000 mL | Freq: Once | INTRAMUSCULAR | Status: AC
  Administered 2014-12-13: 0.5 mL via INTRAMUSCULAR
  Filled 2014-12-13: qty 0.5

## 2014-12-13 MED ORDER — SODIUM CHLORIDE 0.9 % IV SOLN
INTRAVENOUS | Status: DC
  Administered 2014-12-14: 75 mL/h via INTRAVENOUS

## 2014-12-13 MED ORDER — FENTANYL CITRATE (PF) 100 MCG/2ML IJ SOLN
INTRAMUSCULAR | Status: AC
  Administered 2014-12-13: 50 ug via INTRAVENOUS
  Filled 2014-12-13: qty 2

## 2014-12-13 MED ORDER — ONDANSETRON HCL 4 MG/2ML IJ SOLN
4.0000 mg | Freq: Four times a day (QID) | INTRAMUSCULAR | Status: DC | PRN
  Filled 2014-12-13: qty 2

## 2014-12-13 MED ORDER — SODIUM CHLORIDE 0.9 % IV BOLUS (SEPSIS)
1000.0000 mL | Freq: Once | INTRAVENOUS | Status: AC
  Administered 2014-12-13: 1000 mL via INTRAVENOUS

## 2014-12-13 MED ORDER — METHOTREXATE 2.5 MG PO TABS
7.5000 mg | ORAL_TABLET | ORAL | Status: DC
  Filled 2014-12-13: qty 3

## 2014-12-13 MED ORDER — TETRACAINE HCL 0.5 % OP SOLN
1.0000 [drp] | Freq: Once | OPHTHALMIC | Status: DC
  Filled 2014-12-13: qty 2

## 2014-12-13 MED ORDER — ALLOPURINOL 100 MG PO TABS
100.0000 mg | ORAL_TABLET | Freq: Every day | ORAL | Status: DC
  Administered 2014-12-14 – 2014-12-18 (×4): 100 mg via ORAL
  Filled 2014-12-13 (×5): qty 1

## 2014-12-13 MED ORDER — PANTOPRAZOLE SODIUM 40 MG PO TBEC
40.0000 mg | DELAYED_RELEASE_TABLET | Freq: Every day | ORAL | Status: DC
  Administered 2014-12-14 – 2014-12-18 (×4): 40 mg via ORAL
  Filled 2014-12-13: qty 1

## 2014-12-13 MED ORDER — PANTOPRAZOLE SODIUM 40 MG PO TBEC
40.0000 mg | DELAYED_RELEASE_TABLET | Freq: Every day | ORAL | Status: DC
  Filled 2014-12-13 (×3): qty 1

## 2014-12-13 MED ORDER — ONDANSETRON HCL 4 MG/2ML IJ SOLN
4.0000 mg | Freq: Once | INTRAMUSCULAR | Status: AC
  Administered 2014-12-13: 4 mg via INTRAVENOUS

## 2014-12-13 NOTE — ED Notes (Signed)
Patient transported to X-ray 

## 2014-12-13 NOTE — ED Notes (Signed)
Vital signs stable. 

## 2014-12-13 NOTE — ED Notes (Signed)
Labs resulted, IV in place, meds given, patient ready for CT.  Ruby Cola from radiology informed.
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
## 2014-12-13 NOTE — ED Notes (Signed)
MD at bedside placing stitches below pt;s left eye

## 2014-12-13 NOTE — ED Notes (Addendum)
Pt returned from CT, monitor reapplied
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
## 2014-12-13 NOTE — H&P (Signed)
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 tomorrow. Keep [facility] elevated Orthopedic surgeon notified and will see patient in the AM.  Sling for shoulder only for now. Neurosurgeon notified, will see in the AM--Collar foir now and hold antiplatelet medications for now.    Sara Lynch 12/13/2014, 11:33 PM   Procedures

## 2014-12-13 NOTE — ED Notes (Addendum)
Pt transported to CT ?

## 2014-12-13 NOTE — ED Provider Notes (Signed)
D/w Dr. Hulen Skains patient case - will see patient in ED.   D/w Dr. Annette Stable with Neurosurgery - recommends cervical collar, will see in consult in am.    Quintella Reichert, MD 12/13/14 [id number]

## 2014-12-13 NOTE — ED Notes (Signed)
Family at beside. Family given emotional support and updated on plan of care
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
## 2014-12-14 ENCOUNTER — Inpatient Hospital Stay (HOSPITAL_COMMUNITY): Payer: Medicare Other

## 2014-12-14 LAB — CBC
HCT: 22.7 % — ABNORMAL LOW (ref 36.0–46.0)
HEMATOCRIT: 20 % — AB (ref 36.0–46.0)
Hemoglobin: 6.8 g/dL — CL (ref 12.0–15.0)
Hemoglobin: 7.9 g/dL — ABNORMAL LOW (ref 12.0–15.0)
MCH: 34 pg (ref 26.0–34.0)
MCH: 33.2 pg (ref 26.0–34.0)
MCHC: 34 g/dL (ref 30.0–36.0)
MCHC: 34.8 g/dL (ref 30.0–36.0)
MCV: 100 fL (ref 78.0–100.0)
MCV: 95.4 fL (ref 78.0–100.0)
PLATELETS: 159 10*3/uL (ref 150–400)
Platelets: 163 10*3/uL (ref 150–400)
RBC: 2 MIL/uL — ABNORMAL LOW (ref 3.87–5.11)
RBC: 2.38 MIL/uL — AB (ref 3.87–5.11)
RDW: 15 % (ref 11.5–15.5)
RDW: 15.9 % — ABNORMAL HIGH (ref 11.5–15.5)
WBC: 9.6 10*3/uL (ref 4.0–10.5)
WBC: 7 10*3/uL (ref 4.0–10.5)

## 2014-12-14 LAB — BASIC METABOLIC PANEL
Anion gap: 9 (ref 5–15)
BUN: 34 mg/dL — ABNORMAL HIGH (ref 6–20)
CO2: 22 mmol/L (ref 22–32)
Calcium: 8 mg/dL — ABNORMAL LOW (ref 8.9–10.3)
Chloride: 110 mmol/L (ref 101–111)
Creatinine, Ser: 1.24 mg/dL — ABNORMAL HIGH (ref 0.44–1.00)
GFR calc non Af Amer: 39 mL/min — ABNORMAL LOW (ref >60)
GFR, EST AFRICAN AMERICAN: 45 mL/min — AB (ref >60)
Glucose, Bld: 134 mg/dL — ABNORMAL HIGH (ref 65–99)
Potassium: 3.9 mmol/L (ref 3.5–5.1)
Sodium: 141 mmol/L (ref 135–145)

## 2014-12-14 LAB — PREPARE RBC (CROSSMATCH)

## 2014-12-14 LAB — ABO/RH: ABO/RH(D): B POS

## 2014-12-14 MED ORDER — SODIUM CHLORIDE 0.9 % IV SOLN
Freq: Once | INTRAVENOUS | Status: AC
  Administered 2014-12-14: 10 mL/h via INTRAVENOUS

## 2014-12-14 MED ORDER — CLOPIDOGREL BISULFATE 75 MG PO TABS
75.0000 mg | ORAL_TABLET | Freq: Every day | ORAL | Status: DC
  Administered 2014-12-14 – 2014-12-18 (×4): 75 mg via ORAL
  Filled 2014-12-14 (×5): qty 1

## 2014-12-14 NOTE — Progress Notes (Signed)
Had many conversations with the family today, including those with others with other members of the care team involved. Made more phone calls at the family's request to clarify things and ask additional questions.   The patient and the family are insistent on finding a way to have surgery on her Left shoulder as soon as possible and have been advised by everyone involved that risks of going off of the Plavix could be detrimental to her health. The patient and family feel like she will have a significant decrease of quality of life without the surgery due to her polymyositis and needing her arm/shoulder strength to be able to be mobile and active.   Pt and family wanted me to share with the care team that they would really like to have a cardiologist consulted and come speak to them to weigh out when it would be safest for the surgery to happen.  Joedy Eickhoff GARNER
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
## 2014-12-14 NOTE — Progress Notes (Signed)
As per Dr. Marchelle Folks note.  Stable SAH with min risk for rebleed.  Will restart plavix today. Hgb stable after transfusion.  Will check CBC in AM.
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
## 2014-12-14 NOTE — Progress Notes (Addendum)
Follow-up head CT scan stable. No new areas of hemorrhage/contusion.  Status post minimal traumatic subarachnoid hemorrhage. Patient with significant need for antiplatelet therapy secondary to recent coronary stenting. With this in mind I think the risk of maintaining her on her current antiplatelet regimen is minimal and her Plavix may be restarted.
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
## 2014-12-14 NOTE — Consult Note (Signed)
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 region, deviating the trachea to the right. This is presumably hematoma.  Probable nondisplaced fracture through the floor of the left orbit.  C6 vertebral body fracture as described above. Fracture at the base of the C6 spinous process.  Critical Value/emergent results were called by telephone at the time of interpretation on 12/13/2014 at 10:04 pm to Dr. Ralene Bathe , who verbally acknowledged these results.   Electronically Signed   By: Rolm Baptise M.D.   On: 12/13/2014 22:07    Pertinent items are noted in HPI. Blood pressure 123/48, pulse 99, temperature 99.4 F (37.4 C), temperature source Oral, resp. rate  13, height _0  (1.702 m), weight 62.2 kg (137 lb 2 oz), SpO2 100 %. The patient is awake and alert. She is oriented and appropriate. Her cranial nerve function is intact. Motor examination is limited secondary to her left shoulder injury however motor examination appears to be intact bilaterally. Sensory examination is nonfocal. Lower extremity reflexes are normal. Examination head ears eyes and throat demonstrates no evidence of obvious bony abnormalities. There is a large soft tissue hematoma involving her left lower face and jaw and neck. Her airway is somewhat deviated left or  right. She appears to be in no respiratory distress. There is no stridor. She has clear external auditory canals. Oropharynx nasopharynx is unremarkable. Exam is for cervical spine demonstrates some moderate tenderness but no overt bony abnormality.  Assessment/Plan: The patient has evidence of a small amount of traumatic subarachnoid hemorrhage with possible evolving cortical contusions. I agree with getting a follow-up head CT scan. If the head CT scan is stable that I would be fine with the patient continuing on Plavix. With regard to her cervical spine injury. She has both a spinous process fracture of C6 but also a somewhat unusual vertebral body fracture of C6. The main aspect that was fractured on the vertebral body of C6 is an osteophyte extending toward the uncovertebral process on the right side. The remainder the vertebral body and the great majority the disc appeared to be and injured. Her alignment is good. The fracture appears stable least at this point. I recommend continuing an Aspen cervical collar. An she mobilizes here over the next couple days and I would like to get an upright lateral cervical spine x-ray to ensure that she is maintaining her alignment.  Shantele Reller A 12/14/2014, 8:47 AM

## 2014-12-14 NOTE — Progress Notes (Signed)
Spoke with cards, resume plavix when able.  Call with further assistance.  Sara Lynch, ANP-BC
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
## 2014-12-15 ENCOUNTER — Inpatient Hospital Stay (HOSPITAL_COMMUNITY): Payer: Medicare Other

## 2014-12-15 DIAGNOSIS — I2510 Atherosclerotic heart disease of native coronary artery without angina pectoris: Secondary | ICD-10-CM

## 2014-12-15 DIAGNOSIS — Z01810 Encounter for preprocedural cardiovascular examination: Secondary | ICD-10-CM

## 2014-12-15 DIAGNOSIS — E785 Hyperlipidemia, unspecified: Secondary | ICD-10-CM

## 2014-12-15 MED ORDER — PRAVASTATIN SODIUM 40 MG PO TABS
40.0000 mg | ORAL_TABLET | Freq: Every day | ORAL | Status: DC
  Administered 2014-12-15 – 2014-12-17 (×3): 40 mg via ORAL
  Filled 2014-12-15 (×4): qty 1

## 2014-12-15 MED ORDER — DOCUSATE SODIUM 50 MG/5ML PO LIQD
100.0000 mg | Freq: Two times a day (BID) | ORAL | Status: DC
  Administered 2014-12-15 – 2014-12-18 (×5): 100 mg via ORAL
  Filled 2014-12-15 (×8): qty 10

## 2014-12-15 NOTE — Progress Notes (Signed)
Patient ID: Sara Lynch, female   DOB: [birth year], 81 y.o.   MRN: [id number] I spoke with her daughters at the bedside. Georganna Skeans, MD, MPH, FACS Trauma: [phone] General Surgery: [phone]
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
## 2014-12-15 NOTE — Progress Notes (Signed)
Overall doing well. No complaints of headache. No significant neck pain. No radicular pain.  Awake and alert. Oriented and appropriate. Motor and sensory function intact albeit somewhat limited exam secondary to left shoulder a mobilization and pain.  Status post slight posttraumatic subarachnoid hemorrhage without evidence of significant extra-axial fluid collection or intraparenchymal hemorrhage. Patient may continue on Plavix from my standpoint. Okay for him my standpoint to move forward with orthopedic surgery.  C6 spinous process and lateral osteophyte/body fracture. Continue collar. Upright x-rays on Thursday morning to evaluate for stability.
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
## 2014-12-15 NOTE — Progress Notes (Addendum)
Rehab Admissions Coordinator Note:  Patient was screened by Raquel Racey L for appropriateness for an Inpatient Acute Rehab Consult.  At this time, we are recommending Inpatient Rehab consult. Rehab consult has been placed and an admission coordinator will follow up. Thanks.  Naeema Patlan L 12/15/2014, 12:28 PM  I can be reached at [phone].
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
## 2014-12-15 NOTE — Progress Notes (Signed)
Awaiting cardiology consult to give risk assessment for surgery.  Again, nonop treatment is a reasonable option if cards determines risk for surgery is too high.  Final ortho dispo will depend on cardiology input.  Appreciate their input.  Patient is otherwise stable from ortho standpoint.  Azucena Cecil, MD [place] 8:07 AM

## 2014-12-16 LAB — BASIC METABOLIC PANEL
Anion gap: 7 (ref 5–15)
BUN: 14 mg/dL (ref 6–20)
CO2: 26 mmol/L (ref 22–32)
Calcium: 8.7 mg/dL — ABNORMAL LOW (ref 8.9–10.3)
Chloride: 103 mmol/L (ref 101–111)
Creatinine, Ser: 0.87 mg/dL (ref 0.44–1.00)
GFR calc non Af Amer: 60 mL/min — ABNORMAL LOW (ref >60)
Glucose, Bld: 183 mg/dL — ABNORMAL HIGH (ref 65–99)
Potassium: 3.5 mmol/L (ref 3.5–5.1)
Sodium: 136 mmol/L (ref 135–145)

## 2014-12-16 LAB — CBC
HCT: 21.9 % — ABNORMAL LOW (ref 36.0–46.0)
HCT: 33.3 % — ABNORMAL LOW (ref 36.0–46.0)
Hemoglobin: 7.4 g/dL — ABNORMAL LOW (ref 12.0–15.0)
Hemoglobin: 11.3 g/dL — ABNORMAL LOW (ref 12.0–15.0)
MCH: 32.6 pg (ref 26.0–34.0)
MCH: 31.4 pg (ref 26.0–34.0)
MCHC: 33.8 g/dL (ref 30.0–36.0)
MCHC: 33.9 g/dL (ref 30.0–36.0)
MCV: 96.5 fL (ref 78.0–100.0)
MCV: 92.5 fL (ref 78.0–100.0)
PLATELETS: 136 10*3/uL — AB (ref 150–400)
Platelets: 143 10*3/uL — ABNORMAL LOW (ref 150–400)
RBC: 2.27 MIL/uL — ABNORMAL LOW (ref 3.87–5.11)
RBC: 3.6 MIL/uL — AB (ref 3.87–5.11)
RDW: 15.5 % (ref 11.5–15.5)
RDW: 15.1 % (ref 11.5–15.5)
WBC: 7.3 10*3/uL (ref 4.0–10.5)
WBC: 8.4 10*3/uL (ref 4.0–10.5)

## 2014-12-16 LAB — PREPARE RBC (CROSSMATCH)

## 2014-12-16 MED ORDER — ASPIRIN 81 MG PO CHEW
81.0000 mg | CHEWABLE_TABLET | Freq: Every day | ORAL | Status: DC
  Administered 2014-12-16 – 2014-12-18 (×2): 81 mg via ORAL
  Filled 2014-12-16 (×2): qty 1

## 2014-12-16 MED ORDER — SODIUM CHLORIDE 0.9 % IV SOLN
Freq: Once | INTRAVENOUS | Status: AC
  Administered 2014-12-16: 09:00:00 via INTRAVENOUS
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
## 2014-12-16 NOTE — Progress Notes (Signed)
Overall stable. To operating room for left shoulder surgery tomorrow. No new issues from a neurosurgical standpoint.

## 2014-12-16 NOTE — Clinical Social Work Note (Signed)
Clinical Social Worker continuing to follow patient and family for support and discharge planning needs.  CSW spoke with patient and patient daughter at bedside regarding questions from patient family.  Patient family has verbalized interest in inpatient rehab on site and is hopeful that patient will be a good candidate at discharge.  Patient has plans to go to the OR tomorrow.  CSW provided guidance on the process and will update inpatient rehab admissions coordinator of patient plan to discharge home with daughter and 24/7 support.  CSW remains available for support as needed.  Barbette Or, [place]

## 2014-12-16 NOTE — H&P (Signed)
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
## 2014-12-16 NOTE — Progress Notes (Signed)
Rehab admissions - Evaluated for possible admission.  I met with patient at the bedside.  I left rehab brochures with patient.  She is interested in inpatient rehab admission.  Plans are for surgery tomorrow.  I will follow along for timing of inpatient rehab admission.  Call me for questions.  #[phone]

## 2014-12-16 NOTE — Progress Notes (Signed)
PT Cancellation Note  Patient Details Name: Antonina Deziel MRN: [id number] DOB: Sep 30, 1928   Cancelled Treatment:    Reason Eval/Treat Not Completed: Patient declined, no reason specified.  Up already 2 times today, now cold and not wanting to get up.  Will defer to 5/25.  12/16/2014  Donnella Sham, [place] [phone]  (pager)   Nysia Dell, Tessie Fass 12/16/2014, 3:27 PM
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
## 2014-12-16 NOTE — Progress Notes (Signed)
Discussed with family and patient that surgery is scheduled for tomorrow. Patient and family understand the r/b/a to surgery.  They wish to proceed. Hgb 7.4 - recommend transfusing 2 units today for expected surgical blood loss. Need 2 units available also. Will continue the plavix through surgery. NPO after midnight Family to sign consent.  Azucena Cecil, MD [place] 8:01 AM

## 2014-12-17 ENCOUNTER — Inpatient Hospital Stay (HOSPITAL_COMMUNITY): Payer: Medicare Other | Admitting: Anesthesiology

## 2014-12-17 ENCOUNTER — Encounter (HOSPITAL_COMMUNITY): Admission: EM | Disposition: A | Discharge status: Rehabilitation Facility | Payer: Self-pay | Source: Home / Self Care

## 2014-12-17 ENCOUNTER — Encounter (HOSPITAL_COMMUNITY): Payer: Self-pay | Admitting: Certified Registered Nurse Anesthetist

## 2014-12-17 ENCOUNTER — Inpatient Hospital Stay (HOSPITAL_COMMUNITY): Payer: Medicare Other

## 2014-12-17 HISTORY — PX: ORIF HUMERUS FRACTURE: SHX2126

## 2014-12-17 LAB — TYPE AND SCREEN
ABO/RH(D): B POS
ANTIBODY SCREEN: NEGATIVE
UNIT DIVISION: 0
Unit division: 0
Unit division: 0

## 2014-12-17 LAB — BASIC METABOLIC PANEL
ANION GAP: 8 (ref 5–15)
BUN: 14 mg/dL (ref 6–20)
CALCIUM: 8.5 mg/dL — AB (ref 8.9–10.3)
CO2: 27 mmol/L (ref 22–32)
Chloride: 101 mmol/L (ref 101–111)
Creatinine, Ser: 0.9 mg/dL (ref 0.44–1.00)
GFR, EST NON AFRICAN AMERICAN: 58 mL/min — AB (ref >60)
GLUCOSE: 203 mg/dL — AB (ref 65–99)
Potassium: 4.4 mmol/L (ref 3.5–5.1)
SODIUM: 136 mmol/L (ref 135–145)

## 2014-12-17 LAB — CBC
HCT: 33.2 % — ABNORMAL LOW (ref 36.0–46.0)
Hemoglobin: 11.3 g/dL — ABNORMAL LOW (ref 12.0–15.0)
MCH: 31.5 pg (ref 26.0–34.0)
MCHC: 34 g/dL (ref 30.0–36.0)
MCV: 92.5 fL (ref 78.0–100.0)
PLATELETS: 138 10*3/uL — AB (ref 150–400)
RBC: 3.59 MIL/uL — ABNORMAL LOW (ref 3.87–5.11)
RDW: 15.6 % — ABNORMAL HIGH (ref 11.5–15.5)
WBC: 7.9 10*3/uL (ref 4.0–10.5)

## 2014-12-17 LAB — GLUCOSE, CAPILLARY
GLUCOSE-CAPILLARY: 127 mg/dL — AB (ref 65–99)
Glucose-Capillary: 138 mg/dL — ABNORMAL HIGH (ref 65–99)
Glucose-Capillary: 156 mg/dL — ABNORMAL HIGH (ref 65–99)
Glucose-Capillary: 183 mg/dL — ABNORMAL HIGH (ref 65–99)

## 2014-12-17 SURGERY — OPEN REDUCTION INTERNAL FIXATION (ORIF) PROXIMAL HUMERUS FRACTURE
Anesthesia: General | Site: Arm Upper | Laterality: Left

## 2014-12-17 MED ORDER — CLINDAMYCIN PHOSPHATE 600 MG/50ML IV SOLN
600.0000 mg | INTRAVENOUS | Status: AC
  Administered 2014-12-17: 600 mg via INTRAVENOUS
  Filled 2014-12-17: qty 50

## 2014-12-17 MED ORDER — ONDANSETRON HCL 4 MG/2ML IJ SOLN
INTRAMUSCULAR | Status: AC
  Filled 2014-12-17: qty 2

## 2014-12-17 MED ORDER — PROPOFOL 10 MG/ML IV BOLUS
INTRAVENOUS | Status: AC
  Filled 2014-12-17: qty 20

## 2014-12-17 MED ORDER — SODIUM CHLORIDE 0.9 % IV SOLN
10000.0000 ug | INTRAVENOUS | Status: DC | PRN
  Administered 2014-12-17 (×2): 50 ug/min via INTRAVENOUS

## 2014-12-17 MED ORDER — ACETAMINOPHEN 325 MG PO TABS
650.0000 mg | ORAL_TABLET | Freq: Four times a day (QID) | ORAL | Status: DC | PRN
  Administered 2014-12-18: 650 mg via ORAL
  Filled 2014-12-17: qty 2

## 2014-12-17 MED ORDER — INSULIN ASPART 100 UNIT/ML ~~LOC~~ SOLN
0.0000 [IU] | SUBCUTANEOUS | Status: DC
  Administered 2014-12-17: 1 [IU] via SUBCUTANEOUS
  Administered 2014-12-17: 2 [IU] via SUBCUTANEOUS
  Administered 2014-12-17: 7 [IU] via SUBCUTANEOUS
  Administered 2014-12-18: 2 [IU] via SUBCUTANEOUS
  Administered 2014-12-18: 3 [IU] via SUBCUTANEOUS
  Administered 2014-12-18: 5 [IU] via SUBCUTANEOUS

## 2014-12-17 MED ORDER — CLINDAMYCIN PHOSPHATE 600 MG/50ML IV SOLN
600.0000 mg | Freq: Four times a day (QID) | INTRAVENOUS | Status: AC
  Administered 2014-12-17 – 2014-12-18 (×3): 600 mg via INTRAVENOUS
  Filled 2014-12-17 (×4): qty 50

## 2014-12-17 MED ORDER — PHENYLEPHRINE HCL 10 MG/ML IJ SOLN
INTRAMUSCULAR | Status: AC
  Filled 2014-12-17: qty 1

## 2014-12-17 MED ORDER — VANCOMYCIN HCL 1000 MG IV SOLR
1000.0000 mg | INTRAVENOUS | Status: DC
  Filled 2014-12-17: qty 1000

## 2014-12-17 MED ORDER — LACTATED RINGERS IV SOLN
INTRAVENOUS | Status: DC
  Administered 2014-12-17 (×2): via INTRAVENOUS

## 2014-12-17 MED ORDER — HYDROMORPHONE HCL 1 MG/ML IJ SOLN
INTRAMUSCULAR | Status: AC
  Filled 2014-12-17: qty 1

## 2014-12-17 MED ORDER — FENTANYL CITRATE (PF) 250 MCG/5ML IJ SOLN
INTRAMUSCULAR | Status: AC
  Filled 2014-12-17: qty 5

## 2014-12-17 MED ORDER — CLINDAMYCIN PHOSPHATE 300 MG/2ML IJ SOLN
600.0000 mg | Freq: Once | INTRAMUSCULAR | Status: DC
  Filled 2014-12-17: qty 4

## 2014-12-17 MED ORDER — LACTATED RINGERS IV SOLN
INTRAVENOUS | Status: DC
  Administered 2014-12-17: 13:00:00 via INTRAVENOUS

## 2014-12-17 MED ORDER — PROPOFOL 10 MG/ML IV BOLUS
INTRAVENOUS | Status: DC | PRN
  Administered 2014-12-17: 140 mg via INTRAVENOUS

## 2014-12-17 MED ORDER — LIDOCAINE HCL (CARDIAC) 20 MG/ML IV SOLN
INTRAVENOUS | Status: DC | PRN
  Administered 2014-12-17: 60 mg via INTRAVENOUS

## 2014-12-17 MED ORDER — ONDANSETRON HCL 4 MG/2ML IJ SOLN: 4.0000 mg | Freq: Four times a day (QID) | INTRAMUSCULAR | Status: DC | PRN

## 2014-12-17 MED ORDER — PHENYLEPHRINE 40 MCG/ML (10ML) SYRINGE FOR IV PUSH (FOR BLOOD PRESSURE SUPPORT)
PREFILLED_SYRINGE | INTRAVENOUS | Status: AC
  Filled 2014-12-17: qty 10

## 2014-12-17 MED ORDER — SUCCINYLCHOLINE CHLORIDE 20 MG/ML IJ SOLN
INTRAMUSCULAR | Status: DC | PRN
  Administered 2014-12-17: 80 mg via INTRAVENOUS

## 2014-12-17 MED ORDER — HYDROMORPHONE HCL 1 MG/ML IJ SOLN
0.2500 mg | INTRAMUSCULAR | Status: DC | PRN
  Administered 2014-12-17: 0.5 mg via INTRAVENOUS

## 2014-12-17 MED ORDER — PHENYLEPHRINE HCL 10 MG/ML IJ SOLN
INTRAMUSCULAR | Status: DC | PRN
  Administered 2014-12-17 (×2): 40 ug via INTRAVENOUS
  Administered 2014-12-17: 100 ug via INTRAVENOUS
  Administered 2014-12-17: 40 ug via INTRAVENOUS

## 2014-12-17 MED ORDER — ONDANSETRON HCL 4 MG/2ML IJ SOLN
INTRAMUSCULAR | Status: DC | PRN
  Administered 2014-12-17: 4 mg via INTRAVENOUS

## 2014-12-17 MED ORDER — DEXAMETHASONE SODIUM PHOSPHATE 4 MG/ML IJ SOLN
INTRAMUSCULAR | Status: AC
  Filled 2014-12-17: qty 1

## 2014-12-17 MED ORDER — SODIUM CHLORIDE 0.9 % IV SOLN
1000.0000 mg | INTRAVENOUS | Status: DC | PRN
  Administered 2014-12-17: 1000 mg via INTRAVENOUS

## 2014-12-17 MED ORDER — PHENYLEPHRINE HCL 10 MG/ML IJ SOLN: 10000.0000 ug | INTRAMUSCULAR | Status: DC | PRN

## 2014-12-17 MED ORDER — FENTANYL CITRATE (PF) 100 MCG/2ML IJ SOLN
INTRAMUSCULAR | Status: DC | PRN
  Administered 2014-12-17 (×3): 50 ug via INTRAVENOUS

## 2014-12-17 MED ORDER — VANCOMYCIN HCL IN DEXTROSE 1-5 GM/200ML-% IV SOLN
1000.0000 mg | INTRAVENOUS | Status: DC
  Filled 2014-12-17: qty 200

## 2014-12-17 MED ORDER — ACETAMINOPHEN 650 MG RE SUPP: 650.0000 mg | Freq: Four times a day (QID) | RECTAL | Status: DC | PRN

## 2014-12-17 SURGICAL SUPPLY — 68 items
2.7MM MEASURING DEVICE ×2 IMPLANT
3.2MM DRILL SET ×2 IMPLANT
3.2MM MEASURING DEVICE ×3 IMPLANT
BIT DRILL 3.2 (BIT) ×2
BIT DRILL 3.2XCALB NS DISP (BIT) ×1 IMPLANT
BIT DRILL CALIBRATED 2.7 (BIT) ×2 IMPLANT
BIT DRILL CALIBRATED 2.7MM (BIT) ×1
BIT DRL 3.2XCALB NS DISP (BIT) ×1
BLADE SURG 10 STRL SS (BLADE) IMPLANT
BNDG ESMARK 4X9 LF (GAUZE/BANDAGES/DRESSINGS) IMPLANT
COVER SURGICAL LIGHT HANDLE (MISCELLANEOUS) ×3 IMPLANT
CUFF TOURNIQUET SINGLE 18IN (TOURNIQUET CUFF) ×3 IMPLANT
CUFF TOURNIQUET SINGLE 24IN (TOURNIQUET CUFF) IMPLANT
DRAPE C-ARM 42X72 X-RAY (DRAPES) ×3 IMPLANT
DRAPE IMP U-DRAPE 54X76 (DRAPES) ×3 IMPLANT
DRAPE INCISE IOBAN 66X45 STRL (DRAPES) ×3 IMPLANT
DRAPE U-SHAPE 47X51 STRL (DRAPES) ×3 IMPLANT
DRSG MEPILEX BORDER 4X8 (GAUZE/BANDAGES/DRESSINGS) ×3 IMPLANT
DRSG TEGADERM 4X4.75 (GAUZE/BANDAGES/DRESSINGS) ×12 IMPLANT
ELECT CAUTERY BLADE 6.4 (BLADE) ×3 IMPLANT
ELECT REM PT RETURN 9FT ADLT (ELECTROSURGICAL) ×3
ELECTRODE REM PT RTRN 9FT ADLT (ELECTROSURGICAL) ×1 IMPLANT
EPIPHYSIS SHOULD BODYSIZE 10-5 (Knees) ×3 IMPLANT
FACESHIELD WRAPAROUND (MASK) ×3 IMPLANT
GAUZE SPONGE 4X4 12PLY STRL (GAUZE/BANDAGES/DRESSINGS) ×3 IMPLANT
GAUZE XEROFORM 5X9 LF (GAUZE/BANDAGES/DRESSINGS) ×3 IMPLANT
GLOVE BIOGEL PI ORTHO PRO SZ7 (GLOVE) ×2
GLOVE NEODERM STRL 7.5 LF PF (GLOVE) ×2 IMPLANT
GLOVE PI ORTHO PRO STRL SZ7 (GLOVE) ×1 IMPLANT
GLOVE SURG NEODERM 7.5  LF PF (GLOVE) ×4
GLOVE SURG SS PI 6.5 STRL IVOR (GLOVE) ×3 IMPLANT
GOWN STRL REIN XL XLG (GOWN DISPOSABLE) ×9 IMPLANT
K-WIRE 2X5 SS THRDED S3 (WIRE) ×6
KIT BASIN OR (CUSTOM PROCEDURE TRAY) ×3 IMPLANT
KIT ROOM TURNOVER OR (KITS) ×3 IMPLANT
KWIRE 2X5 SS THRDED S3 (WIRE) ×2 IMPLANT
MANIFOLD NEPTUNE II (INSTRUMENTS) ×3 IMPLANT
NS IRRIG 1000ML POUR BTL (IV SOLUTION) ×3 IMPLANT
PACK SHOULDER (CUSTOM PROCEDURE TRAY) ×3 IMPLANT
PACK UNIVERSAL I (CUSTOM PROCEDURE TRAY) ×3 IMPLANT
PAD ARMBOARD 7.5X6 YLW CONV (MISCELLANEOUS) ×6 IMPLANT
PAD CAST 4YDX4 CTTN HI CHSV (CAST SUPPLIES) ×1 IMPLANT
PADDING CAST COTTON 4X4 STRL (CAST SUPPLIES) ×2
PEG LOCKING 3.2X32 (Peg) ×3 IMPLANT
PEG LOCKING 3.2X40 (Peg) ×3 IMPLANT
PEG LOCKING 3.2X42 (Screw) ×6 IMPLANT
PEG LOCKING 3.2X48 (Peg) ×3 IMPLANT
PEG LOCKING 3.2X52 (Peg) ×3 IMPLANT
SCREW LOCK CORT STAR 3.5X28 (Screw) ×3 IMPLANT
SCREW LP NL T15 3.5X24 (Screw) ×3 IMPLANT
SCREW LP NL T15 3.5X26 (Screw) ×3 IMPLANT
SEALER BIPOLAR AQUA 6.0 (INSTRUMENTS) ×3 IMPLANT
SLEEVE MEASURING 2.7 (BIT) ×3 IMPLANT
SLEEVE MEASURING 3.2 (BIT) ×2 IMPLANT
SLING ARM IMMOBILIZER LRG (SOFTGOODS) ×3 IMPLANT
SPONGE LAP 18X18 X RAY DECT (DISPOSABLE) ×3 IMPLANT
STAPLER VISISTAT 35W (STAPLE) IMPLANT
SUCTION FRAZIER TIP 10 FR DISP (SUCTIONS) IMPLANT
SUT ETHILON 3 0 PS 1 (SUTURE) ×6 IMPLANT
SUT VIC AB 0 CT1 27 (SUTURE) ×4
SUT VIC AB 0 CT1 27XBRD ANBCTR (SUTURE) ×2 IMPLANT
SUT VIC AB 2-0 CT1 27 (SUTURE) ×4
SUT VIC AB 2-0 CT1 TAPERPNT 27 (SUTURE) ×2 IMPLANT
SYR CONTROL 10ML LL (SYRINGE) IMPLANT
TOWEL OR 17X24 6PK STRL BLUE (TOWEL DISPOSABLE) IMPLANT
TOWEL OR 17X26 10 PK STRL BLUE (TOWEL DISPOSABLE) ×3 IMPLANT
UNDERPAD 30X30 INCONTINENT (UNDERPADS AND DIAPERS) ×3 IMPLANT
WATER STERILE IRR 1000ML POUR (IV SOLUTION) ×3 IMPLANT

## 2014-12-17 NOTE — Transfer of Care (Signed)
Immediate Anesthesia Transfer of Care Note  Patient: Sara Lynch  Procedure(s) Performed: Procedure(s): OPEN REDUCTION INTERNAL FIXATION (ORIF) LEFT PROXIMAL HUMERUS FRACTURE (Left)  Patient Location: PACU  Anesthesia Type:General  Level of Consciousness: awake, alert , oriented, patient cooperative and responds to stimulation  Airway & Oxygen Therapy: Patient Spontanous Breathing and Patient connected to nasal cannula oxygen  Post-op Assessment: Report given to RN, Post -op Vital signs reviewed and stable, Patient moving all extremities X 4 and Patient able to stick tongue midline  Post vital signs: stable  Last Vitals:  Filed Vitals:   12/17/14 1144  BP:   Pulse:   Temp: 36.9 C  Resp:     Complications: No apparent anesthesia complications

## 2014-12-17 NOTE — Progress Notes (Signed)
Overall stable. No new issues from my standpoint. Needs upright lateral cervical spine x-ray in collar tomorrow.

## 2014-12-17 NOTE — Discharge Instructions (Signed)
1. Pendulum exercises of LUE.  Very gentle ROM of left shoulder allowed. 2. Non weight bearing to LUE.  Sling at all times. 3. No ROM above level of shoulder.

## 2014-12-17 NOTE — Anesthesia Procedure Notes (Signed)
Procedure Name: Intubation Date/Time: 12/17/2014 2:35 PM Performed by: Vennie Homans Pre-anesthesia Checklist: Patient identified, Timeout performed, Emergency Drugs available, Suction available and Patient being monitored Patient Re-evaluated:Patient Re-evaluated prior to inductionOxygen Delivery Method: Circle system utilized Preoxygenation: Pre-oxygenation with 100% oxygen Intubation Type: IV induction Ventilation: Mask ventilation without difficulty Laryngoscope Size: Mac and 3 Grade View: Grade I Tube type: Oral Tube size: 7.0 mm Number of attempts: 1 Airway Equipment and Method: Stylet Placement Confirmation: ETT inserted through vocal cords under direct vision,  positive ETCO2 and breath sounds checked- equal and bilateral Secured at: 22 cm Tube secured with: Tape Dental Injury: Teeth and Oropharynx as per pre-operative assessment

## 2014-12-17 NOTE — Op Note (Signed)
Date of Surgery: 12/17/2014  INDICATIONS: Ms. Styles is a 81 y.o.-year-old female who sustained a left proximal humerus fracture;  The patient and family did consent to the procedure after discussion of the risks and benefits.  PREOPERATIVE DIAGNOSIS: Left proximal humerus fracture  POSTOPERATIVE DIAGNOSIS: Same.  PROCEDURE: Open treatment with internal fixation of left proximal humerus fracture  SURGEON: N. Eduard Roux, M.D.  ASSIST: April Green, RNFA.  ANESTHESIA:  general  IV FLUIDS AND URINE: See anesthesia.  ESTIMATED BLOOD LOSS: 150 mL.  IMPLANTS: Biomet ALPS Proximal humerus low plate  DRAINS: none  COMPLICATIONS: None.  DESCRIPTION OF PROCEDURE: The patient was brought to the operating room and placed supine on the operating table.  The patient had been signed prior to the procedure and this was documented. The patient had the anesthesia placed by the anesthesiologist.  A time-out was performed to confirm that this was the correct patient, site, side and location. The patient had an SCDs on the lower extremities. The patient did receive antibiotics prior to the incision and was re-dosed during the procedure as needed at indicated intervals.  The patient had the operative extremity prepped and draped in the standard surgical fashion.    We palpated the lateral edge of the acromion and used a anterolateral acromial approach to the proximal humerus. Full-thickness flaps were created off of the fascia and the deltoid raphe.  The raphae was split using finger dissection. The fracture was then exposed proximally. The bursa was released. We then identified the axillary nerve that was traversing across the humeral shaft approximately 6 cm distal to the lateral edge of the acromion. This was tagged with a vessel loop. Once the neurovascular structure was identified we then cleared the humeral shaft for the plate. We then slid the proximal humerus plate to the appropriate position. We  obtained a reduction of the fracture.  We then placed a K wire through the the plate and placed it as center center as possible in the humeral head. Once we felt we had appropriate positioning of the plate and right shoulder reduction we placed 2 humeral shaft screws. During the drilling of one of the holes for the shaft screw we noticed that the drill bit broke off and was lodged inside the shaft of the humerus. Because the drill bit was inside the humerus we could not retrieve this. The decision was made to leave inside the bone as this would likely not negatively affect her. Of note her bone quality was poor.  We did use 1 nonlocking and 1 locking screw for the shaft. We then placed our inferior calcar screw and measured the appropriate depth. We placed the inferior calcar peg. We then placed 5 more locking pegs through the proximal portion of the plate and as subchondral to the humeral head as possible. Final x-rays were taken. The shoulder and the humerus moved as one unit.  We then thoroughly irrigated wound and closed it in a layer fashion using 2.0 Vicryl for the subcutaneous layer and a running 4-0 Monocryl for the skin. Dermabond was placed on the incision and Steri-Strips were placed over this.  A sterile dressing was applied. In a sling was also placed on the patient.  The patient was extubated and transferred to the PACU in stable condition. All sponge counts correct.  Postoperative plan: The patient will be nonweightbearing to the left upper extremity. She will initiate pendulum exercises and gentle range of motion immediately with occupational therapy. She is to  remain in the sling otherwise. We will see her back in the office in 2 weeks for a wound check.  Azucena Cecil, MD [place] 4:25 PM
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
## 2014-12-17 NOTE — Anesthesia Preprocedure Evaluation (Addendum)
Anesthesia Evaluation  Patient identified by MRN, date of birth, ID band Patient awake    Reviewed: Allergy & Precautions, NPO status , Patient's Chart, lab work & pertinent test results  Airway Mallampati: II   Neck ROM: limited  Mouth opening: Limited Mouth Opening Comment: Pt has c-collar in place. Dental   Pulmonary  breath sounds clear to auscultation        Cardiovascular hypertension, + angina + CAD, + Past MI and + Cardiac Stents + Valvular Problems/Murmurs AS Rhythm:regular Rate:Normal  Recent DES placement.  Pt remains on ASA and plavix perioperatively as per Dr Stanford Breed.  Pt has mild AS.   Neuro/Psych  Neuromuscular disease    GI/Hepatic GERD-  ,  Endo/Other    Renal/GU      Musculoskeletal   Abdominal   Peds  Hematology   Anesthesia Other Findings   Reproductive/Obstetrics                            Anesthesia Physical Anesthesia Plan  ASA: III  Anesthesia Plan: General   Post-op Pain Management:    Induction: Intravenous  Airway Management Planned: Oral ETT  Additional Equipment:   Intra-op Plan:   Post-operative Plan: Extubation in OR  Informed Consent: I have reviewed the patients History and Physical, chart, labs and discussed the procedure including the risks, benefits and alternatives for the proposed anesthesia with the patient or authorized representative who has indicated his/her understanding and acceptance.     Plan Discussed with: CRNA, Anesthesiologist and Surgeon  Anesthesia Plan Comments:         Anesthesia Quick Evaluation

## 2014-12-17 NOTE — Anesthesia Postprocedure Evaluation (Signed)
Anesthesia Post Note  Patient: Sara Lynch  Procedure(s) Performed: Procedure(s) (LRB): OPEN REDUCTION INTERNAL FIXATION (ORIF) LEFT PROXIMAL HUMERUS FRACTURE (Left)  Anesthesia type: General  Patient location: PACU  Post pain: Pain level controlled and Adequate analgesia  Post assessment: Post-op Vital signs reviewed, Patient's Cardiovascular Status Stable, Respiratory Function Stable, Patent Airway and Pain level controlled  Last Vitals:  Filed Vitals:   12/17/14 1725  BP:   Pulse:   Temp: 36.7 C  Resp:     Post vital signs: Reviewed and stable  Level of consciousness: awake, alert  and oriented  Complications: No apparent anesthesia complications
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
## 2014-12-17 NOTE — Progress Notes (Signed)
Occupational Therapy Cancellation Note    12/17/14 1200  OT Visit Information  Last OT Received On 12/17/14  Reason Eval/Treat Not Completed Patient at procedure or test/ unavailable  Lucille Passy, OTR/L [phone]

## 2014-12-18 ENCOUNTER — Inpatient Hospital Stay (HOSPITAL_COMMUNITY): Payer: Medicare Other

## 2014-12-18 ENCOUNTER — Inpatient Hospital Stay (HOSPITAL_COMMUNITY)
Admission: AD | Admit: 2014-12-18 | Discharge: 2014-12-25 | DRG: 949 | Disposition: A | Discharge status: Home / Self Care | Payer: Medicare Other | Source: Intra-hospital | Attending: Physical Medicine & Rehabilitation | Admitting: Physical Medicine & Rehabilitation

## 2014-12-18 DIAGNOSIS — I2510 Atherosclerotic heart disease of native coronary artery without angina pectoris: Secondary | ICD-10-CM

## 2014-12-18 DIAGNOSIS — Z09 Encounter for follow-up examination after completed treatment for conditions other than malignant neoplasm: Secondary | ICD-10-CM

## 2014-12-18 DIAGNOSIS — S42302A Unspecified fracture of shaft of humerus, left arm, initial encounter for closed fracture: Secondary | ICD-10-CM | POA: Diagnosis present

## 2014-12-18 DIAGNOSIS — S066X3S Traumatic subarachnoid hemorrhage with loss of consciousness of 1 hour to 5 hours 59 minutes, sequela: Secondary | ICD-10-CM | POA: Diagnosis not present

## 2014-12-18 DIAGNOSIS — S069XAA Unspecified intracranial injury with loss of consciousness status unknown, initial encounter: Secondary | ICD-10-CM | POA: Diagnosis present

## 2014-12-18 DIAGNOSIS — M7989 Other specified soft tissue disorders: Secondary | ICD-10-CM | POA: Diagnosis not present

## 2014-12-18 DIAGNOSIS — S0285XA Fracture of orbit, unspecified, initial encounter for closed fracture: Secondary | ICD-10-CM | POA: Diagnosis present

## 2014-12-18 DIAGNOSIS — S42302S Unspecified fracture of shaft of humerus, left arm, sequela: Secondary | ICD-10-CM

## 2014-12-18 DIAGNOSIS — M3320 Polymyositis, organ involvement unspecified: Secondary | ICD-10-CM

## 2014-12-18 DIAGNOSIS — S0181XA Laceration without foreign body of other part of head, initial encounter: Secondary | ICD-10-CM | POA: Diagnosis present

## 2014-12-18 DIAGNOSIS — I609 Nontraumatic subarachnoid hemorrhage, unspecified: Secondary | ICD-10-CM | POA: Diagnosis not present

## 2014-12-18 DIAGNOSIS — I10 Essential (primary) hypertension: Secondary | ICD-10-CM

## 2014-12-18 DIAGNOSIS — D62 Acute posthemorrhagic anemia: Secondary | ICD-10-CM | POA: Diagnosis not present

## 2014-12-18 DIAGNOSIS — K5900 Constipation, unspecified: Secondary | ICD-10-CM | POA: Diagnosis not present

## 2014-12-18 DIAGNOSIS — S42292D Other displaced fracture of upper end of left humerus, subsequent encounter for fracture with routine healing: Secondary | ICD-10-CM

## 2014-12-18 DIAGNOSIS — S066X9S Traumatic subarachnoid hemorrhage with loss of consciousness of unspecified duration, sequela: Secondary | ICD-10-CM | POA: Diagnosis not present

## 2014-12-18 DIAGNOSIS — Z7982 Long term (current) use of aspirin: Secondary | ICD-10-CM | POA: Diagnosis not present

## 2014-12-18 DIAGNOSIS — E785 Hyperlipidemia, unspecified: Secondary | ICD-10-CM

## 2014-12-18 DIAGNOSIS — M542 Cervicalgia: Secondary | ICD-10-CM | POA: Diagnosis not present

## 2014-12-18 DIAGNOSIS — M109 Gout, unspecified: Secondary | ICD-10-CM | POA: Diagnosis not present

## 2014-12-18 DIAGNOSIS — S12500A Unspecified displaced fracture of sixth cervical vertebra, initial encounter for closed fracture: Secondary | ICD-10-CM | POA: Diagnosis present

## 2014-12-18 DIAGNOSIS — S42202A Unspecified fracture of upper end of left humerus, initial encounter for closed fracture: Secondary | ICD-10-CM | POA: Diagnosis not present

## 2014-12-18 DIAGNOSIS — R739 Hyperglycemia, unspecified: Secondary | ICD-10-CM | POA: Diagnosis not present

## 2014-12-18 DIAGNOSIS — Z7902 Long term (current) use of antithrombotics/antiplatelets: Secondary | ICD-10-CM | POA: Diagnosis not present

## 2014-12-18 DIAGNOSIS — S066X0S Traumatic subarachnoid hemorrhage without loss of consciousness, sequela: Secondary | ICD-10-CM | POA: Diagnosis not present

## 2014-12-18 DIAGNOSIS — Z853 Personal history of malignant neoplasm of breast: Secondary | ICD-10-CM | POA: Diagnosis not present

## 2014-12-18 DIAGNOSIS — S069X1S Unspecified intracranial injury with loss of consciousness of 30 minutes or less, sequela: Secondary | ICD-10-CM | POA: Diagnosis not present

## 2014-12-18 DIAGNOSIS — S066X0D Traumatic subarachnoid hemorrhage without loss of consciousness, subsequent encounter: Principal | ICD-10-CM

## 2014-12-18 DIAGNOSIS — I214 Non-ST elevation (NSTEMI) myocardial infarction: Secondary | ICD-10-CM

## 2014-12-18 DIAGNOSIS — S066XAA Traumatic subarachnoid hemorrhage with loss of consciousness status unknown, initial encounter: Secondary | ICD-10-CM | POA: Diagnosis present

## 2014-12-18 DIAGNOSIS — S42212D Unspecified displaced fracture of surgical neck of left humerus, subsequent encounter for fracture with routine healing: Secondary | ICD-10-CM

## 2014-12-18 DIAGNOSIS — Z79899 Other long term (current) drug therapy: Secondary | ICD-10-CM | POA: Diagnosis not present

## 2014-12-18 DIAGNOSIS — Z955 Presence of coronary angioplasty implant and graft: Secondary | ICD-10-CM | POA: Diagnosis not present

## 2014-12-18 DIAGNOSIS — Z791 Long term (current) use of non-steroidal anti-inflammatories (NSAID): Secondary | ICD-10-CM | POA: Diagnosis not present

## 2014-12-18 DIAGNOSIS — I252 Old myocardial infarction: Secondary | ICD-10-CM | POA: Diagnosis not present

## 2014-12-18 DIAGNOSIS — S066X1S Traumatic subarachnoid hemorrhage with loss of consciousness of 30 minutes or less, sequela: Secondary | ICD-10-CM

## 2014-12-18 DIAGNOSIS — H1130 Conjunctival hemorrhage, unspecified eye: Secondary | ICD-10-CM | POA: Diagnosis present

## 2014-12-18 DIAGNOSIS — S069X0S Unspecified intracranial injury without loss of consciousness, sequela: Secondary | ICD-10-CM | POA: Diagnosis not present

## 2014-12-18 DIAGNOSIS — S42352A Displaced comminuted fracture of shaft of humerus, left arm, initial encounter for closed fracture: Secondary | ICD-10-CM | POA: Diagnosis present

## 2014-12-18 DIAGNOSIS — S12500D Unspecified displaced fracture of sixth cervical vertebra, subsequent encounter for fracture with routine healing: Secondary | ICD-10-CM

## 2014-12-18 DIAGNOSIS — S023XXD Fracture of orbital floor, subsequent encounter for fracture with routine healing: Secondary | ICD-10-CM | POA: Diagnosis not present

## 2014-12-18 DIAGNOSIS — S069X9A Unspecified intracranial injury with loss of consciousness of unspecified duration, initial encounter: Secondary | ICD-10-CM | POA: Diagnosis present

## 2014-12-18 DIAGNOSIS — S199XXD Unspecified injury of neck, subsequent encounter: Secondary | ICD-10-CM | POA: Diagnosis not present

## 2014-12-18 DIAGNOSIS — S066X9A Traumatic subarachnoid hemorrhage with loss of consciousness of unspecified duration, initial encounter: Secondary | ICD-10-CM | POA: Diagnosis present

## 2014-12-18 LAB — BASIC METABOLIC PANEL
Anion gap: 11 (ref 5–15)
BUN: 19 mg/dL (ref 6–20)
CO2: 24 mmol/L (ref 22–32)
Calcium: 8.3 mg/dL — ABNORMAL LOW (ref 8.9–10.3)
Chloride: 98 mmol/L — ABNORMAL LOW (ref 101–111)
Creatinine, Ser: 1.02 mg/dL — ABNORMAL HIGH (ref 0.44–1.00)
GFR calc Af Amer: 57 mL/min — ABNORMAL LOW (ref >60)
GFR, EST NON AFRICAN AMERICAN: 49 mL/min — AB (ref >60)
Glucose, Bld: 259 mg/dL — ABNORMAL HIGH (ref 65–99)
Potassium: 4.2 mmol/L (ref 3.5–5.1)
SODIUM: 133 mmol/L — AB (ref 135–145)

## 2014-12-18 LAB — GLUCOSE, CAPILLARY
Glucose-Capillary: 151 mg/dL — ABNORMAL HIGH (ref 65–99)
Glucose-Capillary: 176 mg/dL — ABNORMAL HIGH (ref 65–99)
Glucose-Capillary: 207 mg/dL — ABNORMAL HIGH (ref 65–99)
Glucose-Capillary: 237 mg/dL — ABNORMAL HIGH (ref 65–99)
Glucose-Capillary: 253 mg/dL — ABNORMAL HIGH (ref 65–99)
Glucose-Capillary: 331 mg/dL — ABNORMAL HIGH (ref 65–99)

## 2014-12-18 LAB — CBC
HEMATOCRIT: 30.9 % — AB (ref 36.0–46.0)
HEMOGLOBIN: 10.6 g/dL — AB (ref 12.0–15.0)
MCH: 31.9 pg (ref 26.0–34.0)
MCHC: 34.3 g/dL (ref 30.0–36.0)
MCV: 93.1 fL (ref 78.0–100.0)
Platelets: 163 10*3/uL (ref 150–400)
RBC: 3.32 MIL/uL — AB (ref 3.87–5.11)
RDW: 15.3 % (ref 11.5–15.5)
WBC: 7.5 10*3/uL (ref 4.0–10.5)

## 2014-12-18 MED ORDER — CLOPIDOGREL BISULFATE 75 MG PO TABS
75.0000 mg | ORAL_TABLET | Freq: Every day | ORAL | Status: DC
  Administered 2014-12-19 – 2014-12-25 (×7): 75 mg via ORAL
  Filled 2014-12-18 (×8): qty 1

## 2014-12-18 MED ORDER — ALLOPURINOL 100 MG PO TABS
100.0000 mg | ORAL_TABLET | Freq: Every day | ORAL | Status: DC
  Administered 2014-12-19 – 2014-12-25 (×7): 100 mg via ORAL
  Filled 2014-12-18 (×8): qty 1

## 2014-12-18 MED ORDER — BISACODYL 10 MG RE SUPP
10.0000 mg | Freq: Every day | RECTAL | Status: DC | PRN
  Administered 2014-12-19: 10 mg via RECTAL
  Filled 2014-12-18: qty 1

## 2014-12-18 MED ORDER — POLYETHYLENE GLYCOL 3350 17 G PO PACK
17.0000 g | PACK | Freq: Every day | ORAL | Status: DC
  Administered 2014-12-18 – 2014-12-23 (×6): 17 g via ORAL
  Filled 2014-12-18 (×9): qty 1

## 2014-12-18 MED ORDER — DIPHENHYDRAMINE HCL 12.5 MG/5ML PO ELIX: 12.5000 mg | ORAL_SOLUTION | Freq: Four times a day (QID) | ORAL | Status: DC | PRN

## 2014-12-18 MED ORDER — NITROGLYCERIN 0.4 MG SL SUBL: 0.4000 mg | SUBLINGUAL_TABLET | SUBLINGUAL | Status: DC | PRN

## 2014-12-18 MED ORDER — INSULIN ASPART 100 UNIT/ML ~~LOC~~ SOLN: 0.0000 [IU] | SUBCUTANEOUS | Status: DC

## 2014-12-18 MED ORDER — MORPHINE SULFATE 15 MG PO TABS
7.5000 mg | ORAL_TABLET | ORAL | Status: DC | PRN
Start: 2014-12-18 — End: 2014-12-25
  Filled 2014-12-18: qty 1

## 2014-12-18 MED ORDER — DOCUSATE SODIUM 50 MG/5ML PO LIQD
100.0000 mg | Freq: Two times a day (BID) | ORAL | Status: DC
  Filled 2014-12-18 (×2): qty 10

## 2014-12-18 MED ORDER — GUAIFENESIN-DM 100-10 MG/5ML PO SYRP: 5.0000 mL | ORAL_SOLUTION | Freq: Four times a day (QID) | ORAL | Status: DC | PRN

## 2014-12-18 MED ORDER — INSULIN ASPART 100 UNIT/ML ~~LOC~~ SOLN: 0.0000 [IU] | Freq: Three times a day (TID) | SUBCUTANEOUS | Status: DC

## 2014-12-18 MED ORDER — FLEET ENEMA 7-19 GM/118ML RE ENEM: 1.0000 | ENEMA | Freq: Once | RECTAL | Status: AC | PRN

## 2014-12-18 MED ORDER — FUROSEMIDE 20 MG PO TABS
20.0000 mg | ORAL_TABLET | Freq: Every day | ORAL | Status: DC | PRN
  Filled 2014-12-18: qty 1

## 2014-12-18 MED ORDER — METHOCARBAMOL 500 MG PO TABS: 500.0000 mg | ORAL_TABLET | Freq: Four times a day (QID) | ORAL | Status: DC | PRN

## 2014-12-18 MED ORDER — TRAZODONE HCL 50 MG PO TABS: 25.0000 mg | ORAL_TABLET | Freq: Every evening | ORAL | Status: DC | PRN

## 2014-12-18 MED ORDER — ACETAMINOPHEN 500 MG PO TABS: 500.0000 mg | ORAL_TABLET | Freq: Four times a day (QID) | ORAL | Status: DC | PRN

## 2014-12-18 MED ORDER — INSULIN ASPART 100 UNIT/ML ~~LOC~~ SOLN: 0.0000 [IU] | Freq: Every day | SUBCUTANEOUS | Status: DC

## 2014-12-18 MED ORDER — PANTOPRAZOLE SODIUM 40 MG PO TBEC
40.0000 mg | DELAYED_RELEASE_TABLET | Freq: Every day | ORAL | Status: DC
  Administered 2014-12-19 – 2014-12-25 (×7): 40 mg via ORAL
  Filled 2014-12-18 (×5): qty 1

## 2014-12-18 MED ORDER — PRAVASTATIN SODIUM 40 MG PO TABS
40.0000 mg | ORAL_TABLET | Freq: Every day | ORAL | Status: DC
  Administered 2014-12-18 – 2014-12-24 (×7): 40 mg via ORAL
  Filled 2014-12-18 (×8): qty 1

## 2014-12-18 MED ORDER — ALUM & MAG HYDROXIDE-SIMETH 200-200-20 MG/5ML PO SUSP: 30.0000 mL | ORAL | Status: DC | PRN

## 2014-12-18 MED ORDER — ASPIRIN 81 MG PO CHEW
81.0000 mg | CHEWABLE_TABLET | Freq: Every day | ORAL | Status: DC
  Administered 2014-12-19 – 2014-12-25 (×7): 81 mg via ORAL
  Filled 2014-12-18 (×7): qty 1

## 2014-12-18 MED ORDER — METHOTREXATE 2.5 MG PO TABS
7.5000 mg | ORAL_TABLET | ORAL | Status: DC
  Administered 2014-12-24: 7.5 mg via ORAL
  Filled 2014-12-18: qty 3

## 2014-12-18 MED ORDER — AMLODIPINE BESYLATE 2.5 MG PO TABS
2.5000 mg | ORAL_TABLET | Freq: Every day | ORAL | Status: DC
  Administered 2014-12-19 – 2014-12-25 (×7): 2.5 mg via ORAL
  Filled 2014-12-18 (×8): qty 1

## 2014-12-18 MED ORDER — INSULIN ASPART 100 UNIT/ML ~~LOC~~ SOLN
0.0000 [IU] | Freq: Three times a day (TID) | SUBCUTANEOUS | Status: DC
  Administered 2014-12-18: 3 [IU] via SUBCUTANEOUS
  Administered 2014-12-19 (×2): 2 [IU] via SUBCUTANEOUS
  Administered 2014-12-19: 1 [IU] via SUBCUTANEOUS
  Administered 2014-12-20: 2 [IU] via SUBCUTANEOUS
  Administered 2014-12-20 – 2014-12-21 (×3): 1 [IU] via SUBCUTANEOUS
  Administered 2014-12-22 (×2): 2 [IU] via SUBCUTANEOUS
  Administered 2014-12-23: 1 [IU] via SUBCUTANEOUS

## 2014-12-18 MED ORDER — TRAMADOL HCL 50 MG PO TABS
50.0000 mg | ORAL_TABLET | Freq: Four times a day (QID) | ORAL | Status: DC | PRN
  Administered 2014-12-18 – 2014-12-25 (×6): 50 mg via ORAL
  Filled 2014-12-18 (×6): qty 1

## 2014-12-18 MED ORDER — BISACODYL 10 MG RE SUPP: 10.0000 mg | Freq: Every day | RECTAL | Status: DC | PRN

## 2014-12-18 MED ORDER — PROCHLORPERAZINE MALEATE 5 MG PO TABS
5.0000 mg | ORAL_TABLET | Freq: Four times a day (QID) | ORAL | Status: DC | PRN
Start: 2014-12-18 — End: 2014-12-25
  Filled 2014-12-18: qty 2

## 2014-12-18 MED ORDER — CARVEDILOL 12.5 MG PO TABS
12.5000 mg | ORAL_TABLET | Freq: Two times a day (BID) | ORAL | Status: DC
  Administered 2014-12-18 – 2014-12-25 (×14): 12.5 mg via ORAL
  Filled 2014-12-18 (×16): qty 1

## 2014-12-18 MED ORDER — POLYETHYLENE GLYCOL 3350 17 G PO PACK
17.0000 g | PACK | Freq: Every day | ORAL | Status: DC | PRN
  Filled 2014-12-18: qty 1

## 2014-12-18 MED ORDER — PROCHLORPERAZINE 25 MG RE SUPP: 12.5000 mg | Freq: Four times a day (QID) | RECTAL | Status: DC | PRN

## 2014-12-18 MED ORDER — ACETAMINOPHEN 325 MG PO TABS
325.0000 mg | ORAL_TABLET | ORAL | Status: DC | PRN
  Administered 2014-12-19: 650 mg via ORAL
  Filled 2014-12-18: qty 2

## 2014-12-18 MED ORDER — MORPHINE SULFATE 15 MG PO TABS: 15.0000 mg | ORAL_TABLET | ORAL | Status: DC | PRN

## 2014-12-18 MED ORDER — PROCHLORPERAZINE EDISYLATE 5 MG/ML IJ SOLN: 5.0000 mg | Freq: Four times a day (QID) | INTRAMUSCULAR | Status: DC | PRN
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
## 2014-12-18 NOTE — Progress Notes (Signed)
C-spine x-ray with normal alignment and no evidence of overt instability.  C6 fracture will be managed with external Aspen orthosis for 6 weeks. Patient may be mobilized ad lib. from my standpoint.

## 2014-12-18 NOTE — Progress Notes (Signed)
Rehab admissions - I spoke with Dr. Grandville Silos and I have clearance for acute inpatient rehab admission for today.  I talked with bedside RN and explained that I can move patient directly to inpatient rehab today.  Bed available and will admit to inpatient rehab today.  Call me for questions.  #[phone]
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
## 2014-12-18 NOTE — Progress Notes (Signed)
Left shoulder dressing c/d/i Axillary nerve intact Expected post op acute blood loss anemia Stable from ortho standpoint for transfer to floor OT for pendulum and gentle ROM of left shoulder  N. Eduard Roux, MD [place] 7:43 AM
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
## 2014-12-18 NOTE — Progress Notes (Signed)
Inpatient Diabetes Program Recommendations  AACE/ADA: New Consensus Statement on Inpatient Glycemic Control (2013)  Target Ranges:  Prepandial:   less than 140 mg/dL      Peak postprandial:   less than 180 mg/dL (1-2 hours)      Critically ill patients:  140 - 180 mg/dL  Results for LEYANA, WHIDDEN (MRN [id number]) as of 12/18/2014 08:51  Ref. Range 12/17/2014 17:23 12/17/2014 19:58 12/17/2014 23:41 12/18/2014 03:51 12/18/2014 08:02  Glucose-Capillary Latest Ref Range: 65-99 mg/dL 156 (H) 183 (H) 331 (H) 253 (H) 151 (H)   Inpatient Diabetes Program Recommendations Insulin - Meal Coverage: add Novolog 3-4 units TID with meals for elevated postprandial cbgs Thank you  Raoul Pitch BSN, RN,CDE Inpatient Diabetes Coordinator [phone] (team pager)

## 2014-12-18 NOTE — Progress Notes (Signed)
Attempted to call report to [facility]. They stated they will call me back.
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
## 2014-12-18 NOTE — Clinical Social Work Note (Signed)
Clinical Social Worker continuing to follow patient and family for support and discharge planning needs.  Patient has been accepted to inpatient rehab with plans to transfer today.  Patient and family agreeable with current discharge plans.  CSW will sign off for now as social work intervention is no longer needed. Please consult [place] again if new need arises.  Barbette Or, [place]

## 2014-12-19 ENCOUNTER — Ambulatory Visit (HOSPITAL_COMMUNITY): Payer: Medicare Other

## 2014-12-19 ENCOUNTER — Inpatient Hospital Stay (HOSPITAL_COMMUNITY): Payer: Medicare Other

## 2014-12-19 ENCOUNTER — Inpatient Hospital Stay (HOSPITAL_COMMUNITY): Payer: Medicare Other | Admitting: Speech Pathology

## 2014-12-19 ENCOUNTER — Encounter (HOSPITAL_COMMUNITY): Payer: Self-pay | Admitting: Orthopaedic Surgery

## 2014-12-19 ENCOUNTER — Inpatient Hospital Stay (HOSPITAL_COMMUNITY): Payer: Medicare Other | Admitting: Physical Therapy

## 2014-12-19 DIAGNOSIS — I609 Nontraumatic subarachnoid hemorrhage, unspecified: Secondary | ICD-10-CM

## 2014-12-19 DIAGNOSIS — S42352A Displaced comminuted fracture of shaft of humerus, left arm, initial encounter for closed fracture: Secondary | ICD-10-CM

## 2014-12-19 DIAGNOSIS — M7989 Other specified soft tissue disorders: Secondary | ICD-10-CM

## 2014-12-19 LAB — GLUCOSE, CAPILLARY
Glucose-Capillary: 139 mg/dL — ABNORMAL HIGH (ref 65–99)
Glucose-Capillary: 151 mg/dL — ABNORMAL HIGH (ref 65–99)
Glucose-Capillary: 182 mg/dL — ABNORMAL HIGH (ref 65–99)
Glucose-Capillary: 161 mg/dL — ABNORMAL HIGH (ref 65–99)

## 2014-12-19 LAB — URINALYSIS, ROUTINE W REFLEX MICROSCOPIC
BILIRUBIN URINE: NEGATIVE
GLUCOSE, UA: NEGATIVE mg/dL
HGB URINE DIPSTICK: NEGATIVE
KETONES UR: NEGATIVE mg/dL
Nitrite: NEGATIVE
PH: 6 (ref 5.0–8.0)
Protein, ur: NEGATIVE mg/dL
SPECIFIC GRAVITY, URINE: 1.009 (ref 1.005–1.030)
Urobilinogen, UA: 0.2 mg/dL (ref 0.0–1.0)

## 2014-12-19 LAB — CBC WITH DIFFERENTIAL/PLATELET
BASOS PCT: 0 % (ref 0–1)
Basophils Absolute: 0 10*3/uL (ref 0.0–0.1)
Eosinophils Absolute: 0.1 10*3/uL (ref 0.0–0.7)
Eosinophils Relative: 1 % (ref 0–5)
HEMATOCRIT: 30.5 % — AB (ref 36.0–46.0)
Hemoglobin: 10.3 g/dL — ABNORMAL LOW (ref 12.0–15.0)
LYMPHS ABS: 1.3 10*3/uL (ref 0.7–4.0)
Lymphocytes Relative: 10 % — ABNORMAL LOW (ref 12–46)
MCH: 31.8 pg (ref 26.0–34.0)
MCHC: 33.8 g/dL (ref 30.0–36.0)
MCV: 94.1 fL (ref 78.0–100.0)
MONOS PCT: 9 % (ref 3–12)
Monocytes Absolute: 1.2 10*3/uL — ABNORMAL HIGH (ref 0.1–1.0)
NEUTROS ABS: 10.1 10*3/uL — AB (ref 1.7–7.7)
NEUTROS PCT: 80 % — AB (ref 43–77)
Platelets: 220 10*3/uL (ref 150–400)
RBC: 3.24 MIL/uL — ABNORMAL LOW (ref 3.87–5.11)
RDW: 15.3 % (ref 11.5–15.5)
WBC: 12.8 10*3/uL — ABNORMAL HIGH (ref 4.0–10.5)

## 2014-12-19 LAB — COMPREHENSIVE METABOLIC PANEL
ALT: 19 U/L (ref 14–54)
AST: 20 U/L (ref 15–41)
Albumin: 2.9 g/dL — ABNORMAL LOW (ref 3.5–5.0)
Alkaline Phosphatase: 56 U/L (ref 38–126)
Anion gap: 7 (ref 5–15)
BILIRUBIN TOTAL: 1.5 mg/dL — AB (ref 0.3–1.2)
BUN: 25 mg/dL — ABNORMAL HIGH (ref 6–20)
CO2: 27 mmol/L (ref 22–32)
Calcium: 8.6 mg/dL — ABNORMAL LOW (ref 8.9–10.3)
Chloride: 103 mmol/L (ref 101–111)
Creatinine, Ser: 1.01 mg/dL — ABNORMAL HIGH (ref 0.44–1.00)
GFR calc Af Amer: 58 mL/min — ABNORMAL LOW (ref >60)
GFR, EST NON AFRICAN AMERICAN: 50 mL/min — AB (ref >60)
Glucose, Bld: 145 mg/dL — ABNORMAL HIGH (ref 65–99)
Potassium: 3.8 mmol/L (ref 3.5–5.1)
Sodium: 137 mmol/L (ref 135–145)
Total Protein: 5 g/dL — ABNORMAL LOW (ref 6.5–8.1)

## 2014-12-19 LAB — URINE MICROSCOPIC-ADD ON

## 2014-12-19 MED ORDER — ACETAMINOPHEN 325 MG PO TABS
650.0000 mg | ORAL_TABLET | Freq: Three times a day (TID) | ORAL | Status: DC
  Administered 2014-12-19 – 2014-12-25 (×24): 650 mg via ORAL
  Filled 2014-12-19 (×24): qty 2
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
## 2014-12-19 NOTE — Progress Notes (Signed)
Speech Language Pathology Daily Session Note  Patient Details  Name: Patrice Matthew MRN: [id number] Date of Birth: 1929-07-08  Today's Date: 12/19/2014 SLP Individual Time: 1500-1530 SLP Individual Time Calculation (min): 30 min  Short Term Goals: Week 1: SLP Short Term Goal 1 (Week 1): Patient will be able to provide direct/concise responses to open-ended questions, with no more than 2 clinician prompts to decrease/cease verbose and tangential resoponses SLP Short Term Goal 2 (Week 1): Patient will perform complex-level medication and money management tasks with 85% accuracy for two consecutive, targeted sessions. SLP Short Term Goal 3 (Week 1): Patient will be able to demonstrate divided attention with minimal clinician cues to achieve 85% accuracy on functional tasks.   Skilled Therapeutic Interventions: Skilled treatment session focused on cognitive goals. SLP facilitated session by providing extra time and supervision verbal cues for recall during a basic money management task, however, patient was Mod I for problem solving with task. Patient was verbose and tangential throughout the session and required Min A verbal and question cues for redirection. Patient left upright in recliner with family present. Continue with current plan of care.    FIM:  Comprehension Comprehension Mode: Auditory Comprehension: 5-Follows basic conversation/direction: With no assist Expression Expression Mode: Verbal Expression: 5-Expresses basic needs/ideas: With no assist Social Interaction Social Interaction: 6-Interacts appropriately with others with medication or extra time (anti-anxiety, antidepressant). Problem Solving Problem Solving: 5-Solves basic problems: With no assist Memory Memory: 5-Recognizes or recalls 90% of the time/requires cueing < 10% of the time  Pain Pain Assessment Pain Assessment: No/denies pain Pain Score: 0-No pain  Therapy/Group: Individual Therapy  Kaydin Labo,  [place] Kinyon 12/19/2014, 3:38 PM

## 2014-12-19 NOTE — Progress Notes (Signed)
Patient information reviewed and entered into eRehab system by Franky Reier, RN, CRRN, PPS Coordinator.  Information including medical coding and functional independence measure will be reviewed and updated through discharge.     Per nursing patient was given "Data Collection Information Summary for Patients in Inpatient Rehabilitation Facilities with attached "Privacy Act Statement-Health Care Records" upon admission.  
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
## 2014-12-19 NOTE — Progress Notes (Signed)
Occupational Therapy Session Note  Patient Details  Name: Sara Lynch MRN: [id number] Date of Birth: [birth year]  Today's Date: 12/19/2014 OT Individual Time: 1300-1400 OT Individual Time Calculation (min): 60 min    Short Term Goals: Week 1:  OT Short Term Goal 1 (Week 1): Pt will complete toileting with mod assist to manage clothing OT Short Term Goal 2 (Week 1): Pt will complete upper body bathing with min assist to manage orthotics (sling, collar) OT Short Term Goal 3 (Week 1): Pt will complete lower body bathing with mod assist OT Short Term Goal 4 (Week 1): Pt will dress lower body with mod assist for socks and shoes & steadying to stand to pull up pants. OT Short Term Goal 5 (Week 1): Pt will complete BUE HEP with supervision  Skilled Therapeutic Interventions/Progress Updates:  Pt seen for 1:1 OT session with focus on functional mobility, LUE ROM/exercise, functional transfers, standing balance, and activity tolerance. Pt received sitting in recliner chair with daughter present. Completed gentle ROM exercises to LUE and pendulum exercises from sitting. Pt reporting no pain or discomfort. Pt ambulated to day room at min A and no AD. Pt watered plants in standing with min A for balance and min cues for use of RUE only. Ambulated to therapy apt and practiced furniture transfers from couch with min-mod A due to fatigue. Completed simulated shower transfer via stepping over 3" ledge with min A. Pt family reporting there is a built in shower chair in shower however unsure of size. Discussed measuring it or taking picture as pt will likely be completing bathing from sit<>stand level at discharge. Pt ambulated back to room with min cues for path finding and left sitting in recliner with all needs in reach.   Therapy Documentation Precautions:  Precautions Precautions: Fall, Cervical (L UE ) Required Braces or Orthoses: Sling, Cervical Brace Cervical Brace: Hard collar, At all  times Restrictions Weight Bearing Restrictions: Yes LUE Weight Bearing: Non weight bearing General: General Chart Reviewed: Yes Family/Caregiver Present: Yes Vital Signs:  Pain: Pain Assessment Pain Assessment: 0-10 Pain Score: 5  Pain Type: Acute pain Pain Location: Neck Pain Descriptors / Indicators: Aching Pain Onset: On-going Pain Intervention(s): Emotional support;Repositioned  See FIM for current functional status  Therapy/Group: Individual Therapy  Duayne Cal 12/19/2014, 2:02 PM
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
## 2014-12-19 NOTE — Progress Notes (Signed)
VASCULAR LAB PRELIMINARY  PRELIMINARY  PRELIMINARY  PRELIMINARY  Bilateral lower extremity venous duplex completed.    Preliminary report:  Bilateral:  No evidence of DVT, superficial thrombosis, or Baker's Cyst.   Ericia Moxley, RVS 12/19/2014, 6:58 PM

## 2014-12-19 NOTE — Progress Notes (Signed)
Patient reported to have taken collar off last night--was hot and disoriented. Safety reinforced. Will check follow up films for stability. Labs with leucocytosis. Will check UA/UCS to rule out UTI as cause of intermittent confusion.  Will have sign placed in room to remind patient to keep collar on.
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
## 2014-12-20 ENCOUNTER — Inpatient Hospital Stay (HOSPITAL_COMMUNITY): Payer: Medicare Other | Admitting: Physical Therapy

## 2014-12-20 ENCOUNTER — Inpatient Hospital Stay (HOSPITAL_COMMUNITY): Payer: Medicare Other | Admitting: Occupational Therapy

## 2014-12-20 DIAGNOSIS — S066X0D Traumatic subarachnoid hemorrhage without loss of consciousness, subsequent encounter: Principal | ICD-10-CM

## 2014-12-20 DIAGNOSIS — S069X0S Unspecified intracranial injury without loss of consciousness, sequela: Secondary | ICD-10-CM

## 2014-12-20 LAB — GLUCOSE, CAPILLARY
GLUCOSE-CAPILLARY: 130 mg/dL — AB (ref 65–99)
Glucose-Capillary: 127 mg/dL — ABNORMAL HIGH (ref 65–99)
Glucose-Capillary: 119 mg/dL — ABNORMAL HIGH (ref 65–99)
Glucose-Capillary: 162 mg/dL — ABNORMAL HIGH (ref 65–99)

## 2014-12-20 MED ORDER — FLEET ENEMA 7-19 GM/118ML RE ENEM
1.0000 | ENEMA | Freq: Once | RECTAL | Status: AC
  Administered 2014-12-20: 1 via RECTAL
  Filled 2014-12-20: qty 1

## 2014-12-20 NOTE — Progress Notes (Signed)
Occupational Therapy Session Note  Patient Details  Name: Sara Lynch MRN: [id number] Date of Birth: 06-04-1929  Today's Date: 12/20/2014 OT Individual Time:  -   1100-1210  (70 min)      Short Term Goals: Week 1:  OT Short Term Goal 1 (Week 1): Pt will complete toileting with mod assist to manage clothing OT Short Term Goal 2 (Week 1): Pt will complete upper body bathing with min assist to manage orthotics (sling, collar) OT Short Term Goal 3 (Week 1): Pt will complete lower body bathing with mod assist OT Short Term Goal 4 (Week 1): Pt will dress lower body with mod assist for socks and shoes & steadying to stand to pull up pants. OT Short Term Goal 5 (Week 1): Pt will complete BUE HEP with supervision   Skilled OT intervention for bed mobility, functional ambulation in room, sit to stand (hx of polymyositis (had difficulty getting up from low surfaces PTA)  transfers, dynamic balance, activity tolerance, all functional mobility, recall/memory, maintaining precautions.  Pt lying in bed but easily aroused.  Pt. Went from supine to EOB to standing with SBA.  Ambulated to bathroom with minimal assist and sat on 3n1 BSC with SBA for increased height.  Pt. Urinated, but unable to move bowels.  Ambulated to sink to perform bathing. Required max assist to go from sit to stand in wc due to low surface and no cushion.   Needed max assist with UB clothes and sling.  Daughter arrived near end of session and reported that pt was suppose to get showered on Sunday.  No OT scheduled for Sunday.  Found the extra pads for neck collar so pt can shower with later OT session.  Pt. Complained of burning pain in left shoulder and applied light myotherapy.  Educated pt on upright posture when sitting and shoulder alignment.  Pt.verbalized and demonstrated understanding.  Pt. Left in wc with all needs in reach and daughter in room.   Therapy Documentation Precautions:  Precautions Precautions: Fall, Cervical (L UE  ) Required Braces or Orthoses: Sling, Cervical Brace Cervical Brace: Hard collar, At all times Restrictions Weight Bearing Restrictions: Yes LUE Weight Bearing: Non weight bearing      Pain:  5/10  Left shoulder and upper traps      See FIM for current functional status  Therapy/Group: Individual Therapy  Lisa Roca 12/20/2014, 8:32 AM

## 2014-12-20 NOTE — Progress Notes (Signed)
Occupational Therapy Session Note  Patient Details  Name: Sara Lynch MRN: [id number] Date of Birth: 26-Feb-1929  Today's Date: 12/20/2014 OT Individual Time: 1445-1530 OT Individual Time Calculation (min): 45 min    Short Term Goals: Week 1:  OT Short Term Goal 1 (Week 1): Pt will complete toileting with mod assist to manage clothing OT Short Term Goal 2 (Week 1): Pt will complete upper body bathing with min assist to manage orthotics (sling, collar) OT Short Term Goal 3 (Week 1): Pt will complete lower body bathing with mod assist OT Short Term Goal 4 (Week 1): Pt will dress lower body with mod assist for socks and shoes & steadying to stand to pull up pants. OT Short Term Goal 5 (Week 1): Pt will complete BUE HEP with supervision  Skilled Therapeutic Interventions/Progress Updates:    Pt seen for OT ADL bathing and dressing session. Pt in supine upon arrival, voicing desire to complete full showering task. Pt transferred to EOB with min A and ambulated into bathroom with hand held assist. Pt completed toileting task with steadying assist and doffed clothing seated on toilet in prep for showering task. Pt ambulated into shower with HHA. She declined full body bathing and just washed hair with assist for thoroughness. Following shower, pt returned to supine for cervical brace pads to be changed. Pt then sat EOB to complete dressing task with assist to thread L UE into shirt due to ROM restrictions/ limitations. Pt stood with steadying assist to pull pants up. Pt ambulated to sink with HHA and sat in front of sink to complete grooming tasks. Pt left sitting in front of sink completing grooming tasks at end of session, pt voiced understanding not to get up unassisted, NT to be in room shortly to assist with back to bed, NT made aware.  Pt educated regarding cervical precautions, fall risk, energy conservation, and d/Lynch planning.   Therapy Documentation Precautions:  Precautions Precautions:  Fall, Cervical (L UE ) Required Braces or Orthoses: Sling, Cervical Brace Cervical Brace: Hard collar, At all times Restrictions Weight Bearing Restrictions: Yes LUE Weight Bearing: Non weight bearing Pain: Pain Assessment Pain Assessment: No/denies pain Pain Score: 0-No pain  See FIM for current functional status  Therapy/Group: Individual Therapy  Sara Lynch, Sara Lynch 12/20/2014, 12:45 PM

## 2014-12-20 NOTE — Progress Notes (Signed)
Physical Therapy Session Note  Patient Details  Name: Sara Lynch MRN: [id number] Date of Birth: 1929/01/04  Today's Date: 12/20/2014 PT Individual Time: 1300-1330 30 min concurrent treatment 1330-1345 15 min individual treatment 1345-1415 30 min concurrent treatment    Therapy Documentation Precautions:  Precautions Precautions: Fall, Cervical (L UE ) Required Braces or Orthoses: Sling, Cervical Brace Cervical Brace: Hard collar, At all times Restrictions Weight Bearing Restrictions: Yes LUE Weight Bearing: Non weight bearing  Pain: Pain Assessment Pain Assessment: No/denies pain Pain Score: 0-No pain Faces Pain Scale: No hurt PAINAD (Pain Assessment in Advanced Dementia) Breathing: normal  Patient performed all transfers with close supervision and verbal cues for hand placement and controlled descent with the exception from a low surface min assist was required.   Patient ambulated 200 feetx2 without assistive device close supervision. Patient ambulated with varying cadence and step length and increased postural sway. Verbal cues required for step length environmental awareness, topographical orientation, and obstacle negotiation. No overt LOB.   10 meter walk test performed  0.62 m/s average speed of two trials indicating patient is a limited community ambulator.   Four square step test performed three trials: one failed trial, 20.2 seconds, and 19.3 seconds indicating patient is at risk for multiple falls.   Patient negotiated 12 steps right upper extremity support on handrail contact guard assist. Verbal cues for pacing sequerncing and hand placement on handrail.   Lateral stepping 25 feet to the left and right contact guard assist.  B heel raises in standing 30x  Short sit to supine on mat close supervision  Supine to short sit on mat mod assist   Patient tolerated treatment well with rest breaks throughout session. Vitals monitored and remained stable throughout  session and responded appropriately with activity left upper extremity sling and aspen collar donned throughout entire session; spinal precautions maintained. Patient resting comfortably in bed at end of session with all needs met and bed alarm engaged.  See FIM for current functional status.  Therapy/Group: Individual 15 min; concurrent 60 min   Kerrilynn Derenzo J 12/20/2014, 5:00 PM
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
## 2014-12-21 ENCOUNTER — Inpatient Hospital Stay (HOSPITAL_COMMUNITY): Payer: Medicare Other | Admitting: Physical Therapy

## 2014-12-21 DIAGNOSIS — S066X0S Traumatic subarachnoid hemorrhage without loss of consciousness, sequela: Secondary | ICD-10-CM

## 2014-12-21 LAB — TYPE AND SCREEN
ABO/RH(D): B POS
ANTIBODY SCREEN: NEGATIVE
UNIT DIVISION: 0
UNIT DIVISION: 0

## 2014-12-21 LAB — GLUCOSE, CAPILLARY
GLUCOSE-CAPILLARY: 150 mg/dL — AB (ref 65–99)
Glucose-Capillary: 143 mg/dL — ABNORMAL HIGH (ref 65–99)
Glucose-Capillary: 140 mg/dL — ABNORMAL HIGH (ref 65–99)
Glucose-Capillary: 119 mg/dL — ABNORMAL HIGH (ref 65–99)
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
## 2014-12-21 NOTE — Progress Notes (Signed)
Physical Therapy Note  Patient Details  Name: Maanya Hippert MRN: [id number] Date of Birth: 01/01/1929 Today's Date: 12/21/2014    Time: [phone] 30 minutes  1:1 No c/o pain. Pt gait with HHA, min A 150' x 2 in controlled environment, then 150' in home environment with obstacle negotiation, no LOB.  Bed mobility practiced with pt improving with rolling to side and pushing up with R UE. Pt able to return demonstrate bed mobility multiple attempts. Pt with good motivation throughout session.   Jama Krichbaum 12/21/2014, 11:11 AM

## 2014-12-22 ENCOUNTER — Inpatient Hospital Stay (HOSPITAL_COMMUNITY): Payer: Medicare Other | Admitting: Physical Therapy

## 2014-12-22 ENCOUNTER — Inpatient Hospital Stay (HOSPITAL_COMMUNITY): Payer: Medicare Other | Admitting: Speech Pathology

## 2014-12-22 ENCOUNTER — Inpatient Hospital Stay (HOSPITAL_COMMUNITY): Payer: Medicare Other

## 2014-12-22 ENCOUNTER — Inpatient Hospital Stay (HOSPITAL_COMMUNITY): Payer: Medicare Other | Admitting: Occupational Therapy

## 2014-12-22 LAB — URINE CULTURE

## 2014-12-22 LAB — GLUCOSE, CAPILLARY
GLUCOSE-CAPILLARY: 147 mg/dL — AB (ref 65–99)
Glucose-Capillary: 154 mg/dL — ABNORMAL HIGH (ref 65–99)
Glucose-Capillary: 82 mg/dL (ref 65–99)
Glucose-Capillary: 157 mg/dL — ABNORMAL HIGH (ref 65–99)
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
## 2014-12-22 NOTE — Progress Notes (Addendum)
Physical Therapy Session Note  Patient Details  Name: Sara Lynch MRN: [id number] Date of Birth: 1929-04-09  Today's Date: 12/22/2014 PT Individual Time: 1105-1205 PT Individual Time Calculation (min): 60 min   Short Term Goals: Week 1:  PT Short Term Goal 1 (Week 1): = LTGs of overall supervision  Skilled Therapeutic Interventions/Progress Updates:   Session focused on BLE strengthening and muscular endurance, functional mobility training, and standing tolerance. Patient sitting in wheelchair, donned shoes with total A due to time constraints. Gait without device up to 200 ft multiple times throughout rehab unit with close supervision-min guard. Patient instructed in 5x sit <> stand test using RUE = 35 sec. Performed car transfer to sedan height without device with close supervision, min cues for technique. Discussed discharge planning and goals for therapies as patient voicing feeling ready to go home. BLE therex for strengthening/muscular endurance with R HHA for standing exercises: LAQ x 20 each LE, seated alt marching x 70, standing heel raises 2 x 10, standing knee flexion 2 x 10 each LE, squats x 10 with focus on technique. Patient performed bed mobility in ADL apartment on regular bed to R side with supervision. Stair training up/down twelve 6" steps using R rail with close supervision. Patient participated in game of checkers at high table, able to maintain standing x 15 min unsupported. Arm sling adjusted for comfort and fit throughout session. Patient left sitting in wheelchair with needs within reach and daughter present.   Five times Sit to Stand Test (FTSS) Method: Use a straight back chair with a solid seat that is 16-18" high. Ask participant to sit on the chair with arms folded across their chest.   Instructions: "Stand up and sit down as quickly as possible 5 times, keeping your arms folded across your chest."   Measurement: Stop timing when the participant stands the 5th  time.  TIME: ___35 with RUE support___ (in seconds)  Times > 13.6 seconds is associated with increased disability and morbidity (Guralnik, 2000) Times > 15 seconds is predictive of recurrent falls in healthy individuals aged 65 and older (Buatois, et al., 2008) Normal performance values in community dwelling individuals aged 50 and older (Bohannon, 2006): o 60-69 years: 11.4 seconds o 70-79 years: 12.6 seconds o 80-89 years: 14.8 seconds  MCID: ? 2.3 seconds for Vestibular Disorders Mariah Milling, 2006)  Therapy Documentation Precautions:  Precautions Precautions: Fall, Cervical (L UE ) Required Braces or Orthoses: Sling, Cervical Brace Cervical Brace: Hard collar, At all times Restrictions Weight Bearing Restrictions: Yes LUE Weight Bearing: Non weight bearing Pain: Pain Assessment Pain Assessment: 0-10 Pain Score: 4  Pain Type: Acute pain Pain Location: Shoulder Pain Orientation: Left;Proximal Pain Descriptors / Indicators: Aching Pain Onset: On-going Pain Intervention(s): Repositioned;Rest  See FIM for current functional status  Therapy/Group: Individual Therapy  Laretta Alstrom 12/22/2014, 12:16 PM
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
## 2014-12-22 NOTE — Progress Notes (Signed)
Occupational Therapy Session Note  Patient Details  Name: Sara Lynch MRN: [id number] Date of Birth: [birth year]  Today's Date: 12/22/2014 OT Individual Time: 0800-0900 OT Individual Time Calculation (min): 60 min    Short Term Goals: Week 1:  OT Short Term Goal 1 (Week 1): Pt will complete toileting with mod assist to manage clothing OT Short Term Goal 2 (Week 1): Pt will complete upper body bathing with min assist to manage orthotics (sling, collar) OT Short Term Goal 3 (Week 1): Pt will complete lower body bathing with mod assist OT Short Term Goal 4 (Week 1): Pt will dress lower body with mod assist for socks and shoes & steadying to stand to pull up pants. OT Short Term Goal 5 (Week 1): Pt will complete BUE HEP with supervision  Skilled Therapeutic Interventions/Progress Updates:  Pt seen for ADL retraining with focus on functional transfers, standing balance, activity tolerance, and compensatory strategies for UB self-care. Pt received supine in bed. Ambulated bed>bathroom with min A to complete toileting at overall steadying assist for clothing management. Completed bathing sit<>stand at sink as pt declined shower this AM. Practiced with adaptive techniques for washing RUE with pt completing 75%. Pt required mod A overall with UB dressing for pullover shirt. Pt required steadying assist for standing balance during LB self-care tasks. Pt left sitting in w/c with all needs in reach.   Therapy Documentation Precautions:  Precautions Precautions: Fall, Cervical (L UE ) Required Braces or Orthoses: Sling, Cervical Brace Cervical Brace: Hard collar, At all times Restrictions Weight Bearing Restrictions: Yes LUE Weight Bearing: Non weight bearing General:   Vital Signs: Therapy Vitals Pulse Rate: 87 BP: (!) 149/67 mmHg Pain: Pain Assessment Pain Assessment: 0-10 Pain Score: 4  Pain Type: Acute pain Pain Location: Shoulder Pain Orientation: Left;Proximal Pain Descriptors /  Indicators: Aching Pain Onset: On-going Pain Intervention(s): Repositioned;Rest  See FIM for current functional status  Therapy/Group: Individual Therapy  Duayne Cal 12/22/2014, 12:13 PM

## 2014-12-22 NOTE — Progress Notes (Signed)
Speech Language Pathology Daily Session Note  Patient Details  Name: Sara Lynch MRN: [id number] Date of Birth: 1929-04-12  Today's Date: 12/22/2014 SLP Individual Time: 0900-1000 SLP Individual Time Calculation (min): 60 min  Short Term Goals: Week 1: SLP Short Term Goal 1 (Week 1): Patient will be able to provide direct/concise responses to open-ended questions, with no more than 2 clinician prompts to decrease/cease verbose and tangential resoponses SLP Short Term Goal 2 (Week 1): Patient will perform complex-level medication and money management tasks with 85% accuracy for two consecutive, targeted sessions. SLP Short Term Goal 3 (Week 1): Patient will be able to demonstrate divided attention with minimal clinician cues to achieve 85% accuracy on functional tasks.   Skilled Therapeutic Interventions: Skilled treatment session focused on cognitive goals. SLP facilitated session by administering the MoCA. Patient scored an 18/22 with a score of 18 or greater considered normal. Patient demonstrated difficulty with recall of new information throughout the assessment but demonstrated appropriate emergent awareness of this with Mod I. SLP also facilitated session with a mildly complex, new learning task with focus on use of memory compensatory strategies. Patient utilized Investment banker, operational with Min A verbal and question cues and recalled the rules of task with supervision verbal cues. Patient left in wheelchair with all needs within reach. Continue with current plan of care.    FIM:  Comprehension Comprehension Mode: Auditory Comprehension: 6-Follows complex conversation/direction: With extra time/assistive device Expression Expression Mode: Verbal Expression: 5-Expresses complex 90% of the time/cues < 10% of the time Social Interaction Social Interaction: 6-Interacts appropriately with others with medication or extra time (anti-anxiety, antidepressant). Problem Solving Problem Solving:  5-Solves basic problems: With no assist Memory Memory: 4-Recognizes or recalls 75 - 89% of the time/requires cueing 10 - 24% of the time  Pain Pain Assessment Pain Assessment: 0-10 Pain Score: 4  Pain Type: Acute pain Pain Location: Shoulder Pain Orientation: Left;Proximal Pain Descriptors / Indicators: Aching Pain Onset: On-going Pain Intervention(s): Repositioned;Rest  Therapy/Group: Individual Therapy  Daleigh Pollinger 12/22/2014, 12:25 PM

## 2014-12-22 NOTE — Progress Notes (Signed)
Occupational Therapy Session Note  Patient Details  Name: Sara Lynch MRN: [id number] Date of Birth: May 18, 1929  Today's Date: 12/22/2014 OT Individual Time: 1500-1600 OT Individual Time Calculation (min): 60 min    Short Term Goals: Week 1:  OT Short Term Goal 1 (Week 1): Pt will complete toileting with mod assist to manage clothing OT Short Term Goal 2 (Week 1): Pt will complete upper body bathing with min assist to manage orthotics (sling, collar) OT Short Term Goal 3 (Week 1): Pt will complete lower body bathing with mod assist OT Short Term Goal 4 (Week 1): Pt will dress lower body with mod assist for socks and shoes & steadying to stand to pull up pants. OT Short Term Goal 5 (Week 1): Pt will complete BUE HEP with supervision  Skilled Therapeutic Interventions/Progress Updates:    Pt seen for 1:1 OT session focusing on functional mobility, functional transfers, dynamic standing balance, activity tolerance, and LUE ROM. Pt received supine in bed. Pt supine<sit<stand with supervision level. Pt requested to toilet and completed toileting with min A for steadying support while pulling up pants. Pt completed pendulum exercises sitting in w/c. Pt completed 3x of 3 pendulum exercises with no report of discomfort. Pt then ambulated to day room with min A from therapist, remaining in standing for apprx 15 minutes before requiring rest break. Pt completed dynamic standing balance activity with min A. Pt then ambulated apprx. 64' to ADL apt to complete sit<>stand from low couch. Pt required min-mod A due to low surface and pt fatigue. Pt then ambulated back to room. Pt left sitting in w/c with all needs within reach.   Therapy Documentation Precautions:  Precautions Precautions: Fall, Cervical (L UE ) Required Braces or Orthoses: Sling, Cervical Brace Cervical Brace: Hard collar, At all times Restrictions Weight Bearing Restrictions: Yes LUE Weight Bearing: Non weight bearing General:    Vital Signs: Therapy Vitals Temp: 98.9 F (37.2 C) Temp Source: Oral Pulse Rate: 78 Resp: 17 BP: (!) 114/55 mmHg Patient Position (if appropriate): Sitting Oxygen Therapy SpO2: 97 % O2 Device: Not Delivered Pain: Pain Assessment Pain Assessment: 0-10 Pain Score: 2   See FIM for current functional status  Therapy/Group: Individual Therapy  Dorann Ou 12/22/2014, 4:09 PM

## 2014-12-23 ENCOUNTER — Inpatient Hospital Stay (HOSPITAL_COMMUNITY): Payer: Medicare Other | Admitting: Physical Therapy

## 2014-12-23 ENCOUNTER — Inpatient Hospital Stay (HOSPITAL_COMMUNITY): Payer: Medicare Other | Admitting: Speech Pathology

## 2014-12-23 ENCOUNTER — Inpatient Hospital Stay (HOSPITAL_COMMUNITY): Payer: Medicare Other

## 2014-12-23 ENCOUNTER — Ambulatory Visit (HOSPITAL_COMMUNITY): Payer: Medicare Other | Admitting: *Deleted

## 2014-12-23 LAB — GLUCOSE, CAPILLARY
GLUCOSE-CAPILLARY: 116 mg/dL — AB (ref 65–99)
Glucose-Capillary: 128 mg/dL — ABNORMAL HIGH (ref 65–99)
Glucose-Capillary: 115 mg/dL — ABNORMAL HIGH (ref 65–99)
Glucose-Capillary: 118 mg/dL — ABNORMAL HIGH (ref 65–99)

## 2014-12-23 NOTE — Care Management Note (Signed)
[facility] Individual Statement of Services  Patient Name:  Sara Lynch  Date:  12/22/2014  Welcome to the [facility].  Our goal is to provide you with an individualized program based on your diagnosis and situation, designed to meet your specific needs.  With this comprehensive rehabilitation program, you will be expected to participate in at least 3 hours of rehabilitation therapies Monday-Friday, with modified therapy programming on the weekends.  Your rehabilitation program will include the following services:  Physical Therapy (PT), Occupational Therapy (OT), Speech Therapy (ST), 24 hour per day rehabilitation nursing, Therapeutic Recreaction (TR), Neuropsychology, Case Management (Social Worker), Rehabilitation Medicine, Nutrition Services and Pharmacy Services  Weekly team conferences will be held on Tuesdays to discuss your progress.  Your Social Worker will talk with you frequently to get your input and to update you on team discussions.  Team conferences with you and your family in attendance may also be held.  Expected length of stay: 10-14 days  Overall anticipated outcome: supervision/ min assist  Depending on your progress and recovery, your program may change. Your Social Worker will coordinate services and will keep you informed of any changes. Your Social Worker's name and contact numbers are listed  below.  The following services may also be recommended but are not provided by the [facility]:    [facility] will be made to provide these services after discharge if needed.  Arrangements include referral to agencies that provide these services.  Your insurance has been verified to be:  Medicare and [place] Your primary doctor is:  Nicky Pugh  Pertinent information will be shared with your doctor and your insurance company.  Social  Worker:  [place], [place] or ([phone]   Information discussed with and copy given to patient by: Lennart Pall, 12/22/2014, 10:30 AM

## 2014-12-23 NOTE — Progress Notes (Signed)
Physical Therapy Session Note  Patient Details  Name: Sara Lynch MRN: [id number] Date of Birth: 05/09/1929  Today's Date: 12/23/2014 PT Individual Time: 0900-1000 and 1400-1442 PT Individual Time Calculation (min): 60 min and 42 min  Short Term Goals: Week 1:  PT Short Term Goal 1 (Week 1): = LTGs of overall supervision  Skilled Therapeutic Interventions/Progress Updates:   Treatment 1: Session focused on functional mobility training, strengthening, endurance, and balance. Patient sitting in wheelchair, requesting to use bathroom. Patient performed toileting tasks and hand hygiene with supervision. Patient ambulated throughout rehab unit 150 ft x 4 without device with close supervision and one episode of stubbing L toe but recovered independently.   Patient noted to be able to move head in all directions with hard collar donned. Returned to room and patient performed bed mobility with supervision and HOB elevated 30 deg while therapist and RN adjusted cervical collar. Patient with improved fit and reported decreased upper back/shoulder pain following adjustment. Patient required multiple adjustments during session to LUE sling for improved fit and comfort as well.    Standing BLE therex with RUE support on back of chair: alt marching 2 x 20, knee flexion x 20 each LE, heel raises x 20, hip abduction x 10 each LE and seated LAQ with 3# wt x 20 each LE. Patient returned to room and left semi reclined in bed with pillows to support upper back and LUE for comfort, all needs within reach.   Treatment 2: Session focused on community ambulation and BLE NMR. Patient propelled wheelchair using BLE in controlled environment, 2 x 300 ft with supervision. Patient negotiated through narrow spaces in carpeted gift shop with supervision-min guard with one minor LOB with patient recovering with min guard. Patient performed sit <> stand from variety of surfaces including armless chair and outdoor bench with min  A. Patient performed outdoor ambulation on concrete and brick surfaces, inclines/declines, uneven surfaces, and over thresholds without device with supervision-min guard. Patient left sitting in wheelchair with all needs within reach.   Therapy Documentation Precautions:  Precautions Precautions: Fall, Cervical (L UE ) Required Braces or Orthoses: Sling, Cervical Brace Cervical Brace: Hard collar, At all times Restrictions Weight Bearing Restrictions: Yes LUE Weight Bearing: Non weight bearing Pain: Pain Assessment Pain Assessment: 0-10 Pain Score: 6  Pain Type: Acute pain Pain Location: Shoulder Pain Orientation: Proximal;Upper;Left Pain Descriptors / Indicators: Aching Pain Onset: On-going Pain Intervention(s): Repositioned;Rest  See FIM for current functional status  Therapy/Group: Individual Therapy  Laretta Alstrom 12/23/2014, 9:52 AM

## 2014-12-23 NOTE — Progress Notes (Signed)
Speech Language Pathology Daily Session Note  Patient Details  Name: Sara Lynch MRN: [id number] Date of Birth: 01/27/1929  Today's Date: 12/23/2014 SLP Individual Time: 1105-1210 SLP Individual Time Calculation (min): 65 min  Short Term Goals: Week 1: SLP Short Term Goal 1 (Week 1): Patient will be able to provide direct/concise responses to open-ended questions, with no more than 2 clinician prompts to decrease/cease verbose and tangential resoponses SLP Short Term Goal 2 (Week 1): Patient will perform complex-level medication and money management tasks with 85% accuracy for two consecutive, targeted sessions. SLP Short Term Goal 3 (Week 1): Patient will be able to demonstrate divided attention with minimal clinician cues to achieve 85% accuracy on functional tasks.   Skilled Therapeutic Interventions: Skilled treatment session focused on cognitive goals. Upon arrival, patient was supine in bed and agreeable to participate in treatment session. Patient transferred to wheelchair with extra time and overall supervision. SLP facilitated session by providing supervision verbal cues for problem solving, recall and organization during a mildly complex, new learning task. Patient also participated in a meal planning task with supervision verbal cues to locate items within the menu. Patient recalled events from morning therapy sessions and completed basic self-care tasks with Mod I. Patient left in wheelchair with all needs within reach. Continue with current plan of care.    FIM:  Comprehension Comprehension Mode: Auditory Comprehension: 6-Follows complex conversation/direction: With extra time/assistive device Expression Expression Mode: Verbal Social Interaction Social Interaction: 6-Interacts appropriately with others with medication or extra time (anti-anxiety, antidepressant). Problem Solving Problem Solving: 5-Solves complex 90% of the time/cues < 10% of the time Memory Memory:  5-Requires cues to use assistive device  Pain Pain Assessment Pain Assessment: 0-10 Pain Score: 3   Therapy/Group: Individual Therapy  Adolphe Fortunato 12/23/2014, 4:28 PM
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
## 2014-12-23 NOTE — Progress Notes (Signed)
Occupational Therapy Session Note  Patient Details  Name: Sara Lynch MRN: [id number] Date of Birth: [birth year]  Today's Date: 12/23/2014 OT Individual Time: 0700-0800 and 1300-1345 OT Individual Time Calculation (min): 60 min and 45 min   Short Term Goals: Week 1:  OT Short Term Goal 1 (Week 1): Pt will complete toileting with mod assist to manage clothing OT Short Term Goal 2 (Week 1): Pt will complete upper body bathing with min assist to manage orthotics (sling, collar) OT Short Term Goal 3 (Week 1): Pt will complete lower body bathing with mod assist OT Short Term Goal 4 (Week 1): Pt will dress lower body with mod assist for socks and shoes & steadying to stand to pull up pants. OT Short Term Goal 5 (Week 1): Pt will complete BUE HEP with supervision  Skilled Therapeutic Interventions/Progress Updates:    Session 1: Pt seen for ADL retraining session with a focus on functional transfers, standing balance, compensatory strategies for UB dressing, activity tolerance, and safety awareness. Pt received supine in bed. Pt ambulated bed>sink with steadying assist. Pt completed bathing routine sit<>stand at sink level as pt stated that she did not care to shower today. Pt able to complete B/D with min-mod A, requiring mod A with UB dressing assist. Therapist discussed adaptive dressing strategies for UB. Pt required steadying assist overall for balance during LB dressing, and min A for clothing management on L side. Pt required min cues for body posture during sit<>stand but able to correct with cues. Therapist provided shoe button to enable ease with shoe tying. Pt left sitting in w/c with breakfast and RN present. All pt needs within reach.   Session 2: Pt seen for 1:1 OT session with a focus on functional mobility, functional transfers, activity tolerance, LUE ROM, safety awareness, and dynamic standing balance. Pt received in room requesting to toilet. Pt ambulated to bathroom and performed  toileting at SBA level. Pt then completed 3 sets of 3 pendulum exercises with report of relief while completing exercise. Pt able to recall how to complete all exercises independently.Therapist then provided activity with theraband to simulate waistband during LB clothing management. Pt performed activity with SBA 2x. Pt then ambulated apprx 14' to ADL apt and performed dynamic standing activity 2x requiring pt to reach out of BOS. Pt then completed 5x sit<>stand from low couch with steadying assist-min A. Noted fatigue as task progressed. Pt ambulated with SBA apprx 150' back to room. Pt left seated in w/c with all needs in reach.   Therapy Documentation Precautions:  Precautions Precautions: Fall, Cervical (L UE ) Required Braces or Orthoses: Sling, Cervical Brace Cervical Brace: Hard collar, At all times Restrictions Weight Bearing Restrictions: Yes LUE Weight Bearing: Non weight bearing General:   Vital Signs: Therapy Vitals Pulse Rate: 83 BP: (!) 149/67 mmHg Pain: Pain Assessment Pain Assessment: 0-10 Pain Score: 6  Pain Type: Acute pain Pain Location: Shoulder Pain Orientation: Proximal;Upper;Left Pain Descriptors / Indicators: Aching Pain Onset: On-going Pain Intervention(s): Repositioned;Rest ADL:   Exercises:   Other Treatments:    See FIM for current functional status  Therapy/Group: Individual Therapy  Dorann Ou 12/23/2014, 11:04 AM

## 2014-12-24 ENCOUNTER — Inpatient Hospital Stay (HOSPITAL_COMMUNITY): Payer: Medicare Other | Admitting: Speech Pathology

## 2014-12-24 ENCOUNTER — Inpatient Hospital Stay (HOSPITAL_COMMUNITY): Payer: Medicare Other

## 2014-12-24 ENCOUNTER — Inpatient Hospital Stay (HOSPITAL_COMMUNITY): Payer: Medicare Other | Admitting: Physical Therapy

## 2014-12-24 DIAGNOSIS — S066X3S Traumatic subarachnoid hemorrhage with loss of consciousness of 1 hour to 5 hours 59 minutes, sequela: Secondary | ICD-10-CM

## 2014-12-24 LAB — GLUCOSE, CAPILLARY
Glucose-Capillary: 102 mg/dL — ABNORMAL HIGH (ref 65–99)
Glucose-Capillary: 114 mg/dL — ABNORMAL HIGH (ref 65–99)
Glucose-Capillary: 116 mg/dL — ABNORMAL HIGH (ref 65–99)
Glucose-Capillary: 108 mg/dL — ABNORMAL HIGH (ref 65–99)

## 2014-12-24 MED ORDER — PHENOL 1.4 % MT LIQD
1.0000 | OROMUCOSAL | Status: DC | PRN
  Filled 2014-12-24: qty 177

## 2014-12-24 NOTE — Progress Notes (Signed)
Occupational Therapy Session Note  Patient Details  Name: Sara Lynch MRN: [id number] Date of Birth: 12-27-1928  Today's Date: 12/24/2014 OT Individual Time: 0700-0800 and 1505-1550 OT Individual Time Calculation (min): 60 min and 45 min    Short Term Goals: Week 1:  OT Short Term Goal 1 (Week 1): Pt will complete toileting with mod assist to manage clothing OT Short Term Goal 2 (Week 1): Pt will complete upper body bathing with min assist to manage orthotics (sling, collar) OT Short Term Goal 3 (Week 1): Pt will complete lower body bathing with mod assist OT Short Term Goal 4 (Week 1): Pt will dress lower body with mod assist for socks and shoes & steadying to stand to pull up pants. OT Short Term Goal 5 (Week 1): Pt will complete BUE HEP with supervision  Skilled Therapeutic Interventions/Progress Updates:    Session 1: Pt seen for ADL retraining session with a focus on activity tolerance, functional transfers, standing balance, dressing compensatory strategies, safety awareness, and patient education. Pt received supine in bed. Pt agreeable to complete B/D at sink level. Pt ambulated bed>sink with supervision and completed bathing/dressing with supervision for sit<>stand. Pt completed UB dressing at min A level for pulling head through shirt, with mod cues for problem solving and carryover of compensatory dressing strategies. Pt able to complete clothing management in standing with supervision level. Pt ambulated to bed for cervical collar adjustment. Therapist adjusted collar for pt comfort and appropriate position in supine. Pt then ambulated bed>w/c at supervision level with increased time. Pt left sitting in w/c with breakfast and all other needs within reach.   Session 2: Pt seen for 1:1 OT session with a focus on functional mobility, functional transfers, LUE ROM, dynamic standing balance, safety awareness, patient education, and activity tolerance. Pt received sitting in w/c with fatigue  noted. Pt able to recall and complete 3 sets of pendulum exercises independently. Therapist then facilitated scavenger hunt activity in  downstairs gift shop. Activity required pt to ambulate in tight space and locate 5 items with written list. Pt completed sit<>stand from w/c level and functional ambulation at supervision throughout shop. Noted mild difficulty with maneuvering tight corners likely due to decreased neck ROM from cervical collar. Therapist discussed compensatory strategies for visual scanning. Pt them ambulated apprx. 100' on uneven surface outside and sit<>stand from low bench at supervision level. Pt returned to room and demonstrated appropriate direction of care to don/doff sling. Pt also demonstrated verbal understanding of compensatory UB dressing techniques. Pt left seated in w/c with all needs within reach.   Therapy Documentation Precautions:  Precautions Precautions: Fall, Cervical (L UE ) Required Braces or Orthoses: Sling, Cervical Brace Cervical Brace: Hard collar, At all times Restrictions Weight Bearing Restrictions: Yes LUE Weight Bearing: Non weight bearing General:   Vital Signs:  Pain: Pain Assessment Pain Score: Asleep   See FIM for current functional status  Therapy/Group: Individual Therapy  Dorann Ou 12/24/2014, 9:00 AM

## 2014-12-24 NOTE — Progress Notes (Signed)
Speech Language Pathology Daily Session Note  Patient Details  Name: Sara Lynch MRN: [id number] Date of Birth: 1929/06/18  Today's Date: 12/24/2014 SLP Concurrent Time: 1100-1200 SLP Concurrent Time Calculation (min): 60 min   Short Term Goals: Week 1: SLP Short Term Goal 1 (Week 1): Patient will be able to provide direct/concise responses to open-ended questions, with no more than 2 clinician prompts to decrease/cease verbose and tangential resoponses SLP Short Term Goal 2 (Week 1): Patient will perform complex-level medication and money management tasks with 85% accuracy for two consecutive, targeted sessions. SLP Short Term Goal 3 (Week 1): Patient will be able to demonstrate divided attention with minimal clinician cues to achieve 85% accuracy on functional tasks.   Skilled Therapeutic Interventions: Skilled treatment session focused on cognitive goals. Patient participated in a mildly complex, task with focus on use of memory compensatory strategies. Patient utilized memory strategies and recalled the rules of task with Mod I. Patient also recalled events from morning therapy sessions with Mod I. Patient ambulated back to her room and left in wheelchair with all needs within reach. Continue with current plan of care.      FIM:  Comprehension Comprehension Mode: Auditory Comprehension: 6-Follows complex conversation/direction: With extra time/assistive device Expression Expression Mode: Verbal Expression: 5-Expresses complex 90% of the time/cues < 10% of the time Social Interaction Social Interaction: 6-Interacts appropriately with others with medication or extra time (anti-anxiety, antidepressant). Problem Solving Problem Solving: 5-Solves complex 90% of the time/cues < 10% of the time Memory Memory: 6-More than reasonable amt of time  Pain Pain Assessment Pain Assessment: No/denies pain  Therapy/Group: Individual Therapy  Meridian Scherger 12/24/2014, 4:40 PM

## 2014-12-24 NOTE — Plan of Care (Signed)
Problem: RH KNOWLEDGE DEFICIT Goal: RH STG INCREASE KNOWLEDGE OF DYSPHAGIA/FLUID INTAKE Patient will verbalize understanding of dysphagia diet.  Outcome: Not Applicable Date Met:  17/79/39 Pt is on a regular carb mod diet

## 2014-12-24 NOTE — Progress Notes (Signed)
Occupational Therapy Session Note  Patient Details  Name: Sara Lynch MRN: [id number] Date of Birth: 07-24-1929  Today's Date: 12/24/2014 OT Individual Time: 0945-1000 OT Individual Time Calculation (min): 15 min    Short Term Goals: Week 1:  OT Short Term Goal 1 (Week 1): Pt will complete toileting with mod assist to manage clothing OT Short Term Goal 2 (Week 1): Pt will complete upper body bathing with min assist to manage orthotics (sling, collar) OT Short Term Goal 3 (Week 1): Pt will complete lower body bathing with mod assist OT Short Term Goal 4 (Week 1): Pt will dress lower body with mod assist for socks and shoes & steadying to stand to pull up pants. OT Short Term Goal 5 (Week 1): Pt will complete BUE HEP with supervision  Skilled Therapeutic Interventions/Progress Updates:   Pt seen for 1:1 OT session with focus on family education/training on changing pads of cervical collar and don/doffing sling. Pt received sitting in w/c with daughter [name] present. Therapist provided verbal education and demonstration on donning/doffing LUE sling and wearing schedule. Therapist demonstrated technique of changing cervical collar pads in supine position following shower. Further education and hands on training scheduled for tomorrow to reinforce education. Pt left sitting in w/c with all needs in reach.    Therapy Documentation Precautions:  Precautions Precautions: Cervical Required Braces or Orthoses: Sling, Cervical Brace (LUE sling) Cervical Brace: Hard collar, At all times Restrictions Weight Bearing Restrictions: Yes LUE Weight Bearing: Non weight bearing General:   Vital Signs: Therapy Vitals Pulse Rate: 81 BP: 114/86 mmHg Pain: Pain Assessment Pain Assessment: No/denies pain  See FIM for current functional status  Therapy/Group: Individual Therapy  Estoria Geary, Quillian Quince 12/24/2014, 1:05 PM

## 2014-12-24 NOTE — Progress Notes (Signed)
Physical Therapy Discharge Summary  Patient Details  Name: Sara Lynch MRN: [id number] Date of Birth: [birth year]  Today's Date: 12/23/77 PT Individual Time: 1100-1157 and 1430-1500 PT Individual Time Calculation (min): 57 min and 30 min     Patient has met 10 of 10 long term goals due to improved activity tolerance, improved balance, improved postural control, increased strength, decreased pain, ability to compensate for deficits, improved awareness and improved coordination.  Patient to discharge at an ambulatory level Supervision.   Patient's care partner is independent to provide the necessary physical assistance at discharge.  Reasons goals not met: NA  Recommendation:  Patient will benefit from ongoing skilled PT services in home health setting to continue to advance safe functional mobility, address ongoing impairments in strength, activity tolerance, muscular endurance, standing balance, adhering to precautions, and minimize fall risk.  Equipment: manual wheelchair  Reasons for discharge: treatment goals met and discharge from hospital  Patient/family agrees with progress made and goals achieved: Yes  Skilled Therapeutic Intervention Session 1: Patient currently requires supervision for all functional mobility due to decreased balance. Patient's daughter present at beginning of session to review recommendations and verbalized understanding. Patient's daughter reporting that patient back to baseline with functional mobility and will have 24/7 S. Patient's cervical collar noted to be loose, therefore adjusted collar in supine with daughter observing for improved fit and comfort. Patient performed toileting tasks with distant supervision to elevated seat. Trialed use of SPC with verbal/visual cues for sequencing 2-point gait pattern with good carryover within session. Patient performed car transfer to sedan height and furniture transfers to low compliant couch surfaces with supervision  and increased time. See below for further discharge details. Patient left sitting in wheelchair with all needs within reach.   Session 2: Patient sitting in wheelchair, requesting to use bathroom. Patient ambulated to/from bathroom and performed toileting tasks with supervision. Reviewed HEP handout for BLE strengthening and falls prevention using back of chair for RUE support: LAQ with 3# wt each LE, standing alt hip flexion x 30, standing heel raises x 20, standing hip abduction x 20 each LE, standing knee flexion x 20 each LE, squats x 15 with focus on keeping heels on ground, abducting BLE, and moving hips back to simulate sitting in a chair instead of knees coming forward. Patient ambulated throughout rehab unit 2 x 150 ft with supervision and left sitting in wheelchair with all needs within reach. Patient with no further questions regarding discharge.   PT Discharge Precautions/Restrictions Precautions Precautions: Cervical Required Braces or Orthoses: Sling;Cervical Brace (LUE sling) Cervical Brace: Hard collar;At all times Restrictions Weight Bearing Restrictions: Yes LUE Weight Bearing: Non weight bearing Pain Pain Assessment Pain Assessment: No/denies pain Vision/Perception   No changes from baseline  Cognition Orientation Level: Oriented X4 Sensation Sensation Light Touch: Appears Intact Stereognosis: Appears Intact Hot/Cold: Appears Intact Proprioception: Appears Intact Coordination Gross Motor Movements are Fluid and Coordinated: No Fine Motor Movements are Fluid and Coordinated: Yes Coordination and Movement Description: Mildly impaired by pain, premorbid scoliosis and BLE weakness Motor  Motor Motor: Within Functional Limits Motor - Discharge Observations: premorbid generalized BLE weakness secondary to polymyositis  Mobility Bed Mobility Bed Mobility: Sit to Supine;Supine to Sit Supine to Sit: 5: Supervision;HOB flat Sit to Supine: 5: Supervision;HOB  flat Transfers Transfers: Yes Sit to Stand: 5: Supervision;With upper extremity assist;From chair/3-in-1;From bed Stand to Sit: 5: Supervision;With upper extremity assist;To chair/3-in-1;To bed Locomotion  Ambulation Ambulation: Yes Ambulation/Gait Assistance: 5: Supervision Ambulation Distance (Feet): 200  Feet Assistive device: None;Straight cane Ambulation/Gait Assistance Details: Verbal cues for safe use of DME/AE;Visual cues for safe use of DME/AE Ambulation/Gait Assistance Details: verbal cues for sequencing 2-point gait pattern with SPC Gait Gait: Yes Gait Pattern: Impaired Gait Pattern: Narrow base of support;Step-through pattern;Decreased stride length;Right flexed knee in stance;Left flexed knee in stance;Trunk flexed Gait velocity: 10 MWT = 0.83 m/s Stairs / Additional Locomotion Stairs: Yes Stairs Assistance: 5: Supervision Stair Management Technique: One rail Right;Step to pattern;Alternating pattern;Forwards (alternating ascending, step-to descending) Number of Stairs: 12 Height of Stairs: 6 Ramp: 5: Supervision Curb: 5: Supervision Wheelchair Mobility Wheelchair Mobility: No (pt ambulatory)  Trunk/Postural Assessment  Cervical Assessment Cervical Assessment: Exceptions to [facility] (hx right lateral tilt secondary to scoliosis) Thoracic Assessment Thoracic Assessment: Exceptions to [facility] (hx of kyphosis, scoliosis) Lumbar Assessment Lumbar Assessment: Exceptions to WFL (hx of scoliosis) Postural Control Postural Control: Deficits on evaluation Protective Responses: delayed/impaired  Balance Balance Balance Assessed: Yes Static Standing Balance Static Standing - Balance Support: No upper extremity supported;During functional activity Static Standing - Level of Assistance: 5: Stand by assistance Dynamic Standing Balance Dynamic Standing - Balance Support: During functional activity;No upper extremity supported Dynamic Standing - Level of Assistance: 5: Stand by  assistance   Five times Sit to Stand Test (FTSS) Method: Use a straight back chair with a solid seat that is 16-18" high. Ask participant to sit on the chair with arms folded across their chest.   Instructions: "Stand up and sit down as quickly as possible 5 times, keeping your arms folded across your chest."   Measurement: Stop timing when the participant stands the 5th time.  TIME: ___25 with RUE support___ (in seconds)  Times > 13.6 seconds is associated with increased disability and morbidity (Guralnik, 2000) Times > 15 seconds is predictive of recurrent falls in healthy individuals aged 29 and older (Buatois, et al., 2008) Normal performance values in community dwelling individuals aged 21 and older (Bohannon, 2006): o 60-69 years: 11.4 seconds o 70-79 years: 12.6 seconds o 80-89 years: 14.8 seconds  MCID: ? 2.3 seconds for Vestibular Disorders (Meretta, 2006)  Four square step test  Performed two trials with supervision: 28 sec and 25 sec indicating patient is at risk for multiple falls  Extremity Assessment  RUE Assessment RUE Assessment: Within Functional Limits LUE Assessment LUE Assessment: Exceptions to [facility] LUE Strength LUE Overall Strength: Unable to assess;Due to precautions LUE Overall Strength Comments: NWB LUE RLE Assessment RLE Assessment: Within Functional Limits (grossly 5/5 throughout except 4+/5 hip flexion) LLE Assessment LLE Assessment: Within Functional Limits (grossly 5/5 throughout)  See FIM for current functional status  Laretta Alstrom 12/24/2014, 11:33 AM
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
## 2014-12-25 ENCOUNTER — Inpatient Hospital Stay (HOSPITAL_COMMUNITY): Payer: Medicare Other | Admitting: Speech Pathology

## 2014-12-25 ENCOUNTER — Inpatient Hospital Stay (HOSPITAL_COMMUNITY): Payer: Medicare Other

## 2014-12-25 DIAGNOSIS — S066X9S Traumatic subarachnoid hemorrhage with loss of consciousness of unspecified duration, sequela: Secondary | ICD-10-CM

## 2014-12-25 LAB — GLUCOSE, CAPILLARY: Glucose-Capillary: 110 mg/dL — ABNORMAL HIGH (ref 65–99)

## 2014-12-25 MED ORDER — TRAMADOL HCL 50 MG PO TABS: 50.0000 mg | ORAL_TABLET | Freq: Four times a day (QID) | ORAL | Status: DC | PRN

## 2014-12-25 NOTE — Progress Notes (Signed)
Social Work  Discharge Note  * Note, I did have a conference follow up discussion with pt and daughter yesterday afternoon.  Both aware and agreeable with plan for d/c today, however, daughter reports LOS is shorter than she had anticipated.  Daughter and son-in-law very pleased with pt's progress and agree she is ready for d/c.  The overall goal for the admission was met for:   Discharge location: Yes - home with daughter and son-in-law who can provide 24/7 assistance  Length of Stay: Yes - 7 days  Discharge activity level: Yes - supervision mobility and min assist UB B/D  Home/community participation: Yes  Services provided included: MD, RD, PT, OT, SLP, RN, TR, Pharmacy and [place]: Medicare and Private Insurance: [place]  Follow-up services arranged: Outpatient: PT, OT via [facility], DME: 18x18 lightweight w/c, cushion and tub seat via [facility] and Patient/Family has no preference for HH/DME agencies  Comments (or additional information):  Patient/Family verbalized understanding of follow-up arrangements: Yes  Individual responsible for coordination of the follow-up plan: pt and daughter  Confirmed correct DME delivered: Lennart Pall 12/25/2014    Kacen Mellinger
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
## 2014-12-25 NOTE — Progress Notes (Signed)
Speech Language Pathology Session Note & Discharge Summary  Patient Details  Name: Sara Lynch MRN: [id number] Date of Birth: 1928-12-27  Today's Date: 12/25/2014 SLP Individual Time: 0900-0930 SLP Individual Time Calculation (min): 30 min   Skilled Therapeutic Interventions:  Skilled treatment session focused on patient/family education with the patient's daughter in regards to the patient's current cognitive function and strategies to utilize at home to maximize safety and independence, especially in regards to medication management. Patient's daughter verbalized understanding of all information and reported the patient is at her cognitive baseline, therefore, skilled SLP f/u is not needed at this time. Patient left in wheelchair with daughter present.   Patient has met 4 of 4 long term goals.  Patient to discharge at overall Modified Independent;Supervision level.   Reasons goals not met: N/A   Clinical Impression/Discharge Summary: Patient has made functional gains and has met 4 of 4 LTG's this admission due to increased problem solving, attention, awareness and recall. Currently, patient is demonstrating behaviors consistent with a Rancho Level VIII and is overall Mod I in regards to functional problem solving for basic and familiar tasks, recall, selective attention and anticipatory awareness. Patient continues to require supervision verbal cues for complex problem solving and divided attention.  Patient and family education is complete. Patient and family both report the patient is at her cognitive baseline, therefore, f/u skilled SLP intervention is not warranted at this time.   Care Partner:  Caregiver Able to Provide Assistance: Yes     Recommendation:  None      Equipment: N/A   Reasons for discharge: Treatment goals met;Discharged from hospital   Patient/Family Agrees with Progress Made and Goals Achieved: Yes   See FIM for current functional status  Win Guajardo,  Danylah Holden 12/25/2014, 3:33 PM
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
## 2014-12-25 NOTE — Progress Notes (Signed)
Occupational Therapy Session Note  Patient Details  Name: Sara Lynch MRN: [id number] Date of Birth: Nov 30, 1928  Today's Date: 12/25/2014 OT Individual Time: 5374-8270 OT Individual Time Calculation (min): 21 min    Short Term Goals: Week 1:  OT Short Term Goal 1 (Week 1): Pt will complete toileting with mod assist to manage clothing OT Short Term Goal 2 (Week 1): Pt will complete upper body bathing with min assist to manage orthotics (sling, collar) OT Short Term Goal 3 (Week 1): Pt will complete lower body bathing with mod assist OT Short Term Goal 4 (Week 1): Pt will dress lower body with mod assist for socks and shoes & steadying to stand to pull up pants. OT Short Term Goal 5 (Week 1): Pt will complete BUE HEP with supervision  Skilled Therapeutic Interventions/Progress Updates:    Pt seen for OT session consisting of patient and family education/training. Therapist provided education on cervical collar care, don/doffing sling, adapted dressing techniques, and pendulum exercises. Pt received sitting in w/c with her daughter present for training. Therapist provided verbal instruction and demonstration of LUE sling, exercises, and dressing technique. Handouts were provided with visual and verbal instruction for both exercises and cervical collar care. Daughter stated she felt comfortable with and understood information provided. Pt left sitting in w/c with daughter and all other needs within reach.   Therapy Documentation Precautions:  Precautions Precautions: Cervical Required Braces or Orthoses: Sling, Cervical Brace (LUE sling) Cervical Brace: Hard collar, At all times Restrictions Weight Bearing Restrictions: Yes LUE Weight Bearing: Non weight bearing General:   Vital Signs: Therapy Vitals Pulse Rate: 84 BP: (!) 144/74 mmHg Pain:   ADL:   Exercises:   Other Treatments:    See FIM for current functional status  Therapy/Group: Individual Therapy  Dorann Ou 12/25/2014, 9:56 AM
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
## 2014-12-25 NOTE — Discharge Instructions (Signed)
Inpatient Rehab Discharge Instructions  Sara Lynch Discharge date and time: 12/25/14   Activities/Precautions/ Functional Status: Activity: no lifting, driving, or strenuous exercise till cleared by MD. Diet: diabetic diet Wound Care: keep wound clean and dry Wear sling at all times. May remove for bathing/dressing with shoulder immobilized.   Functional status:  ___ No restrictions     ___ Walk up steps independently _X__ 24/7 supervision/assistance   ___ Walk up steps with assistance ___ Intermittent supervision/assistance  ___ Bathe/dress independently ___ Walk with walker     _X__ Bathe/dress with assistance ___ Walk Independently    ___ Shower independently ___ Walk with assistance    ___ Shower with assistance _X__ No alcohol     ___ Return to work/school ________   COMMUNITY REFERRALS UPON DISCHARGE:    Outpatient: PT     OT                   Agency: [facility] Phone: [phone]                Appointment Date/Time: 12/29/14 @ 11:45 and 1:15 pm (bring a snack as there will be a 45 min break between therapy appointments)  Medical Equipment/Items Ordered: wheelchair, tub seat                                                    Agency/Supplier:  [facility] @ [phone]   Special Instructions: 1. Wear collar at all times.  2. No weight on Left arm.  3. Contact Dr. Erlinda Hong if you notice any redness or drainage from the incision or develop fever or chills    My questions have been answered and I understand these instructions. I will adhere to these goals and the provided educational materials after my discharge from the hospital.  Patient/Caregiver Signature _______________________________ Date __________  Clinician Signature _______________________________________ Date __________  Please bring this form and your medication list with you to all your follow-up doctor's appointments.

## 2014-12-25 NOTE — Progress Notes (Signed)
Patient and patient's daughter given discharge information by Algis Liming, PA, all questions answered. All belongings packed by daughter, as well as wheelchair and shower seat. Nurse tech assisted patient via wheelchair to car. [place]

## 2014-12-25 NOTE — Progress Notes (Signed)
Occupational Therapy Discharge Summary  Patient Details  Name: Sara Lynch MRN: [id number] Date of Birth: [birth year]  Today's Date: 12/25/2014 OT Individual Time:  -       Patient has met 12 of 12 long term goals due to improved activity tolerance, improved balance, postural control, ability to compensate for deficits and improved awareness.  Patient to discharge at overall Supervision level.  Patient's care partner is independent to provide the necessary physical and cognitive assistance at discharge.    Reasons goals not met: N/A. Pt met all LTG.   Recommendation:  Patient will benefit from ongoing skilled OT services in home health setting to continue to advance functional skills in the area of BADL and Reduce care partner burden.  Equipment: No equipment provided  Reasons for discharge: treatment goals met and discharge from hospital  Patient/family agrees with progress made and goals achieved: Yes  OT Discharge Precautions/Restrictions  Precautions Precautions: Cervical Precaution Comments: NWB in LUE Required Braces or Orthoses: Sling;Cervical Brace Cervical Brace: Hard collar;At all times Restrictions Weight Bearing Restrictions: Yes LUE Weight Bearing: Non weight bearing General   Vital Signs  Pain Pain Assessment Pain Assessment: No/denies pain ADL   Vision/Perception  Vision- History Baseline Vision/History: Wears glasses Wears Glasses: Reading only Patient Visual Report: No change from baseline Vision- Assessment Vision Assessment?: No apparent visual deficits  Cognition Overall Cognitive Status: History of cognitive impairments - at baseline Arousal/Alertness: Awake/alert Orientation Level: Oriented X4 Attention: Selective;Alternating;Divided Selective Attention: Appears intact Alternating Attention: Appears intact Alternating Attention Impairment: Verbal complex Divided Attention: Impaired Divided Attention Impairment: Functional complex Memory:  Impaired Memory Impairment: Retrieval deficit;Decreased recall of new information Awareness: Appears intact Problem Solving: Appears intact Reasoning: Appears intact Organizing: Appears intact Self Monitoring: Appears intact Self Monitoring Impairment: Verbal complex Safety/Judgment: Appears intact [employer] Scales of Cognitive Functioning: Purposeful/appropriate Sensation Sensation Light Touch: Appears Intact Stereognosis: Appears Intact Hot/Cold: Appears Intact Proprioception: Appears Intact Coordination Gross Motor Movements are Fluid and Coordinated: No Fine Motor Movements are Fluid and Coordinated: Yes (in RUE; NWB in LUE, difficult to assess ) Coordination and Movement Description: forward head/neck posture; scoliosis PTA  Motor  Motor Motor: Within Functional Limits Motor - Discharge Observations: premorbid BLE weakness due to polymyositis Mobility  Bed Mobility Bed Mobility: Sit to Supine;Supine to Sit Supine to Sit: 6: Modified independent (Device/Increase time) Sit to Supine: 6: Modified independent (Device/Increase time) Transfers Transfers: Sit to Stand;Stand to Sit Sit to Stand: With upper extremity assist;From bed;From chair/3-in-1;From toilet;5: Supervision Stand to Sit: 5: Supervision;With upper extremity assist;To toilet;To bed;To chair/3-in-1  Trunk/Postural Assessment  Cervical Assessment Cervical Assessment: Exceptions to [facility] (neck flexion and lateral tilt) Thoracic Assessment Thoracic Assessment: Exceptions to [facility] (kyphosis and scoliosis) Lumbar Assessment Lumbar Assessment: Exceptions to [facility] (scoliosis PTA) Postural Control Postural Control: Deficits on evaluation (overall decreased postural control) Protective Responses: delayed protective responses  Balance Balance Balance Assessed: Yes Static Sitting Balance Static Sitting - Balance Support: Feet supported;No upper extremity supported Dynamic Sitting Balance Dynamic Sitting - Balance  Support: During functional activity;No upper extremity supported Static Standing Balance Static Standing - Balance Support: No upper extremity supported;During functional activity Static Standing - Level of Assistance: 5: Stand by assistance Dynamic Standing Balance Dynamic Standing - Balance Support: During functional activity;No upper extremity supported Dynamic Standing - Level of Assistance: 5: Stand by assistance Extremity/Trunk Assessment RUE Assessment RUE Assessment: Within Functional Limits LUE Assessment LUE Assessment: Exceptions to WFL LUE AROM (degrees) Overall AROM Left Upper Extremity: Due to precautions;Deficits;Unable  to assess LUE Overall AROM Comments: NWB status in LUE  LUE Strength LUE Overall Strength: Unable to assess;Due to precautions;Deficits LUE Overall Strength Comments: NWB status in LUE   See FIM for current functional status  Dorann Ou 12/25/2014, 4:27 PM
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
## 2014-12-29 ENCOUNTER — Ambulatory Visit: Payer: Medicare Other | Attending: Nurse Practitioner | Admitting: Physical Therapy

## 2014-12-29 ENCOUNTER — Ambulatory Visit: Payer: Medicare Other | Admitting: Occupational Therapy

## 2014-12-29 ENCOUNTER — Encounter: Payer: Self-pay | Admitting: Physical Therapy

## 2014-12-29 DIAGNOSIS — R2689 Other abnormalities of gait and mobility: Secondary | ICD-10-CM | POA: Insufficient documentation

## 2014-12-29 DIAGNOSIS — M25611 Stiffness of right shoulder, not elsewhere classified: Secondary | ICD-10-CM

## 2014-12-29 DIAGNOSIS — R293 Abnormal posture: Secondary | ICD-10-CM | POA: Diagnosis not present

## 2014-12-29 DIAGNOSIS — M6281 Muscle weakness (generalized): Secondary | ICD-10-CM

## 2014-12-29 DIAGNOSIS — R278 Other lack of coordination: Secondary | ICD-10-CM | POA: Diagnosis not present

## 2014-12-29 DIAGNOSIS — R269 Unspecified abnormalities of gait and mobility: Secondary | ICD-10-CM | POA: Insufficient documentation

## 2014-12-29 DIAGNOSIS — R531 Weakness: Secondary | ICD-10-CM | POA: Diagnosis not present

## 2014-12-29 DIAGNOSIS — M25511 Pain in right shoulder: Secondary | ICD-10-CM
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
## 2014-12-31 ENCOUNTER — Telehealth: Payer: Self-pay | Admitting: Nurse Practitioner

## 2014-12-31 ENCOUNTER — Encounter: Payer: Self-pay | Admitting: Nurse Practitioner

## 2014-12-31 ENCOUNTER — Ambulatory Visit (INDEPENDENT_AMBULATORY_CARE_PROVIDER_SITE_OTHER): Payer: Medicare Other | Admitting: Nurse Practitioner

## 2014-12-31 VITALS — BP 132/74 | HR 64 | Temp 98.2°F | Resp 16 | Ht 67.0 in | Wt 128.0 lb

## 2014-12-31 DIAGNOSIS — H1132 Conjunctival hemorrhage, left eye: Secondary | ICD-10-CM

## 2014-12-31 DIAGNOSIS — R946 Abnormal results of thyroid function studies: Secondary | ICD-10-CM | POA: Diagnosis not present

## 2014-12-31 DIAGNOSIS — E559 Vitamin D deficiency, unspecified: Secondary | ICD-10-CM | POA: Diagnosis not present

## 2014-12-31 DIAGNOSIS — S0993XA Unspecified injury of face, initial encounter: Secondary | ICD-10-CM

## 2014-12-31 DIAGNOSIS — R7989 Other specified abnormal findings of blood chemistry: Secondary | ICD-10-CM | POA: Insufficient documentation

## 2014-12-31 DIAGNOSIS — D62 Acute posthemorrhagic anemia: Secondary | ICD-10-CM

## 2014-12-31 DIAGNOSIS — E8809 Other disorders of plasma-protein metabolism, not elsewhere classified: Secondary | ICD-10-CM | POA: Diagnosis not present

## 2014-12-31 DIAGNOSIS — S0181XD Laceration without foreign body of other part of head, subsequent encounter: Secondary | ICD-10-CM

## 2014-12-31 LAB — COMPREHENSIVE METABOLIC PANEL
ALK PHOS: 111 U/L (ref 39–117)
ALT: 11 U/L (ref 0–35)
AST: 18 U/L (ref 0–37)
Albumin: 3.8 g/dL (ref 3.5–5.2)
BUN: 26 mg/dL — ABNORMAL HIGH (ref 6–23)
CO2: 27 mEq/L (ref 19–32)
CREATININE: 1.15 mg/dL (ref 0.40–1.20)
Calcium: 9.2 mg/dL (ref 8.4–10.5)
Chloride: 100 mEq/L (ref 96–112)
GFR: 47.82 mL/min — ABNORMAL LOW (ref >60.00)
Glucose, Bld: 156 mg/dL — ABNORMAL HIGH (ref 70–99)
Potassium: 4.5 mEq/L (ref 3.5–5.1)
Sodium: 134 mEq/L — ABNORMAL LOW (ref 135–145)
TOTAL PROTEIN: 6.2 g/dL (ref 6.0–8.3)
Total Bilirubin: 1 mg/dL (ref 0.2–1.2)

## 2014-12-31 LAB — CBC
HCT: 36.2 % (ref 36.0–46.0)
HEMOGLOBIN: 12.2 g/dL (ref 12.0–15.0)
MCHC: 33.8 g/dL (ref 30.0–36.0)
MCV: 97.6 fl (ref 78.0–100.0)
PLATELETS: 321 10*3/uL (ref 150.0–400.0)
RBC: 3.71 Mil/uL — ABNORMAL LOW (ref 3.87–5.11)
RDW: 17.4 % — ABNORMAL HIGH (ref 11.5–15.5)
WBC: 6.6 10*3/uL (ref 4.0–10.5)

## 2014-12-31 LAB — TSH: TSH: 1.48 u[IU]/mL (ref 0.35–4.50)

## 2014-12-31 LAB — VITAMIN D 25 HYDROXY (VIT D DEFICIENCY, FRACTURES): VITD: 39.75 ng/mL (ref 30.00–100.00)

## 2014-12-31 NOTE — Telephone Encounter (Signed)
pls call pt: speak w/daughter, Sara Lynch Labs have normalized. She can increase Vit D to qd
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
## 2014-12-31 NOTE — Assessment & Plan Note (Signed)
resolved 

## 2014-12-31 NOTE — Telephone Encounter (Signed)
pls call daughter, Ms Levada Dy. After reviewing her meds, they are mostly prescribed by rheumatology & cardiology Rheum-methotrexate, allopurinol, folic acid Cardiology- coreg, norvasc, pravachol, protonix, lasix, plavix, aspirin, nitrostat. I can prescribe tramadol. Ask if she needs this-how often is she taking it? She can get vitamin D & miralax OTC. Cardiology increased her protonix when she had stent placed, but i will prescribe if needed.  Thnx!

## 2014-12-31 NOTE — Progress Notes (Signed)
Pre visit review using our clinic review tool, if applicable. No additional management support is needed unless otherwise documented below in the visit note. 

## 2014-12-31 NOTE — Assessment & Plan Note (Signed)
Keep appt w/Dr Erlinda Hong tomorrow

## 2014-12-31 NOTE — Telephone Encounter (Signed)
Patient's daughter aware.  She believes patient is good on the tramadol.

## 2014-12-31 NOTE — Assessment & Plan Note (Signed)
CBC today.  

## 2015-01-01 ENCOUNTER — Telehealth: Payer: Self-pay | Admitting: Nurse Practitioner

## 2015-01-01 ENCOUNTER — Ambulatory Visit: Payer: Medicare Other | Admitting: Nurse Practitioner

## 2015-01-01 DIAGNOSIS — R7989 Other specified abnormal findings of blood chemistry: Secondary | ICD-10-CM

## 2015-01-01 DIAGNOSIS — S42242A 4-part fracture of surgical neck of left humerus, initial encounter for closed fracture: Secondary | ICD-10-CM | POA: Diagnosis not present

## 2015-01-01 NOTE — Telephone Encounter (Signed)
Please advise 

## 2015-01-01 NOTE — Telephone Encounter (Signed)
Patients daughter aware

## 2015-01-01 NOTE — Telephone Encounter (Signed)
See ph note Check cmet in 4 wks

## 2015-01-01 NOTE — Telephone Encounter (Signed)
Sara Lynch would like for Sara Lynch to call and talk to her in regards to her mother Sara Lynch. After yesterdays visit with Sara Lynch was very upset and confused about the conversation regarding Plavix. Sara Lynch stated that Sara Lynch was "freaked out" and they had a rough nite. Sara Lynch is asking for advise.

## 2015-01-01 NOTE — Telephone Encounter (Signed)
Dr Trudie Reed recommends holding MTX for 2 wks. Spoke w/dtr. All qtsns answered.

## 2015-01-05 ENCOUNTER — Other Ambulatory Visit: Payer: Self-pay | Admitting: Cardiovascular Disease

## 2015-01-05 DIAGNOSIS — I6523 Occlusion and stenosis of bilateral carotid arteries: Secondary | ICD-10-CM

## 2015-01-12 DIAGNOSIS — S42242A 4-part fracture of surgical neck of left humerus, initial encounter for closed fracture: Secondary | ICD-10-CM | POA: Diagnosis not present

## 2015-01-13 ENCOUNTER — Ambulatory Visit: Payer: Medicare Other | Admitting: Nurse Practitioner

## 2015-01-13 ENCOUNTER — Telehealth: Payer: Self-pay | Admitting: Family Medicine

## 2015-01-13 NOTE — Telephone Encounter (Signed)
Pt's daughter is going out of town next week and she wants to know if her mother needs 24/7 care or what do you recommend her care needs are?  Pt's daughter would like them in writing.   Pt's daughter did schedule appointment with home instead tomorrow morning to discuss care options.  This agency basically just provides companion care.    Please advise?

## 2015-01-13 NOTE — Telephone Encounter (Signed)
See phone note

## 2015-01-14 ENCOUNTER — Ambulatory Visit: Payer: Medicare Other | Admitting: Nurse Practitioner

## 2015-01-16 ENCOUNTER — Encounter: Payer: Self-pay | Admitting: Nurse Practitioner

## 2015-01-16 ENCOUNTER — Ambulatory Visit (INDEPENDENT_AMBULATORY_CARE_PROVIDER_SITE_OTHER): Payer: Medicare Other | Admitting: Nurse Practitioner

## 2015-01-16 VITALS — BP 153/79 | HR 81 | Temp 98.2°F | Resp 16 | Ht 67.0 in | Wt 129.0 lb

## 2015-01-16 DIAGNOSIS — S0993XD Unspecified injury of face, subsequent encounter: Secondary | ICD-10-CM

## 2015-01-16 DIAGNOSIS — S12501D Unspecified nondisplaced fracture of sixth cervical vertebra, subsequent encounter for fracture with routine healing: Secondary | ICD-10-CM | POA: Diagnosis not present

## 2015-01-16 DIAGNOSIS — S42302S Unspecified fracture of shaft of humerus, left arm, sequela: Secondary | ICD-10-CM

## 2015-01-16 DIAGNOSIS — Z6825 Body mass index (BMI) 25.0-25.9, adult: Secondary | ICD-10-CM | POA: Diagnosis not present

## 2015-01-16 DIAGNOSIS — I10 Essential (primary) hypertension: Secondary | ICD-10-CM | POA: Diagnosis not present

## 2015-01-16 DIAGNOSIS — I6523 Occlusion and stenosis of bilateral carotid arteries: Secondary | ICD-10-CM | POA: Diagnosis not present

## 2015-01-16 DIAGNOSIS — S0181XD Laceration without foreign body of other part of head, subsequent encounter: Secondary | ICD-10-CM | POA: Diagnosis not present

## 2015-01-16 NOTE — Progress Notes (Signed)
Pre visit review using our clinic review tool, if applicable. No additional management support is needed unless otherwise documented below in the visit note. 
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
## 2015-01-16 NOTE — Assessment & Plan Note (Addendum)
Increasing mobility & strength Continue tramadol & tylenol as needed Continue to f/u w/ Dr Erlinda Hong

## 2015-01-16 NOTE — Assessment & Plan Note (Addendum)
Improving, but persistent red area R lower gum-plate likely bent Rinse after eating Remove as often as possible See dentist

## 2015-01-16 NOTE — Patient Instructions (Signed)
Resume methotrexate.   Continue tramadol & tylenol as needed for Pain.  Continue to keep partial plates out of mouth when not needed. Continue to rinse after eating. I think lower R plate is bent & causing irritation along gum. See dentitst when able.  Return in December for f/u of blood pressure.  Keep appointment with cardiology.

## 2015-01-16 NOTE — Assessment & Plan Note (Signed)
Removed 4 sutures No signs infection Well approximated

## 2015-01-19 ENCOUNTER — Ambulatory Visit: Payer: Medicare Other | Admitting: Physical Therapy

## 2015-01-19 ENCOUNTER — Ambulatory Visit: Payer: Medicare Other | Admitting: Occupational Therapy

## 2015-01-21 ENCOUNTER — Ambulatory Visit: Payer: Medicare Other | Admitting: Nurse Practitioner

## 2015-01-22 ENCOUNTER — Ambulatory Visit: Payer: Medicare Other | Admitting: Physical Therapy

## 2015-01-22 ENCOUNTER — Ambulatory Visit: Payer: Medicare Other | Admitting: Occupational Therapy

## 2015-01-29 ENCOUNTER — Encounter: Payer: Self-pay | Admitting: Occupational Therapy

## 2015-01-29 ENCOUNTER — Ambulatory Visit: Payer: Medicare Other | Admitting: Occupational Therapy

## 2015-01-29 ENCOUNTER — Encounter: Payer: Self-pay | Admitting: Physical Therapy

## 2015-01-29 ENCOUNTER — Ambulatory Visit: Payer: Medicare Other | Attending: Nurse Practitioner | Admitting: Physical Therapy

## 2015-01-29 DIAGNOSIS — R29898 Other symptoms and signs involving the musculoskeletal system: Secondary | ICD-10-CM | POA: Insufficient documentation

## 2015-01-29 DIAGNOSIS — M25512 Pain in left shoulder: Secondary | ICD-10-CM | POA: Diagnosis present

## 2015-01-29 DIAGNOSIS — M6281 Muscle weakness (generalized): Secondary | ICD-10-CM | POA: Diagnosis not present

## 2015-01-29 DIAGNOSIS — R269 Unspecified abnormalities of gait and mobility: Secondary | ICD-10-CM | POA: Diagnosis not present

## 2015-01-29 DIAGNOSIS — M25612 Stiffness of left shoulder, not elsewhere classified: Secondary | ICD-10-CM

## 2015-01-29 DIAGNOSIS — R2689 Other abnormalities of gait and mobility: Secondary | ICD-10-CM | POA: Diagnosis not present

## 2015-01-29 DIAGNOSIS — R293 Abnormal posture: Secondary | ICD-10-CM | POA: Diagnosis not present

## 2015-01-29 DIAGNOSIS — R278 Other lack of coordination: Secondary | ICD-10-CM

## 2015-01-29 DIAGNOSIS — S42242D 4-part fracture of surgical neck of left humerus, subsequent encounter for fracture with routine healing: Secondary | ICD-10-CM | POA: Diagnosis not present

## 2015-01-29 DIAGNOSIS — R531 Weakness: Secondary | ICD-10-CM | POA: Diagnosis not present
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
## 2015-01-29 NOTE — Patient Instructions (Addendum)
   Lie on back holding wand. Raise arms over head. Hold 5sec. Left hand hold at end of stick with thumb up Repeat 10 times per set.  Do 2-3 sessions per day.   ROM: Abduction - Wand   Holding wand with left hand palm up, push wand directly out to side, with left palm down, until stretch is felt. Hold 5 seconds. Repeat 10 times per set. Do 2-3 sessions per day. (Lying down)  SHOULDER: Flexion At Wall   Slide left arms up wall Maintain upright posture and tuck in stomach. Hold 3 seconds. 10 reps per set, 2-3 sets per day  Copyright  VHI. All rights reserved.  ROM: External / Internal Rotation - Wand   Holding wand with left hand palm up, push out from body with other hand, palm down. Keep left elbows bent. When stretch is felt, hold 5 seconds.   Repeat 10 times per set.  Do 2-3 sessions per day.  http://orth.exer.us/749   Copyright  VHI. All rights reserved.

## 2015-01-29 NOTE — Patient Instructions (Addendum)
   Functional Quadriceps: Sit to Stand   Sit on edge of chair, feet flat on floor. Stand upright, extending knees fully. Repeat _15___ times per set. Do __2-3__ sets per day.  http://orth.exer.us/735   Copyright  VHI. All rights reserved.    Copyright  VHI. All rights reserved.
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
## 2015-01-30 ENCOUNTER — Ambulatory Visit: Payer: Medicare Other | Admitting: Nurse Practitioner

## 2015-01-30 ENCOUNTER — Encounter (HOSPITAL_COMMUNITY): Payer: Medicare Other

## 2015-01-30 ENCOUNTER — Ambulatory Visit: Payer: Medicare Other | Admitting: Cardiovascular Disease

## 2015-02-02 ENCOUNTER — Ambulatory Visit: Payer: Medicare Other | Admitting: Physical Therapy

## 2015-02-02 ENCOUNTER — Ambulatory Visit: Payer: Medicare Other | Admitting: Occupational Therapy

## 2015-02-02 ENCOUNTER — Encounter: Payer: Medicare Other | Admitting: Occupational Therapy

## 2015-02-02 DIAGNOSIS — M25612 Stiffness of left shoulder, not elsewhere classified: Secondary | ICD-10-CM

## 2015-02-02 DIAGNOSIS — R2689 Other abnormalities of gait and mobility: Secondary | ICD-10-CM | POA: Diagnosis not present

## 2015-02-02 DIAGNOSIS — R531 Weakness: Secondary | ICD-10-CM | POA: Diagnosis not present

## 2015-02-02 DIAGNOSIS — M6281 Muscle weakness (generalized): Secondary | ICD-10-CM | POA: Diagnosis not present

## 2015-02-02 DIAGNOSIS — R29898 Other symptoms and signs involving the musculoskeletal system: Secondary | ICD-10-CM

## 2015-02-02 DIAGNOSIS — M25512 Pain in left shoulder: Secondary | ICD-10-CM

## 2015-02-02 DIAGNOSIS — R278 Other lack of coordination: Secondary | ICD-10-CM | POA: Diagnosis not present

## 2015-02-02 DIAGNOSIS — R269 Unspecified abnormalities of gait and mobility: Secondary | ICD-10-CM | POA: Diagnosis not present
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
## 2015-02-05 ENCOUNTER — Ambulatory Visit: Payer: Medicare Other | Admitting: Physical Therapy

## 2015-02-05 ENCOUNTER — Encounter: Payer: Self-pay | Admitting: Nurse Practitioner

## 2015-02-05 ENCOUNTER — Ambulatory Visit: Payer: Medicare Other | Admitting: Occupational Therapy

## 2015-02-05 ENCOUNTER — Encounter: Payer: Self-pay | Admitting: Occupational Therapy

## 2015-02-05 ENCOUNTER — Ambulatory Visit (INDEPENDENT_AMBULATORY_CARE_PROVIDER_SITE_OTHER): Payer: Medicare Other | Admitting: Nurse Practitioner

## 2015-02-05 ENCOUNTER — Encounter: Payer: Self-pay | Admitting: Physical Therapy

## 2015-02-05 VITALS — BP 159/83 | HR 84 | Temp 98.2°F | Resp 16 | Ht 67.0 in | Wt 129.0 lb

## 2015-02-05 VITALS — BP 182/102 | HR 96

## 2015-02-05 VITALS — BP 204/109 | HR 96

## 2015-02-05 DIAGNOSIS — R2689 Other abnormalities of gait and mobility: Secondary | ICD-10-CM

## 2015-02-05 DIAGNOSIS — M25612 Stiffness of left shoulder, not elsewhere classified: Secondary | ICD-10-CM

## 2015-02-05 DIAGNOSIS — R269 Unspecified abnormalities of gait and mobility: Secondary | ICD-10-CM

## 2015-02-05 DIAGNOSIS — M6281 Muscle weakness (generalized): Secondary | ICD-10-CM | POA: Diagnosis not present

## 2015-02-05 DIAGNOSIS — I6523 Occlusion and stenosis of bilateral carotid arteries: Secondary | ICD-10-CM

## 2015-02-05 DIAGNOSIS — R531 Weakness: Secondary | ICD-10-CM | POA: Diagnosis not present

## 2015-02-05 DIAGNOSIS — R278 Other lack of coordination: Secondary | ICD-10-CM

## 2015-02-05 DIAGNOSIS — F4311 Post-traumatic stress disorder, acute: Secondary | ICD-10-CM | POA: Diagnosis not present

## 2015-02-05 DIAGNOSIS — M25512 Pain in left shoulder: Secondary | ICD-10-CM

## 2015-02-05 DIAGNOSIS — R29898 Other symptoms and signs involving the musculoskeletal system: Secondary | ICD-10-CM

## 2015-02-05 NOTE — Progress Notes (Signed)
Pre visit review using our clinic review tool, if applicable. No additional management support is needed unless otherwise documented below in the visit note. 

## 2015-02-05 NOTE — Patient Instructions (Signed)
   Lie on back holding wand. Raise arms over head. Hold 5sec.** Left hand hold at end of stick with thumb up Repeat 10 times per set. Do 2-3 sessions per day.   ROM: Abduction - Wand   Holding wand with left hand palm up, push wand directly out to side, with left palm up, until stretch is felt. Hold 5 seconds. Repeat 10 times per set. Do 2-3 sessions per day. (Lying down)  SHOULDER: Flexion At Wall   Slide left arms up wall Maintain upright posture and tuck in stomach. Hold 3 seconds. 10 reps per set, 2-3 sets per day  Copyright  VHI. All rights reserved.  ROM: External / Internal Rotation - Wand   Holding wand with left hand palm up, push out from body with other hand, palm down. Keep left elbows bent and next to your side. When stretch is felt, hold 5 seconds.  Repeat 10 times per set. Do 2-3 sessions per day.  [email or web address].exer.[email or web address]
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
## 2015-02-09 ENCOUNTER — Ambulatory Visit: Payer: Medicare Other | Admitting: Physical Therapy

## 2015-02-09 ENCOUNTER — Ambulatory Visit: Payer: Medicare Other | Admitting: Occupational Therapy

## 2015-02-09 ENCOUNTER — Encounter: Payer: Self-pay | Admitting: Physical Therapy

## 2015-02-09 VITALS — BP 165/83 | HR 74

## 2015-02-09 DIAGNOSIS — R269 Unspecified abnormalities of gait and mobility: Secondary | ICD-10-CM

## 2015-02-09 DIAGNOSIS — R2689 Other abnormalities of gait and mobility: Secondary | ICD-10-CM | POA: Diagnosis not present

## 2015-02-09 DIAGNOSIS — R29898 Other symptoms and signs involving the musculoskeletal system: Secondary | ICD-10-CM

## 2015-02-09 DIAGNOSIS — R531 Weakness: Secondary | ICD-10-CM

## 2015-02-09 DIAGNOSIS — M6281 Muscle weakness (generalized): Secondary | ICD-10-CM | POA: Diagnosis not present

## 2015-02-09 DIAGNOSIS — M25612 Stiffness of left shoulder, not elsewhere classified: Secondary | ICD-10-CM | POA: Diagnosis not present

## 2015-02-09 DIAGNOSIS — M25512 Pain in left shoulder: Secondary | ICD-10-CM

## 2015-02-09 DIAGNOSIS — R293 Abnormal posture: Secondary | ICD-10-CM

## 2015-02-09 DIAGNOSIS — R278 Other lack of coordination: Secondary | ICD-10-CM | POA: Diagnosis not present

## 2015-02-09 NOTE — Patient Instructions (Signed)
Bridging   Slowly raise buttocks from floor, keeping stomach tight. Hold for 5 seconds. Repeat _10___ times per set. Do _1___ sets per session. Do _1-2___ sessions per day.  http://orth.exer.us/1096   Copyright  VHI. All rights reserved.  Hip Flexor Stretch   Lying on back near edge of bed, lower leg closest to edge of bed off bed so the foot goes to the floor, and then lift it back up onto the bed. 2 sets of 10 reps. Repeat with other leg. http://gt2.exer.us/347   Copyright  VHI. All rights reserved.  "I love a Art therapist top as needed for balance: High knee marching forward and backwards. Hold each knee up in air for 3 seconds (go slow!). Perform 3 laps forward/backward. 1-2 times a day  http://gt2.exer.us/345   Copyright  VHI. All rights reserved.  Walking on Toes   Walk on toes for ____ feet while continuing on a straight path.  Do ____ sessions per day.  Copyright  VHI. All rights reserved.  Walking on Toes   At counter top for balance: Walk on toes forward and then backwards. Repeat 3 laps each way. 1-2 times a day.  Copyright  VHI. All rights reserved.  Walking on Heels   At counter top: Walk on heels forward and then backwards. Repeat for 3 laps each way. 1-2 times a day.   Copyright  VHI. All rights reserved.

## 2015-02-09 NOTE — Patient Instructions (Addendum)
Blood pressure at beginning of session was 171/98, then after rest it decreased to 165/83.  Recommend that you monitor blood pressure at home and notify doctor if it is consistently running 140/90 or above (particularly the bottom number).
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
## 2015-02-10 ENCOUNTER — Encounter: Payer: Medicare Other | Admitting: Occupational Therapy

## 2015-02-10 ENCOUNTER — Ambulatory Visit: Payer: Medicare Other | Admitting: Physical Therapy
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
## 2015-02-13 ENCOUNTER — Ambulatory Visit: Payer: Medicare Other | Admitting: Physical Therapy

## 2015-02-13 ENCOUNTER — Encounter: Payer: Medicare Other | Admitting: Occupational Therapy

## 2015-02-17 ENCOUNTER — Encounter: Payer: Self-pay | Admitting: Physical Therapy

## 2015-02-17 ENCOUNTER — Encounter: Payer: Self-pay | Admitting: Occupational Therapy

## 2015-02-17 ENCOUNTER — Ambulatory Visit: Payer: Medicare Other | Admitting: Occupational Therapy

## 2015-02-17 ENCOUNTER — Ambulatory Visit: Payer: Medicare Other | Admitting: Physical Therapy

## 2015-02-17 DIAGNOSIS — R2689 Other abnormalities of gait and mobility: Secondary | ICD-10-CM | POA: Diagnosis not present

## 2015-02-17 DIAGNOSIS — M25612 Stiffness of left shoulder, not elsewhere classified: Secondary | ICD-10-CM

## 2015-02-17 DIAGNOSIS — M25512 Pain in left shoulder: Secondary | ICD-10-CM

## 2015-02-17 DIAGNOSIS — R269 Unspecified abnormalities of gait and mobility: Secondary | ICD-10-CM

## 2015-02-17 DIAGNOSIS — R293 Abnormal posture: Secondary | ICD-10-CM

## 2015-02-17 DIAGNOSIS — R531 Weakness: Secondary | ICD-10-CM | POA: Diagnosis not present

## 2015-02-17 DIAGNOSIS — R278 Other lack of coordination: Secondary | ICD-10-CM | POA: Diagnosis not present

## 2015-02-17 DIAGNOSIS — M6281 Muscle weakness (generalized): Secondary | ICD-10-CM | POA: Diagnosis not present

## 2015-02-17 NOTE — Patient Instructions (Signed)
Scapular Retraction: Rowing (Eccentric) - Arms - Side (Resistance Band)   Hold end of band in each hand. Pull back until elbows are even with trunk. Keep elbows by sides, thumbs up. Slowly release for 3-5 seconds. Use yellow resistance band. 10 reps per set, 1-2 sets per day   Strengthening: Resisted Extension   Attach one end to door.  Hold tubing in one hand, arm forward. Pull arm back, elbow straight. Repeat 10 times per set. Do 1-2 sessions per day.    Press-Up With Wand   Press wand up until elbows are straight, then reach wand over head to a pain free range.  Wrap 1lb ankle weight or 1lb bag of dried rice/beans around bar.  **HOLD STICK AT END WITH LEFT HAND WITH THUMB UP Hold 5 seconds. Repeat 10 times. Do 1-2 sessions per day.
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
## 2015-02-23 ENCOUNTER — Inpatient Hospital Stay: Payer: Medicare Other | Admitting: Physical Medicine & Rehabilitation

## 2015-02-23 ENCOUNTER — Encounter: Payer: Medicare Other | Admitting: Occupational Therapy

## 2015-02-24 ENCOUNTER — Ambulatory Visit: Payer: Medicare Other | Admitting: Physical Therapy

## 2015-02-26 ENCOUNTER — Encounter: Payer: Medicare Other | Admitting: Occupational Therapy

## 2015-02-26 ENCOUNTER — Ambulatory Visit: Payer: Medicare Other | Admitting: Physical Therapy

## 2015-02-27 ENCOUNTER — Ambulatory Visit: Payer: Medicare Other | Attending: Nurse Practitioner | Admitting: Physical Therapy

## 2015-02-27 DIAGNOSIS — R531 Weakness: Secondary | ICD-10-CM | POA: Diagnosis not present

## 2015-02-27 DIAGNOSIS — R269 Unspecified abnormalities of gait and mobility: Secondary | ICD-10-CM | POA: Diagnosis not present

## 2015-02-27 DIAGNOSIS — M25612 Stiffness of left shoulder, not elsewhere classified: Secondary | ICD-10-CM | POA: Insufficient documentation

## 2015-02-27 DIAGNOSIS — M25512 Pain in left shoulder: Secondary | ICD-10-CM | POA: Diagnosis not present

## 2015-02-27 DIAGNOSIS — R29898 Other symptoms and signs involving the musculoskeletal system: Secondary | ICD-10-CM | POA: Insufficient documentation
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
## 2015-03-02 ENCOUNTER — Encounter: Payer: Self-pay | Admitting: Physical Therapy

## 2015-03-02 NOTE — Therapy (Signed)
[facility] [address] [place], [place], [address] Phone: [phone]   Fax:  [phone]  Patient Details  Name: Sara Lynch MRN: [id number] Date of Birth: [birth year] Referring Provider:  No ref. provider found  Encounter Date: 03/02/2015  PHYSICAL THERAPY DISCHARGE SUMMARY  Visits from Start of Care: 6  Current functional level related to goals / functional outcomes:     PT Short Term Goals - 02/27/15 1534    PT SHORT TERM GOAL #1   Title Patient will perform HEP independently to demonstrate safe compliance and maximize functional gains made in physical therapy. Target date: 01/26/15.   Baseline 7/18: for current HEP. Will continue to monitor as HEP is progressed.   Period Weeks   Status Achieved   PT SHORT TERM GOAL #2   Title Pt will increase gait speed from 2.11 ft/sec to 2.41 ft/sec to demonstrate improved efficiency of ambulation. Target date: 01/26/15.   Baseline 7/18: 3.17 ft/sec without AD   Status Achieved   PT SHORT TERM GOAL #3   Title Pt will demonstrate ability to perform sit-to-stand from standard chair without UE assist to indicate increased functional LE strength. Target date: 01/26/15.   Baseline 8/5: Pt unable to stand from standard chair without UE support; however, pt reports inability to do this prior to hospitalization (due to h/o polymyositis).   Time 4   Period Weeks   Status Not Met   PT SHORT TERM GOAL #4   Title Pt will increase Berg Balance Scale score from 32 to 35/56 to indicate progress toward decrease fall risk. Target date: 01/26/15   Baseline Met: 01/29/15 scored 42/56   Time 4   Period Weeks   Status Achieved     2     PT Long Term Goals - 02/27/15 1537    PT LONG TERM GOAL #1   Title Patient will demonstrate improved gait speed from 2.11 ft/sec to 2.71 ft/sec to indicate significant decrease in risk of falling and increased stability with community ambulation.Target: 02/27/15.   Baseline 7/189: 3.17 ft/sec without AD   Status Achieved   PT LONG TERM GOAL #2   Title Pt will increase Berg Balance Scale score from 32 to 38/56 to indicate signficant decrease in fall risk. Target: 02/27/15.   Baseline 45/56 on 02/27/15   Status Achieved   PT LONG TERM GOAL #3   Title Pt will ambulate 1,000' over level/unlevel surfaces with mod I using LRAD to indicate increased stability/independence with ambulation. Target: 02/27/15.   Baseline Achieved    Status Achieved        Remaining deficits: Pt continues to demonstrate decreased functional LE strength; however, pt reports she is at functional baseline due to longstanding history of polymyositis.   Education / Equipment: Educated pt on home exercise program.  Plan: Patient agrees to discharge.  Patient goals were met. Patient is being discharged due to meeting the stated rehab goals.  ?????       Billie Ruddy, PT, [facility] [address] [place], [place], [address] Phone: [phone]   Fax:  [phone] 03/02/2015, 7:18 PM

## 2015-03-03 ENCOUNTER — Ambulatory Visit: Payer: Medicare Other | Admitting: Physical Therapy

## 2015-03-05 ENCOUNTER — Ambulatory Visit: Payer: Medicare Other | Admitting: Physical Therapy

## 2015-03-05 ENCOUNTER — Ambulatory Visit: Payer: Medicare Other | Admitting: Occupational Therapy

## 2015-03-05 ENCOUNTER — Encounter: Payer: Self-pay | Admitting: Occupational Therapy

## 2015-03-05 DIAGNOSIS — M25512 Pain in left shoulder: Secondary | ICD-10-CM | POA: Diagnosis not present

## 2015-03-05 DIAGNOSIS — S12501D Unspecified nondisplaced fracture of sixth cervical vertebra, subsequent encounter for fracture with routine healing: Secondary | ICD-10-CM | POA: Diagnosis not present

## 2015-03-05 DIAGNOSIS — R29898 Other symptoms and signs involving the musculoskeletal system: Secondary | ICD-10-CM | POA: Diagnosis not present

## 2015-03-05 DIAGNOSIS — R531 Weakness: Secondary | ICD-10-CM | POA: Diagnosis not present

## 2015-03-05 DIAGNOSIS — M25612 Stiffness of left shoulder, not elsewhere classified: Secondary | ICD-10-CM

## 2015-03-05 DIAGNOSIS — R269 Unspecified abnormalities of gait and mobility: Secondary | ICD-10-CM | POA: Diagnosis not present

## 2015-03-05 DIAGNOSIS — I10 Essential (primary) hypertension: Secondary | ICD-10-CM | POA: Diagnosis not present
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
## 2015-03-05 NOTE — Patient Instructions (Signed)
Pt and her daughter were instructed to cont with HEP with theraband and cane, as well as using LUE functionally and for ADL's/daily activities. She reports decreased pain and should benefit from cont. Therapy to increase resistance and strength. Pt will schedule appts (1x/week for 2 weeks) and review LTGS in coming weeks.

## 2015-03-10 ENCOUNTER — Ambulatory Visit: Payer: Medicare Other | Admitting: Physical Therapy

## 2015-03-12 ENCOUNTER — Ambulatory Visit: Payer: Medicare Other | Admitting: Occupational Therapy

## 2015-03-12 ENCOUNTER — Ambulatory Visit: Payer: Medicare Other | Admitting: Physical Therapy

## 2015-03-17 DIAGNOSIS — H9313 Tinnitus, bilateral: Secondary | ICD-10-CM | POA: Diagnosis not present

## 2015-03-17 DIAGNOSIS — H903 Sensorineural hearing loss, bilateral: Secondary | ICD-10-CM | POA: Diagnosis not present

## 2015-03-18 ENCOUNTER — Ambulatory Visit: Payer: Medicare Other | Admitting: *Deleted

## 2015-03-18 ENCOUNTER — Encounter: Payer: Self-pay | Admitting: *Deleted

## 2015-03-18 DIAGNOSIS — R29898 Other symptoms and signs involving the musculoskeletal system: Secondary | ICD-10-CM | POA: Diagnosis not present

## 2015-03-18 DIAGNOSIS — M25612 Stiffness of left shoulder, not elsewhere classified: Secondary | ICD-10-CM | POA: Diagnosis not present

## 2015-03-18 DIAGNOSIS — R269 Unspecified abnormalities of gait and mobility: Secondary | ICD-10-CM | POA: Diagnosis not present

## 2015-03-18 DIAGNOSIS — M25512 Pain in left shoulder: Secondary | ICD-10-CM | POA: Diagnosis not present

## 2015-03-18 DIAGNOSIS — R531 Weakness: Secondary | ICD-10-CM | POA: Diagnosis not present
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
## 2015-03-18 NOTE — Patient Instructions (Signed)
Reviewed LTG's today. Pt cont to have difficulty with AROM left shoulder flexion. She is able to retrieve light objects from cabinets in therapy gym (cones x15) from first 2 shelves with noted compensation/poor posture when reaching to second shelf as she gets tired. Reviewed LTG's and need for pt to cont HEP for AROM/strengthening with theraband to assist with increased functional use as well as AROM and strength over time. Pt verbalized understanding and d/c planning has begun. Anticipate d/c next visit, check remaining LTG for HEP MOD I and assess AROM for shoulder flexion.

## 2015-03-19 DIAGNOSIS — T148 Other injury of unspecified body region: Secondary | ICD-10-CM | POA: Diagnosis not present

## 2015-03-19 DIAGNOSIS — M2550 Pain in unspecified joint: Secondary | ICD-10-CM | POA: Diagnosis not present

## 2015-03-19 DIAGNOSIS — M3322 Polymyositis with myopathy: Secondary | ICD-10-CM | POA: Diagnosis not present

## 2015-03-19 DIAGNOSIS — I2510 Atherosclerotic heart disease of native coronary artery without angina pectoris: Secondary | ICD-10-CM | POA: Diagnosis not present

## 2015-03-19 DIAGNOSIS — Z79899 Other long term (current) drug therapy: Secondary | ICD-10-CM | POA: Diagnosis not present

## 2015-03-19 DIAGNOSIS — S42242D 4-part fracture of surgical neck of left humerus, subsequent encounter for fracture with routine healing: Secondary | ICD-10-CM | POA: Diagnosis not present

## 2015-03-19 DIAGNOSIS — M25512 Pain in left shoulder: Secondary | ICD-10-CM | POA: Diagnosis not present

## 2015-03-19 DIAGNOSIS — M810 Age-related osteoporosis without current pathological fracture: Secondary | ICD-10-CM | POA: Diagnosis not present

## 2015-03-20 ENCOUNTER — Telehealth: Payer: Self-pay | Admitting: Cardiovascular Disease

## 2015-03-20 DIAGNOSIS — E785 Hyperlipidemia, unspecified: Secondary | ICD-10-CM

## 2015-03-20 NOTE — Telephone Encounter (Signed)
Pts daughter calling to confirm if the pt needs to come in to see Dr Johnsie Cancel and have fasting labs same day, scheduled for 03/27/15.  Informed the pts daughter that according to Truitt Merle NP last dictation note from 11/26/14, the pt is to follow-up with Dr Johnsie Cancel in 6 weeks with fasting labs.  Informed the daughter that it appears this was not scheduled in the time frame it was supposed to be scheduled for, for that would be the explanation why the pt is being scheduled for September vs being seen within that 6 week time period of her last OV on 11/26/14 with Cecille Rubin.  Advised the daughter to keep the appt with Dr Johnsie Cancel for 9/2, for the pt should be seen by her General Cardiologist, especially with just having a stent placed in April 2016.   Advised the daughter to bring the pt in for her lab appt the same day, to make an easier commute for the daughter, for she works and has to transport the pt to and from appts.  Advised the daughter to make sure the pt is fasting for her lab appt.  Placed lab orders in (lipid and cmet), as indicated by Lori's last dictation note.  Daughter verbalized understanding and agrees with this plan.

## 2015-03-20 NOTE — Telephone Encounter (Signed)
New Message       Pt's daughter calling stating that she wants to know what the pt's upcoming appt is for. I told pt's daughter that the appt notes state it is for a 6wk f/u and fasting labs and she said the pt wasn't here 6 weeks ago and doesn't know what is being followed up since pt wasn't here. Please call back and advise.

## 2015-03-20 NOTE — Telephone Encounter (Signed)
Left message to call back  

## 2015-03-20 NOTE — Telephone Encounter (Signed)
F/u ° ° ° °Pt's daughter returning your call °
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
## 2015-03-27 ENCOUNTER — Encounter: Payer: Self-pay | Admitting: Cardiovascular Disease

## 2015-03-27 ENCOUNTER — Other Ambulatory Visit (INDEPENDENT_AMBULATORY_CARE_PROVIDER_SITE_OTHER): Payer: Medicare Other

## 2015-03-27 ENCOUNTER — Ambulatory Visit (INDEPENDENT_AMBULATORY_CARE_PROVIDER_SITE_OTHER): Payer: Medicare Other | Admitting: Cardiovascular Disease

## 2015-03-27 VITALS — BP 132/84 | HR 67 | Ht 67.0 in | Wt 132.0 lb

## 2015-03-27 DIAGNOSIS — E785 Hyperlipidemia, unspecified: Secondary | ICD-10-CM | POA: Diagnosis not present

## 2015-03-27 DIAGNOSIS — I6523 Occlusion and stenosis of bilateral carotid arteries: Secondary | ICD-10-CM | POA: Diagnosis not present

## 2015-03-27 DIAGNOSIS — I350 Nonrheumatic aortic (valve) stenosis: Secondary | ICD-10-CM

## 2015-03-27 DIAGNOSIS — R0989 Other specified symptoms and signs involving the circulatory and respiratory systems: Secondary | ICD-10-CM | POA: Diagnosis not present

## 2015-03-27 LAB — COMPREHENSIVE METABOLIC PANEL
ALBUMIN: 4.2 g/dL (ref 3.5–5.2)
ALK PHOS: 98 U/L (ref 39–117)
ALT: 24 U/L (ref 0–35)
AST: 25 U/L (ref 0–37)
BUN: 44 mg/dL — AB (ref 6–23)
CO2: 28 mEq/L (ref 19–32)
Calcium: 9.6 mg/dL (ref 8.4–10.5)
Chloride: 105 mEq/L (ref 96–112)
Creatinine, Ser: 1.17 mg/dL (ref 0.40–1.20)
GFR: 46.85 mL/min — AB (ref >60.00)
GLUCOSE: 138 mg/dL — AB (ref 70–99)
Potassium: 4.3 mEq/L (ref 3.5–5.1)
SODIUM: 142 meq/L (ref 135–145)
TOTAL PROTEIN: 6.9 g/dL (ref 6.0–8.3)
Total Bilirubin: 0.5 mg/dL (ref 0.2–1.2)

## 2015-03-27 LAB — LIPID PANEL
CHOLESTEROL: 162 mg/dL (ref 0–200)
HDL: 51.8 mg/dL (ref >39.00)
LDL CALC: 94 mg/dL (ref 0–99)
NonHDL: 110.31
Total CHOL/HDL Ratio: 3
Triglycerides: 80 mg/dL (ref 0.0–149.0)
VLDL: 16 mg/dL (ref 0.0–40.0)

## 2015-03-27 NOTE — Patient Instructions (Signed)
**Note De-Identified  Obfuscation** Medication Instructions:  Same-no changes  Labwork: None  Testing/Procedures: Your physician has requested that you have an echocardiogram. Echocardiography is a painless test that uses sound waves to create images of your heart. It provides your doctor with information about the size and shape of your heart and how well your heart's chambers and valves are working. This procedure takes approximately one hour. There are no restrictions for this procedure.  Your physician has requested that you have a carotid duplex. This test is an ultrasound of the carotid arteries in your neck. It looks at blood flow through these arteries that supply the brain with blood. Allow one hour for this exam. There are no restrictions or special instructions.    Follow-Up: Your physician wants you to follow-up in: 6 months. You will receive a reminder letter in the mail two months in advance. If you don't receive a letter, please call our office to schedule the follow-up appointment.

## 2015-04-01 ENCOUNTER — Encounter: Payer: Self-pay | Admitting: Occupational Therapy

## 2015-04-01 ENCOUNTER — Encounter: Payer: Self-pay | Admitting: Physical Medicine & Rehabilitation

## 2015-04-01 ENCOUNTER — Encounter: Payer: Medicare Other | Attending: Physical Medicine & Rehabilitation | Admitting: Physical Medicine & Rehabilitation

## 2015-04-01 ENCOUNTER — Ambulatory Visit: Payer: Medicare Other | Attending: Nurse Practitioner | Admitting: Occupational Therapy

## 2015-04-01 VITALS — BP 161/80 | HR 69

## 2015-04-01 DIAGNOSIS — I252 Old myocardial infarction: Secondary | ICD-10-CM | POA: Diagnosis not present

## 2015-04-01 DIAGNOSIS — R29898 Other symptoms and signs involving the musculoskeletal system: Secondary | ICD-10-CM | POA: Diagnosis not present

## 2015-04-01 DIAGNOSIS — R531 Weakness: Secondary | ICD-10-CM | POA: Diagnosis not present

## 2015-04-01 DIAGNOSIS — S42302S Unspecified fracture of shaft of humerus, left arm, sequela: Secondary | ICD-10-CM | POA: Diagnosis not present

## 2015-04-01 DIAGNOSIS — I10 Essential (primary) hypertension: Secondary | ICD-10-CM | POA: Insufficient documentation

## 2015-04-01 DIAGNOSIS — X58XXXS Exposure to other specified factors, sequela: Secondary | ICD-10-CM | POA: Insufficient documentation

## 2015-04-01 DIAGNOSIS — S069X1S Unspecified intracranial injury with loss of consciousness of 30 minutes or less, sequela: Secondary | ICD-10-CM

## 2015-04-01 DIAGNOSIS — I6523 Occlusion and stenosis of bilateral carotid arteries: Secondary | ICD-10-CM

## 2015-04-01 DIAGNOSIS — R011 Cardiac murmur, unspecified: Secondary | ICD-10-CM | POA: Insufficient documentation

## 2015-04-01 DIAGNOSIS — M25612 Stiffness of left shoulder, not elsewhere classified: Secondary | ICD-10-CM | POA: Diagnosis not present

## 2015-04-01 DIAGNOSIS — E785 Hyperlipidemia, unspecified: Secondary | ICD-10-CM | POA: Insufficient documentation
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
## 2015-04-01 NOTE — Patient Instructions (Signed)
TRY TO ESTABLISH A ROUTINE  CONSIDER A DAILY MEMORY LOG OR CALENDAR/ORGANIZER.    ?ALARM TO HELP CUE YOU FOR MEDICATIONS OR APPOINTMENTS.   CALM, QUIET ENVIRONMENTS WILL HELP YOU FOCUS MORE!!

## 2015-04-01 NOTE — Patient Instructions (Signed)
    CONTINUE DOING CANE EXERCISES AND THERABAND ROWS ISSUED PREVIOUSLY   Resisted Horizontal Abduction: Bilateral   Sit or stand, tubing in both hands, arms out in front. Keeping arms straight, pinch shoulder blades together and stretch arms out. Repeat _10___ times per set. Do _1-2___ sessions per day, every other day.   SHOULDER: Flexion Unilateral (Weight)   Start with arm at side. Raise arm forward and up TO EYE LEVEL ONLY. Keep elbow straight. Hold __2_ seconds. Hold small can of soup/vegetables (1 lb.) __10_ reps per set,  _2__ days per day.  Use _1__ lb dumbbell.
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
## 2015-04-03 ENCOUNTER — Other Ambulatory Visit: Payer: Self-pay | Admitting: *Deleted

## 2015-04-03 MED ORDER — PRAVASTATIN SODIUM 40 MG PO TABS: 40.0000 mg | ORAL_TABLET | Freq: Every day | ORAL | Status: DC

## 2015-05-14 ENCOUNTER — Other Ambulatory Visit: Payer: Self-pay | Admitting: Cardiovascular Disease

## 2015-05-14 MED ORDER — PANTOPRAZOLE SODIUM 40 MG PO TBEC: 40.0000 mg | DELAYED_RELEASE_TABLET | Freq: Every day | ORAL | Status: AC

## 2015-05-14 MED ORDER — CARVEDILOL 12.5 MG PO TABS: 12.5000 mg | ORAL_TABLET | Freq: Two times a day (BID) | ORAL | Status: DC

## 2015-06-02 DIAGNOSIS — M25512 Pain in left shoulder: Secondary | ICD-10-CM | POA: Diagnosis not present

## 2015-06-22 ENCOUNTER — Ambulatory Visit (INDEPENDENT_AMBULATORY_CARE_PROVIDER_SITE_OTHER): Payer: Medicare Other

## 2015-06-22 ENCOUNTER — Encounter: Payer: Self-pay | Admitting: Physical Medicine & Rehabilitation

## 2015-06-22 ENCOUNTER — Encounter: Payer: Medicare Other | Attending: Physical Medicine & Rehabilitation | Admitting: Physical Medicine & Rehabilitation

## 2015-06-22 VITALS — BP 187/68 | HR 74

## 2015-06-22 DIAGNOSIS — Z23 Encounter for immunization: Secondary | ICD-10-CM | POA: Diagnosis not present

## 2015-06-22 DIAGNOSIS — E785 Hyperlipidemia, unspecified: Secondary | ICD-10-CM | POA: Diagnosis not present

## 2015-06-22 DIAGNOSIS — M7502 Adhesive capsulitis of left shoulder: Secondary | ICD-10-CM

## 2015-06-22 DIAGNOSIS — R011 Cardiac murmur, unspecified: Secondary | ICD-10-CM | POA: Diagnosis not present

## 2015-06-22 DIAGNOSIS — S42302S Unspecified fracture of shaft of humerus, left arm, sequela: Secondary | ICD-10-CM

## 2015-06-22 DIAGNOSIS — S066X2S Traumatic subarachnoid hemorrhage with loss of consciousness of 31 minutes to 59 minutes, sequela: Secondary | ICD-10-CM | POA: Diagnosis not present

## 2015-06-22 DIAGNOSIS — I6523 Occlusion and stenosis of bilateral carotid arteries: Secondary | ICD-10-CM

## 2015-06-22 DIAGNOSIS — S069X1S Unspecified intracranial injury with loss of consciousness of 30 minutes or less, sequela: Secondary | ICD-10-CM | POA: Diagnosis not present

## 2015-06-22 DIAGNOSIS — I10 Essential (primary) hypertension: Secondary | ICD-10-CM | POA: Diagnosis not present

## 2015-06-22 DIAGNOSIS — I252 Old myocardial infarction: Secondary | ICD-10-CM | POA: Diagnosis not present

## 2015-06-22 DIAGNOSIS — M7500 Adhesive capsulitis of unspecified shoulder: Secondary | ICD-10-CM | POA: Insufficient documentation

## 2015-06-22 NOTE — Progress Notes (Signed)
Subjective:    Patient ID: Sara Lynch, female    DOB: 03-Jul-1929, 81 y.o.   MRN: [id number]  HPI  Sara Lynch is here in follow up of her TBI and left humeral fracture. She has been doing fairly well. She is doing odds and ends at home. She helps with dishes and laundry. She takes care of her bedroom and own personal needs.   From a memory standpoint, she struggles with short term, trivial information. She has some hi tech hearing aids which she had connected to her iphone initially---but they were too much for her to figure out.   She received a shot to her left shoulder which seemed to help somewhat. She is working on exercises for the left shoulder at home.    Pain Inventory Average Pain 3 Pain Right Now 3 My pain is aching  In the last 24 hours, has pain interfered with the following? General activity 3 Relation with others 0 Enjoyment of life 0 What TIME of day is your pain at its worst? NA Sleep (in general) NA  Pain is worse with: walking and standing Pain improves with: rest Relief from Meds: NA  Mobility walk without assistance walk with assistance how many minutes can you walk? 15 ability to climb steps?  yes do you drive?  no Do you have any goals in this area?  no  Function retired  Neuro/Psych bladder control problems weakness tremor dizziness confusion  Prior Studies Any changes since last visit?  no  Physicians involved in your care Any changes since last visit?  no   Family History  Problem Relation Age of Onset  . Cancer Mother     breast  . Hypertension Daughter   . Hyperlipidemia Daughter    Social History   Social History  . Marital Status: Widowed    Spouse Name: N/A  . Number of Children: 2  . Years of Education: N/A   Occupational History  . retired    Social History Main Topics  . Smoking status: Never Smoker   . Smokeless tobacco: None  . Alcohol Use: No  . Drug Use: No  . Sexual Activity: No   Other Topics  Concern  . None   Social History Narrative   Sara Lynch lives with her daughter in [place]. She is retired. She lived most of her life in [place], [place]. She has 2 grown daughters and several grand children.   Past Surgical History  Procedure Laterality Date  . Abdominal hysterectomy    . Cataract extraction Bilateral   . Mastectomy, radical Left   . Left heart catheterization with coronary angiogram N/A 11/17/2014    Procedure: LEFT HEART CATHETERIZATION WITH CORONARY ANGIOGRAM;  Surgeon: Peter M Martinique, MD;  Location: [facility] CATH LAB;  Service: Cardiovascular;  Laterality: N/A;  . Orif humerus fracture Left 12/17/2014    Procedure: OPEN REDUCTION INTERNAL FIXATION (ORIF) LEFT PROXIMAL HUMERUS FRACTURE;  Surgeon: Leandrew Koyanagi, MD;  Location: [facility];  Service: Orthopedics;  Laterality: Left;  . Appendectomy     Past Medical History  Diagnosis Date  . Heart murmur   . Hyperlipidemia   . UTI (urinary tract infection)   . Hypertension   . Myocardial infarction ([place])     times 2  . Breast cancer ([place])     81 yo: s/p radical mastectomy  . Polymyositis ([place])     Methotrexate x years (Dr. Lenna Gilford is rheum as of 2015)  . Gout  BP 187/68 mmHg  Pulse 74  SpO2 95%  Opioid Risk Score:   Fall Risk Score:  `1  Depression screen PHQ 2/9  Depression screen PHQ 2/9 04/01/2015  Decreased Interest 0  Down, Depressed, Hopeless 0  PHQ - 2 Score 0  Altered sleeping 0  Tired, decreased energy 0  Change in appetite 0  Feeling bad or failure about yourself  0  Trouble concentrating 0  Moving slowly or fidgety/restless 0  Suicidal thoughts 0  PHQ-9 Score 0     Review of Systems  Genitourinary:       Bladder control problems  Neurological: Positive for dizziness, tremors and weakness.  Hematological: Bruises/bleeds easily.  Psychiatric/Behavioral: Positive for confusion.  All other systems reviewed and are negative.      Objective:   Physical Exam  HEENT: face clear, oral mucosa pink and  moist  Cardio: RRR and murmur  Resp: CTA B/L and unlabored  GI: BS positive and NT, ND  Extremity: No Edema  Skin: Bruise Facial , LUE  Neuro: decreased hearing Right ear. Improved insight and awareness. STM deficits still present but improved.  Normal Sensory and Abnormal Motor LUE grossly 4 to 5/5 with some limitations still due to shoulder, RUE 5/5, 5/5 Bilateral HF, 4/5 KE and 5/5 ankles  Musc/Skel: left shoulder limited to 80 degrees abduction. Able to move arm more superiorly when she incorporates the scapula into movement. Legs without pain/edema  Gen NAD    Assessment/Plan:  1. Functional deficits secondary to TBI, left humeral head fx  -has continued to progress.   -continue to work on Corporate treasurer and techniques to compensate for memory deficits. -discussed realistic expectations moving forward. There is still potential for progress but changes will be much more subtle over the next year.  2. Pain Management: HEP 3. CAD/ Recent NSTEMI: Continue ASA/plavix. On Pravachol and coreg.  4. HTN: Monitor BP every 8 hours. Continue Norvasc daily.  5. Polymyositis: On Methotrexate.  6. Left Humeral head/neck fracture s/p ORIF:continue with ROM and HEP  -reviewed scapular ROM exercises today 7. Right sided hearing loss: hearing aid  Thirty minutes of face to face patient care time were spent during this visit. All questions were encouraged and answered.    Follow up with me on a prn basis.

## 2015-06-22 NOTE — Patient Instructions (Signed)
   CONTINUE TO WORK ON YOUR SHOULDER RANGE OF MOTION AT HOME.  USE YOUR KNEE SLEEVE IF YOU'RE UP FOR LONG PERIODS OF TIME.

## 2015-06-26 ENCOUNTER — Ambulatory Visit (INDEPENDENT_AMBULATORY_CARE_PROVIDER_SITE_OTHER): Payer: Medicare Other | Admitting: Family Medicine

## 2015-06-26 ENCOUNTER — Encounter: Payer: Self-pay | Admitting: Family Medicine

## 2015-06-26 VITALS — BP 172/78 | HR 78 | Temp 97.6°F | Resp 14 | Ht 67.0 in | Wt 131.8 lb

## 2015-06-26 DIAGNOSIS — I6523 Occlusion and stenosis of bilateral carotid arteries: Secondary | ICD-10-CM | POA: Diagnosis not present

## 2015-06-26 DIAGNOSIS — J209 Acute bronchitis, unspecified: Secondary | ICD-10-CM | POA: Diagnosis not present

## 2015-06-26 MED ORDER — DOXYCYCLINE HYCLATE 100 MG PO TABS: 100.0000 mg | ORAL_TABLET | Freq: Two times a day (BID) | ORAL | Status: DC

## 2015-06-26 NOTE — Progress Notes (Signed)
Subjective:    Patient ID: Sara Lynch, female    DOB: [birth year], 81 y.o.   MRN: [id number]  HPI  CC: sinus congestion  Sinus: Patient presents for an acute office visit with her daughter, with complaints of rhinorrhea, congestion, dry cough and decreased appetite. Patient does have mild cognitive impairment. Her daughter states since her mother underwent radiation of her breast she seems to be susceptible to chest congestion and colds. Family and patient deny fever, chills, nausea, vomit or diarrhea. She does have 2 family members within the household that also has sinus infections. She endorses increased fatigue. She denies myalgias or headaches. She has been taking Mucinex DM.   HTN: Patient presented for acute illness with elevated blood pressure readings in the office today, as well as another provider's office 4 days ago. Electronically medical record review of blood pressures show normal blood pressures with the exception of the past 2. Patient and daughter report compliance with medications. He states she only takes Lasix when needed, or her weight fluctuates. She has not needed Lasix for quite a few months. She has been taking Mucinex DM for sinus infection.   Never smoker  Past Medical History  Diagnosis Date  . Heart murmur   . Hyperlipidemia   . UTI (urinary tract infection)   . Hypertension   . Myocardial infarction ([place])     times 2  . Breast cancer ([place])     81 yo: s/p radical mastectomy  . Polymyositis ([place])     Methotrexate x years (Dr. Lenna Gilford is rheum as of 2015)  . Gout    Allergies  Allergen Reactions  . Enalapril Maleate Cough  . Oxycodone Nausea And Vomiting    Abdominal pain  . Codeine Nausea And Vomiting  . Penicillins Swelling  . Fosamax [Alendronate Sodium] Other (See Comments)    Headaches    Review of Systems Negative, with the exception of above mentioned in HPI     Objective:   Physical Exam BP 185/84 mmHg  Pulse 78  Temp(Src) 97.6 F  (36.4 C) (Oral)  Resp 14  Ht 5\' 7"  (1.702 m)  Wt 131 lb 12.8 oz (59.784 kg)  BMI 20.64 kg/m2  SpO2 97% Gen: Afebrile. No acute distress. Nontoxic appearance, well-developed, well-nourished,  Caucasian female with mild cognitive impairment. HENT: AT. [place]. Bilateral TM visualized and normal in appearance. Mildly tacky mucous membranes. Bilateral nares without erythema, no swelling. Throat without erythema or exudates. Mild cough on exam. Mild hoarseness on exam. Eyes:Pupils Equal Round Reactive to light, Extraocular movements intact,  Conjunctiva without redness, discharge or icterus. Neck/lymp/endocrine: Supple, mild cervical anterior lymphadenopathy CV: RRR, no edema, +2/4 P posterior tibialis pulses Chest: CTAB, no wheeze or crackles. Mild rhonchi anteriorly on exam. Abd: Soft. Flat . NTND.     Assessment & Plan:  1. Acute bronchitis, unspecified organism - Patient with signs and symptoms of bronchitis exam today with cough. Rest, hydrate, doxycycline. Watch for signs and symptoms of dehydration, and trying to drink plenty of water daily. Patient and daughter educated on signs of dehydration. Patient to continue Mucinex/plus minus Flonase daily. - doxycycline (VIBRA-TABS) 100 MG tablet; Take 1 tablet (100 mg total) by mouth 2 (two) times daily.  Dispense: 20 tablet; Refill: 0  2. Elevated blood pressure: Uncertain if elevated blood pressure secondary to acute illness. Was elevated as well had another provider's office was approximately 4 days ago. Patient was ill at that time also. Will treat patient for acute illness, follow-up  in one week with repeat blood pressure. If elevated will need to reevaluate blood pressure medications.   1 week for BP and acute illnes recheck.

## 2015-06-26 NOTE — Progress Notes (Signed)
Pre visit review using our clinic review tool, if applicable. No additional management support is needed unless otherwise documented below in the visit note. 

## 2015-06-26 NOTE — Patient Instructions (Signed)
I have called in an antibiotic for you. I want you to stay well hydrated and monitor for any signs of dehydration that we discussed today. Continue mucinex as well and could add flonase daily. 1 week follow up

## 2015-06-29 ENCOUNTER — Telehealth: Payer: Self-pay | Admitting: Family Medicine

## 2015-06-29 MED ORDER — BENZONATATE 100 MG PO CAPS: 100.0000 mg | ORAL_CAPSULE | Freq: Two times a day (BID) | ORAL | Status: DC | PRN

## 2015-06-29 NOTE — Telephone Encounter (Signed)
Spoke with patient daughter Rosanne Ashing she states her mother took 1 nitro last evening and it helped some with the chest pain but she stills feels the chest pain is being caused by her mothers cough. Advised daughter Ladona Ridgel were called in to her mothers pharmacy for cough.Instructed daughter if patient has chest pain, SOB,nausea,vomiting she will need further evaluation. Patient daughter verbalized understanding.

## 2015-06-29 NOTE — Telephone Encounter (Signed)
Patient had a bad night last night, she had to take nitroglycerin which was the first time she has needed it since April. She was up all night coughing. Is there anything she can take for the cough?

## 2015-06-29 NOTE — Telephone Encounter (Signed)
Telephone note is concerning, considering they are requesting something for her cough because she was up all night coughing and states she had to take a "nitroglycerin ". If this is accurate, and patient needed to take a nitroglycerin for chest pain (cardiac), considering she had high blood pressure in the office last week, she should be seen. If she is not having chest pain, and she needs something for cough, I can call in some Tessalon Perles for the cough (called into her pharmacy).

## 2015-07-02 ENCOUNTER — Encounter: Payer: Self-pay | Admitting: Family Medicine

## 2015-07-02 ENCOUNTER — Ambulatory Visit (INDEPENDENT_AMBULATORY_CARE_PROVIDER_SITE_OTHER): Payer: Medicare Other | Admitting: Family Medicine

## 2015-07-02 VITALS — BP 156/82 | HR 87 | Temp 98.8°F | Resp 20 | Wt 132.2 lb

## 2015-07-02 DIAGNOSIS — I6523 Occlusion and stenosis of bilateral carotid arteries: Secondary | ICD-10-CM | POA: Diagnosis not present

## 2015-07-02 DIAGNOSIS — I10 Essential (primary) hypertension: Secondary | ICD-10-CM

## 2015-07-02 DIAGNOSIS — J209 Acute bronchitis, unspecified: Secondary | ICD-10-CM | POA: Diagnosis not present

## 2015-07-02 NOTE — Progress Notes (Signed)
   Subjective:    Patient ID: Sara Lynch, female    DOB: 1928/10/23, 81 y.o.   MRN: [id number]  HPI   Cough: Patient states that her bronchitis symptoms have improved. She is continuing to take the doxycycline and Mucinex. She is also taking Best boy, however she states her cough remains. She feels as if something is rattling in her chest, but she is unable to produce anything with her cough. No fevers, chills, nausea, vomit or diarrhea. Patient is eating and drinking better, and has started to have more energy. Cough worse at night.   HTN: Patient presented last week for an office visit with elevated pressures of  172/78, and elevated pressures a few days prior per EMR review. Patient returns today to have blood pressure rechecked after treatment for acute illness. Patient's blood pressure today is improved. Her daughter states that she is better hydrated, and she is starting to eat, drink and interact better.   Never smoker  Past Medical History  Diagnosis Date  . Heart murmur   . Hyperlipidemia   . UTI (urinary tract infection)   . Hypertension   . Myocardial infarction ([place])     times 2  . Breast cancer ([place])     81 yo: s/p radical mastectomy  . Polymyositis ([place])     Methotrexate x years (Dr. Lenna Gilford is rheum as of 2015)  . Gout    Allergies  Allergen Reactions  . Enalapril Maleate Cough  . Oxycodone Nausea And Vomiting    Abdominal pain  . Codeine Nausea And Vomiting  . Penicillins Swelling  . Fosamax [Alendronate Sodium] Other (See Comments)    Headaches     Review of Systems Negative, with the exception of above mentioned in HPI     Objective:   Physical Exam BP 156/82 mmHg  Pulse 87  Temp(Src) 98.8 F (37.1 C) (Oral)  Resp 20  Wt 132 lb 4 oz (59.988 kg)  SpO2 93% Gen: Afebrile. No acute distress. Nontoxic in appearance, well-developed, well-nourished, Caucasian female. HENT: AT. [place].  MMM.  Eyes:Pupils Equal Round Reactive to light, Extraocular  movements intact,  Conjunctiva without redness, discharge or icterus. CV: RRR  Chest: CTAB, no wheeze or crackles. No rhonchi.     Assessment & Plan:  1. Acute bronchitis, unspecified organism - improved. Cough remains, however patient is much improved. Appears better hydrated. - Continue antibiotics until completion. Patient can use an over-the-counter cough suppressant with Mucinex. +/- antihistamine (claritin, allegra)   2. Essential hypertension, benign - Blood pressure much improved. No changes in regimen today. Elevated pressures likely from acute illness. Follow-up as needed

## 2015-07-02 NOTE — Patient Instructions (Addendum)
Robitussin Dm (Guaifenesin / Dextromethorphan) only, no decongestant  Try antihistamine of choice: Claritin, zyrtec or allegra, before bed.  Cough can stay on for a few weeks. As long as you continue to improve and stay hydrated, then you do not need to follow up. If you do not improve or worsen,then be seen again.   Your BP is much improved. No changes today to regimen

## 2015-07-10 ENCOUNTER — Telehealth: Payer: Self-pay | Admitting: Cardiovascular Disease

## 2015-07-10 MED ORDER — NITROGLYCERIN 0.4 MG SL SUBL: 0.4000 mg | SUBLINGUAL_TABLET | SUBLINGUAL | Status: AC | PRN

## 2015-07-10 NOTE — Telephone Encounter (Signed)
Patient c/o having chest pain yesterday and during the night Patient said that Dr. Johnsie Cancel had instructed her to take nitroglycerin as ordered for any chest pain that might occur. She is out of town at [facility] and thinks her RX for nitro is old Her son will be able to bring her new nitroglycerin pills from pharmacy in [place] but needs refill sent in Nitroglycerin refilled and sent to [facility] in [place] [phone])  Instructed patient if nitroglycerin does not help that she needs to seek medical care. Knows if necessary to call (9-1-1), go to the ED in [place] or a local urgent care to seek care  Routed to Dr. Nolen Mu number for patient

## 2015-07-10 NOTE — Telephone Encounter (Signed)
New message      Pt c/o of Chest Pain: STAT if CP now or developed within 24 hours  1. Are you having CP right now? no 2. Are you experiencing any other symptoms (ex. SOB, nausea, vomiting, sweating)? no  3. How long have you been experiencing CP?  Once last week, and again last night and this am 4. Is your CP continuous or coming and going? Burning, hurting pill 5. Have you taken Nitroglycerin?  Yes, took one this am Pt is out of town at [employer] ?

## 2015-07-13 ENCOUNTER — Encounter (HOSPITAL_COMMUNITY): Payer: Self-pay | Admitting: Family Medicine

## 2015-07-13 ENCOUNTER — Emergency Department (HOSPITAL_COMMUNITY): Payer: Medicare Other

## 2015-07-13 ENCOUNTER — Inpatient Hospital Stay (HOSPITAL_COMMUNITY)
Admission: EM | Admit: 2015-07-13 | Discharge: 2015-07-15 | DRG: 251 | Disposition: A | Discharge status: Home / Self Care | Payer: Medicare Other | Attending: Cardiology | Admitting: Cardiology

## 2015-07-13 ENCOUNTER — Observation Stay (HOSPITAL_COMMUNITY): Payer: Medicare Other

## 2015-07-13 DIAGNOSIS — T82855A Stenosis of coronary artery stent, initial encounter: Secondary | ICD-10-CM | POA: Diagnosis present

## 2015-07-13 DIAGNOSIS — Z79899 Other long term (current) drug therapy: Secondary | ICD-10-CM | POA: Diagnosis not present

## 2015-07-13 DIAGNOSIS — E1165 Type 2 diabetes mellitus with hyperglycemia: Secondary | ICD-10-CM | POA: Diagnosis present

## 2015-07-13 DIAGNOSIS — Z853 Personal history of malignant neoplasm of breast: Secondary | ICD-10-CM

## 2015-07-13 DIAGNOSIS — F0390 Unspecified dementia without behavioral disturbance: Secondary | ICD-10-CM | POA: Diagnosis present

## 2015-07-13 DIAGNOSIS — M109 Gout, unspecified: Secondary | ICD-10-CM | POA: Diagnosis present

## 2015-07-13 DIAGNOSIS — I739 Peripheral vascular disease, unspecified: Secondary | ICD-10-CM

## 2015-07-13 DIAGNOSIS — Z885 Allergy status to narcotic agent status: Secondary | ICD-10-CM | POA: Diagnosis not present

## 2015-07-13 DIAGNOSIS — I38 Endocarditis, valve unspecified: Secondary | ICD-10-CM

## 2015-07-13 DIAGNOSIS — N183 Chronic kidney disease, stage 3 unspecified: Secondary | ICD-10-CM

## 2015-07-13 DIAGNOSIS — I25110 Atherosclerotic heart disease of native coronary artery with unstable angina pectoris: Principal | ICD-10-CM | POA: Diagnosis present

## 2015-07-13 DIAGNOSIS — Z87891 Personal history of nicotine dependence: Secondary | ICD-10-CM | POA: Diagnosis not present

## 2015-07-13 DIAGNOSIS — I252 Old myocardial infarction: Secondary | ICD-10-CM | POA: Diagnosis not present

## 2015-07-13 DIAGNOSIS — Z7982 Long term (current) use of aspirin: Secondary | ICD-10-CM

## 2015-07-13 DIAGNOSIS — E785 Hyperlipidemia, unspecified: Secondary | ICD-10-CM | POA: Diagnosis present

## 2015-07-13 DIAGNOSIS — I200 Unstable angina: Secondary | ICD-10-CM

## 2015-07-13 DIAGNOSIS — I350 Nonrheumatic aortic (valve) stenosis: Secondary | ICD-10-CM | POA: Diagnosis not present

## 2015-07-13 DIAGNOSIS — R911 Solitary pulmonary nodule: Secondary | ICD-10-CM | POA: Diagnosis not present

## 2015-07-13 DIAGNOSIS — R918 Other nonspecific abnormal finding of lung field: Secondary | ICD-10-CM

## 2015-07-13 DIAGNOSIS — E1122 Type 2 diabetes mellitus with diabetic chronic kidney disease: Secondary | ICD-10-CM | POA: Diagnosis present

## 2015-07-13 DIAGNOSIS — I129 Hypertensive chronic kidney disease with stage 1 through stage 4 chronic kidney disease, or unspecified chronic kidney disease: Secondary | ICD-10-CM | POA: Diagnosis present

## 2015-07-13 DIAGNOSIS — R079 Chest pain, unspecified: Secondary | ICD-10-CM | POA: Diagnosis not present

## 2015-07-13 DIAGNOSIS — Z880 Allergy status to penicillin: Secondary | ICD-10-CM | POA: Diagnosis not present

## 2015-07-13 DIAGNOSIS — Z8249 Family history of ischemic heart disease and other diseases of the circulatory system: Secondary | ICD-10-CM | POA: Diagnosis not present

## 2015-07-13 DIAGNOSIS — I2510 Atherosclerotic heart disease of native coronary artery without angina pectoris: Secondary | ICD-10-CM | POA: Diagnosis not present

## 2015-07-13 DIAGNOSIS — Y831 Surgical operation with implant of artificial internal device as the cause of abnormal reaction of the patient, or of later complication, without mention of misadventure at the time of the procedure: Secondary | ICD-10-CM | POA: Diagnosis present

## 2015-07-13 DIAGNOSIS — Z9012 Acquired absence of left breast and nipple: Secondary | ICD-10-CM

## 2015-07-13 DIAGNOSIS — R739 Hyperglycemia, unspecified: Secondary | ICD-10-CM

## 2015-07-13 DIAGNOSIS — Z9841 Cataract extraction status, right eye: Secondary | ICD-10-CM

## 2015-07-13 DIAGNOSIS — I779 Disorder of arteries and arterioles, unspecified: Secondary | ICD-10-CM | POA: Insufficient documentation

## 2015-07-13 DIAGNOSIS — Z7722 Contact with and (suspected) exposure to environmental tobacco smoke (acute) (chronic): Secondary | ICD-10-CM | POA: Diagnosis present

## 2015-07-13 DIAGNOSIS — Z9842 Cataract extraction status, left eye: Secondary | ICD-10-CM | POA: Diagnosis not present

## 2015-07-13 DIAGNOSIS — M3320 Polymyositis, organ involvement unspecified: Secondary | ICD-10-CM | POA: Diagnosis present

## 2015-07-13 DIAGNOSIS — I10 Essential (primary) hypertension: Secondary | ICD-10-CM | POA: Diagnosis not present

## 2015-07-13 DIAGNOSIS — Z7902 Long term (current) use of antithrombotics/antiplatelets: Secondary | ICD-10-CM

## 2015-07-13 DIAGNOSIS — Z888 Allergy status to other drugs, medicaments and biological substances status: Secondary | ICD-10-CM | POA: Diagnosis not present

## 2015-07-13 HISTORY — DX: Peripheral vascular disease, unspecified: I73.9

## 2015-07-13 HISTORY — DX: Nontraumatic subarachnoid hemorrhage, unspecified: I60.9

## 2015-07-13 HISTORY — DX: Prediabetes: R73.03

## 2015-07-13 HISTORY — DX: Atherosclerotic heart disease of native coronary artery without angina pectoris: I25.10

## 2015-07-13 HISTORY — DX: Solitary pulmonary nodule: R91.1

## 2015-07-13 HISTORY — DX: Disorder of arteries and arterioles, unspecified: I77.9

## 2015-07-13 HISTORY — DX: Chronic kidney disease, stage 3 (moderate): N18.3

## 2015-07-13 HISTORY — DX: Nonrheumatic aortic (valve) stenosis: I35.0

## 2015-07-13 HISTORY — DX: Other amnesia: R41.3

## 2015-07-13 HISTORY — DX: Essential (primary) hypertension: I10

## 2015-07-13 HISTORY — DX: Chronic kidney disease, stage 3 unspecified: N18.30

## 2015-07-13 LAB — BASIC METABOLIC PANEL
Anion gap: 9 (ref 5–15)
BUN: 21 mg/dL — ABNORMAL HIGH (ref 6–20)
CALCIUM: 9.3 mg/dL (ref 8.9–10.3)
CO2: 24 mmol/L (ref 22–32)
CREATININE: 1.14 mg/dL — AB (ref 0.44–1.00)
Chloride: 106 mmol/L (ref 101–111)
GFR, EST AFRICAN AMERICAN: 50 mL/min — AB (ref >60)
GFR, EST NON AFRICAN AMERICAN: 43 mL/min — AB (ref >60)
Glucose, Bld: 276 mg/dL — ABNORMAL HIGH (ref 65–99)
Potassium: 3.9 mmol/L (ref 3.5–5.1)
SODIUM: 139 mmol/L (ref 135–145)

## 2015-07-13 LAB — I-STAT TROPONIN, ED
TROPONIN I, POC: 0.02 ng/mL (ref 0.00–0.08)
TROPONIN I, POC: 0.01 ng/mL (ref 0.00–0.08)

## 2015-07-13 LAB — CBC
HCT: 33.8 % — ABNORMAL LOW (ref 36.0–46.0)
Hemoglobin: 11.5 g/dL — ABNORMAL LOW (ref 12.0–15.0)
MCH: 34.7 pg — ABNORMAL HIGH (ref 26.0–34.0)
MCHC: 34 g/dL (ref 30.0–36.0)
MCV: 102.1 fL — ABNORMAL HIGH (ref 78.0–100.0)
PLATELETS: 271 10*3/uL (ref 150–400)
RBC: 3.31 MIL/uL — AB (ref 3.87–5.11)
RDW: 13.9 % (ref 11.5–15.5)
WBC: 6.5 10*3/uL (ref 4.0–10.5)

## 2015-07-13 LAB — PROTIME-INR
INR: 1.07 (ref 0.00–1.49)
PROTHROMBIN TIME: 14.1 s (ref 11.6–15.2)

## 2015-07-13 LAB — TROPONIN I: Troponin I: <0.03 ng/mL (ref <0.031)

## 2015-07-13 LAB — TSH: TSH: 2.167 u[IU]/mL (ref 0.350–4.500)

## 2015-07-13 MED ORDER — SODIUM CHLORIDE 0.9 % IJ SOLN
3.0000 mL | Freq: Two times a day (BID) | INTRAMUSCULAR | Status: DC
  Administered 2015-07-13: 3 mL via INTRAVENOUS

## 2015-07-13 MED ORDER — SODIUM CHLORIDE 0.9 % WEIGHT BASED INFUSION: 3.0000 mL/kg/h | INTRAVENOUS | Status: DC

## 2015-07-13 MED ORDER — ASPIRIN 81 MG PO CHEW: 81.0000 mg | CHEWABLE_TABLET | ORAL | Status: DC

## 2015-07-13 MED ORDER — ASPIRIN 81 MG PO CHEW
243.0000 mg | CHEWABLE_TABLET | Freq: Once | ORAL | Status: AC
  Administered 2015-07-13: 243 mg via ORAL

## 2015-07-13 MED ORDER — SODIUM CHLORIDE 0.9 % WEIGHT BASED INFUSION
1.0000 mL/kg/h | INTRAVENOUS | Status: DC
  Administered 2015-07-14: 1 mL/kg/h via INTRAVENOUS
  Administered 2015-07-14: 250 mL/h via INTRAVENOUS

## 2015-07-13 MED ORDER — ASPIRIN EC 81 MG PO TBEC
81.0000 mg | DELAYED_RELEASE_TABLET | Freq: Every day | ORAL | Status: DC
  Administered 2015-07-15: 81 mg via ORAL
  Filled 2015-07-13: qty 1

## 2015-07-13 MED ORDER — PANTOPRAZOLE SODIUM 40 MG PO TBEC
40.0000 mg | DELAYED_RELEASE_TABLET | Freq: Every day | ORAL | Status: DC
  Administered 2015-07-14 – 2015-07-15 (×2): 40 mg via ORAL
  Filled 2015-07-13 (×2): qty 1

## 2015-07-13 MED ORDER — PRAVASTATIN SODIUM 40 MG PO TABS
40.0000 mg | ORAL_TABLET | Freq: Every day | ORAL | Status: DC
  Administered 2015-07-13 – 2015-07-14 (×2): 40 mg via ORAL
  Filled 2015-07-13 (×3): qty 1

## 2015-07-13 MED ORDER — CARVEDILOL 12.5 MG PO TABS
12.5000 mg | ORAL_TABLET | Freq: Two times a day (BID) | ORAL | Status: DC
Start: 2015-07-14 — End: 2015-07-14
  Administered 2015-07-14: 12.5 mg via ORAL
  Filled 2015-07-13: qty 1

## 2015-07-13 MED ORDER — INSULIN ASPART 100 UNIT/ML ~~LOC~~ SOLN: 0.0000 [IU] | Freq: Three times a day (TID) | SUBCUTANEOUS | Status: DC

## 2015-07-13 MED ORDER — ACETAMINOPHEN 325 MG PO TABS
650.0000 mg | ORAL_TABLET | ORAL | Status: DC | PRN
  Administered 2015-07-14: 650 mg via ORAL
  Filled 2015-07-13: qty 2

## 2015-07-13 MED ORDER — VITAMIN D 1000 UNITS PO TABS
1000.0000 [IU] | ORAL_TABLET | Freq: Every day | ORAL | Status: DC
  Administered 2015-07-14 – 2015-07-15 (×2): 1000 [IU] via ORAL
  Filled 2015-07-13 (×2): qty 1

## 2015-07-13 MED ORDER — SODIUM CHLORIDE 0.9 % IV SOLN: 250.0000 mL | INTRAVENOUS | Status: DC | PRN

## 2015-07-13 MED ORDER — ALLOPURINOL 100 MG PO TABS
100.0000 mg | ORAL_TABLET | Freq: Every day | ORAL | Status: DC
  Administered 2015-07-14 – 2015-07-15 (×2): 100 mg via ORAL
  Filled 2015-07-13 (×3): qty 1

## 2015-07-13 MED ORDER — SODIUM CHLORIDE 0.9 % IJ SOLN: 3.0000 mL | INTRAMUSCULAR | Status: DC | PRN

## 2015-07-13 MED ORDER — CLOPIDOGREL BISULFATE 75 MG PO TABS
75.0000 mg | ORAL_TABLET | Freq: Every day | ORAL | Status: DC
  Administered 2015-07-14 – 2015-07-15 (×2): 75 mg via ORAL
  Filled 2015-07-13 (×2): qty 1

## 2015-07-13 MED ORDER — AMLODIPINE BESYLATE 5 MG PO TABS
2.5000 mg | ORAL_TABLET | Freq: Every day | ORAL | Status: DC
  Administered 2015-07-14: 2.5 mg via ORAL
  Filled 2015-07-13: qty 1

## 2015-07-13 MED ORDER — ASPIRIN 300 MG RE SUPP
300.0000 mg | RECTAL | Status: DC
Start: 2015-07-13 — End: 2015-07-13

## 2015-07-13 MED ORDER — POLYETHYLENE GLYCOL 3350 17 G PO PACK: 17.0000 g | PACK | Freq: Every day | ORAL | Status: DC | PRN

## 2015-07-13 MED ORDER — HEPARIN SODIUM (PORCINE) 5000 UNIT/ML IJ SOLN
5000.0000 [IU] | Freq: Three times a day (TID) | INTRAMUSCULAR | Status: DC
  Administered 2015-07-13 – 2015-07-15 (×3): 5000 [IU] via SUBCUTANEOUS
  Filled 2015-07-13 (×3): qty 1

## 2015-07-13 MED ORDER — NITROGLYCERIN 0.4 MG SL SUBL: 0.4000 mg | SUBLINGUAL_TABLET | SUBLINGUAL | Status: DC | PRN

## 2015-07-13 MED ORDER — ISOSORBIDE MONONITRATE ER 30 MG PO TB24
30.0000 mg | ORAL_TABLET | Freq: Every day | ORAL | Status: DC
  Administered 2015-07-13: 30 mg via ORAL
  Filled 2015-07-13: qty 1

## 2015-07-13 MED ORDER — ASPIRIN 81 MG PO CHEW
324.0000 mg | CHEWABLE_TABLET | ORAL | Status: DC
  Filled 2015-07-13: qty 4

## 2015-07-13 MED ORDER — ONDANSETRON HCL 4 MG/2ML IJ SOLN: 4.0000 mg | Freq: Four times a day (QID) | INTRAMUSCULAR | Status: DC | PRN

## 2015-07-13 MED ORDER — METHOTREXATE 2.5 MG PO TABS
7.5000 mg | ORAL_TABLET | ORAL | Status: DC
  Administered 2015-07-15: 7.5 mg via ORAL
  Filled 2015-07-13: qty 3

## 2015-07-13 MED ORDER — FOLIC ACID 1 MG PO TABS
1.0000 mg | ORAL_TABLET | Freq: Every day | ORAL | Status: DC
  Administered 2015-07-14 – 2015-07-15 (×2): 1 mg via ORAL
  Filled 2015-07-13 (×2): qty 1

## 2015-07-13 NOTE — ED Provider Notes (Signed)
CSN: [id number]     Arrival date & time 07/13/15  1133 History   First MD Initiated Contact with Patient 07/13/15 1211     Chief Complaint  Patient presents with  . Chest Pain   HPI  Sara Lynch is a 81 y.o. F PMH significant for HTN, AS, HLD, MI (x2) presenting with chest pain since Friday night. She states her chest pain started after she was putting clothes away. She describes her pain as 8/10 pain scale, burning, constant until she takes a nitro, non-exertional, similar she had to CP prior to receiving a stent. Since Friday night, she has had to use one nitro per day, which has been a change for her since April (stent placement). She developed chest pain last night, took a nitro, and has taken aspirin this morning. She denies fevers, chills, CP currently, SOB, N/V/D.   Last echo Jan 2016 with LVEF 55-60%  Past Medical History  Diagnosis Date  . Heart murmur   . Hyperlipidemia   . UTI (urinary tract infection)   . Hypertension   . Myocardial infarction ([place])     times 2  . Breast cancer ([place])     81 yo: s/p radical mastectomy  . Polymyositis ([place])     Methotrexate x years (Dr. Lenna Gilford is rheum as of 2015)  . Gout    Past Surgical History  Procedure Laterality Date  . Abdominal hysterectomy    . Cataract extraction Bilateral   . Mastectomy, radical Left   . Left heart catheterization with coronary angiogram N/A 11/17/2014    Procedure: LEFT HEART CATHETERIZATION WITH CORONARY ANGIOGRAM;  Surgeon: Peter M Martinique, MD;  Location: [facility] CATH LAB;  Service: Cardiovascular;  Laterality: N/A;  . Orif humerus fracture Left 12/17/2014    Procedure: OPEN REDUCTION INTERNAL FIXATION (ORIF) LEFT PROXIMAL HUMERUS FRACTURE;  Surgeon: Leandrew Koyanagi, MD;  Location: [place];  Service: Orthopedics;  Laterality: Left;  . Appendectomy     Family History  Problem Relation Age of Onset  . Cancer Mother     breast  . Hypertension Daughter   . Hyperlipidemia Daughter    Social History  Substance Use  Topics  . Smoking status: Never Smoker   . Smokeless tobacco: None  . Alcohol Use: No   OB History    No data available     Review of Systems  Ten systems are reviewed and are negative for acute change except as noted in the HPI   Allergies  Enalapril maleate; Oxycodone; Codeine; Penicillins; and Fosamax  Home Medications   Prior to Admission medications   Medication Sig Start Date End Date Taking? Authorizing Provider  allopurinol (ZYLOPRIM) 100 MG tablet Take 100 mg by mouth daily. 06/08/15   Historical Provider, MD  amLODipine (NORVASC) 2.5 MG tablet TAKE ONE TABLET (2.5 MG TOTAL) BY MOUTH DAILY. MUST SCHEDULE APPOINTMENT FOR FURTHER REFILLS 06/22/15   Historical Provider, MD  benzonatate (TESSALON) 100 MG capsule Take 1 capsule (100 mg total) by mouth 2 (two) times daily as needed for cough. 06/29/15   Renee A Kuneff, DO  carvedilol (COREG) 12.5 MG tablet Take 12.5 mg by mouth 2 (two) times daily with a meal.  06/13/15   Historical Provider, MD  clopidogrel (PLAVIX) 75 MG tablet Take 1 tablet (75 mg total) by mouth daily with breakfast. 11/19/14   Brett Canales, PA-C  doxycycline (VIBRA-TABS) 100 MG tablet Take 1 tablet (100 mg total) by mouth 2 (two) times daily. 06/26/15  Renee A Kuneff, DO  folic acid (FOLVITE) 1 MG tablet Take 1 mg by mouth daily.    Historical Provider, MD  furosemide (LASIX) 20 MG tablet Take 20 mg by mouth as needed for fluid or edema.     Historical Provider, MD  methotrexate 2.5 MG tablet Take 7.5 mg by mouth once a week. On Wednesdays    Historical Provider, MD  nitroGLYCERIN (NITROSTAT) 0.4 MG SL tablet Place 1 tablet (0.4 mg total) under the tongue every 5 (five) minutes as needed for chest pain (no more than 3 doses). 07/10/15   Josue Hector, MD  pantoprazole (PROTONIX) 40 MG tablet Take 1 tablet (40 mg total) by mouth daily. 05/14/15   Josue Hector, MD  polyethylene glycol [facility] / GLYCOLAX) packet Take 17 g by mouth daily as needed for mild  constipation.    Historical Provider, MD  pravastatin (PRAVACHOL) 40 MG tablet Take 1 tablet (40 mg total) by mouth daily. 04/03/15   Josue Hector, MD   BP 164/77 mmHg  Pulse 80  Temp(Src) 98.2 F (36.8 C) (Oral)  Resp 21  Ht 5\' 7"  (1.702 m)  Wt 59.932 kg  BMI 20.69 kg/m2  SpO2 96% Physical Exam  Constitutional: She appears well-developed and well-nourished. No distress.  HENT:  Head: Normocephalic and atraumatic.  Mouth/Throat: Oropharynx is clear and moist. No oropharyngeal exudate.  Eyes: Conjunctivae are normal. Pupils are equal, round, and reactive to light. Right eye exhibits no discharge. Left eye exhibits no discharge. No scleral icterus.  Neck: No tracheal deviation present.  Cardiovascular: Normal rate, regular rhythm and intact distal pulses.  Exam reveals no gallop and no friction rub.   Murmur heard. 3/6 systolic murmur with radiation to carotids BL.  Pulmonary/Chest: Effort normal and breath sounds normal. No respiratory distress. She has no wheezes. She has no rales. She exhibits no tenderness.  Abdominal: Soft. Bowel sounds are normal. She exhibits no distension and no mass. There is no tenderness. There is no rebound and no guarding.  Musculoskeletal: She exhibits no edema.  Lymphadenopathy:    She has no cervical adenopathy.  Neurological: She is alert. Coordination normal.  Skin: Skin is warm and dry. No rash noted. She is not diaphoretic. No erythema.  Psychiatric: She has a normal mood and affect. Her behavior is normal.  Nursing note and vitals reviewed.   ED Course  Procedures (including critical care time) Labs Review Labs Reviewed  BASIC METABOLIC PANEL - Abnormal; Notable for the following:    Glucose, Bld 276 (*)    BUN 21 (*)    Creatinine, Ser 1.14 (*)    GFR calc non Af Amer 43 (*)    GFR calc Af Amer 50 (*)    All other components within normal limits  CBC - Abnormal; Notable for the following:    RBC 3.31 (*)    Hemoglobin 11.5 (*)    HCT  33.8 (*)    MCV 102.1 (*)    MCH 34.7 (*)    All other components within normal limits  I-STAT TROPOININ, ED    Imaging Review Dg Chest 2 View  07/13/2015  CLINICAL DATA:  Chest pain starting Tuesday EXAM: CHEST  2 VIEW COMPARISON:  12/13/2014. FINDINGS: Mild hyperinflation. Cardiomediastinal silhouette is stable. Again noted streaky scarring in right upper lobe peripheral. No acute infiltrate or pleural effusion. No pulmonary edema. Osteopenia and mild degenerative changes thoracic spine. Metallic fixation material noted left proximal humerus. IMPRESSION: No active cardiopulmonary  disease. Mild hyperinflation. Stable streaky scarring in right upper lobe. Electronically Signed   By: Lahoma Crocker M.D.   On: 07/13/2015 12:25   I have personally reviewed and evaluated these images and lab results as part of my medical decision-making.   EKG Interpretation   Date/Time:  Monday July 13 2015 11:36:59 EST Ventricular Rate:  83 PR Interval:  170 QRS Duration: 74 QT Interval:  368 QTC Calculation: 432 R Axis:   43 Text Interpretation:  Normal sinus rhythm Septal infarct , age  undetermined Abnormal ECG no acute change from previous Confirmed by  Johnney Killian, MD, Jeannie Done [phone]) on 07/13/2015 2:42:06 PM      MDM   Final diagnoses:  Pulmonary mass   Patient non-toxic appearing and VSS. Based on patient history and physical exam, most likely etiologies include ACS vs MSK vs CHF.  Less likely etiologies include Prinzmetal's/cocaine-induced angina, pericarditis/pericardial effusion, cardiac tamponade, constrictive pericarditis, myocarditis, aortic dissection, thoracic aortic aneurysm, pneumonia, pleuritis, pneumothorax, tension pneumothorax, pulmonary embolism, pulmonary HTN, GERD, esophageal spasm, Mallory-Weiss tear, Boerhaave syndrome, peptic ulcer diease, biliary disease, pancreatitis, herpes zoster, anxiety, sickle cell chest crisis. Given patient history, she will need to be admitted for  chest pain rule out.  Cardiology consulted and advised admission. Patient may safely be admitted.    [place] Lions, PA-C 07/16/15 1525  Charlesetta Shanks, MD 08/01/15 [phone]

## 2015-07-13 NOTE — ED Notes (Signed)
Attempted report 

## 2015-07-13 NOTE — Progress Notes (Signed)
PATIENT ARRIVED TO UNIT 2W FROM E.D. VIA STRETCHER WITH TRANSPORTER AND FAMILY.  ASSISTED TO BED. TELE APPLIED. VITALS OBTAINED. ASSESSMENT COMPLETED.  PATIENT ORIENTED TO UNIT AND EQUIPMENT. INSTRUCTED TO CALL FOR ASSISTANCE WHEN NEEDED.
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
## 2015-07-13 NOTE — ED Notes (Signed)
Pt here for chets pain that has been coming and going since Friday. sts she has been taking nitro with relief. sts last nitro was last night and ASA this morning. Denies chest pain this am and no SOB.

## 2015-07-13 NOTE — Telephone Encounter (Signed)
Spoke with pt's daughter.  She reports pt has had daily chest pain for last 5 days.  Goes away with one NTG.  Occurs with exertion such as walking to car.  Prior to last week she was not experiencing chest pain.  Has been coughing and recently treated for bronchitis. Not having chest pain at this time. I recommended pt go to ED for evaluation.  Daughter is agreeable with this plan and will take pt to [facility].

## 2015-07-13 NOTE — Telephone Encounter (Signed)
Follow up     Pt c/o of Chest Pain: STAT if CP now or developed within 24 hours  1. Are you having CP right now? no 2. Are you experiencing any other symptoms (ex. SOB, nausea, vomiting, sweating)? no 3. How long have you been experiencing CP? Chest pain daily for the last 5 days 4. Is your CP continuous or coming and going? No pattern 5. Have you taken Nitroglycerin?  Pt takes nitro and chest pain is relieved.  She has taken 4 nitro in 4 days.  1 nitro relieves the pain Daughter want to know if pt should be seen sooner than her scheduled appt.  They are concerned that she is having chest pain often and want to make sure it is ok to take a nitro daily if need be.  Pt is scheduled to leave town tomorrow to go to [employer] for the holidays.  Please call today

## 2015-07-13 NOTE — ED Notes (Signed)
MD at bedside. 

## 2015-07-14 ENCOUNTER — Observation Stay (HOSPITAL_COMMUNITY): Payer: Medicare Other

## 2015-07-14 ENCOUNTER — Encounter (HOSPITAL_COMMUNITY)
Admission: EM | Disposition: A | Discharge status: Home / Self Care | Payer: Self-pay | Source: Home / Self Care | Attending: Cardiology

## 2015-07-14 DIAGNOSIS — I2510 Atherosclerotic heart disease of native coronary artery without angina pectoris: Secondary | ICD-10-CM | POA: Diagnosis not present

## 2015-07-14 DIAGNOSIS — Z7982 Long term (current) use of aspirin: Secondary | ICD-10-CM | POA: Diagnosis not present

## 2015-07-14 DIAGNOSIS — I25110 Atherosclerotic heart disease of native coronary artery with unstable angina pectoris: Principal | ICD-10-CM

## 2015-07-14 DIAGNOSIS — Y831 Surgical operation with implant of artificial internal device as the cause of abnormal reaction of the patient, or of later complication, without mention of misadventure at the time of the procedure: Secondary | ICD-10-CM | POA: Diagnosis present

## 2015-07-14 DIAGNOSIS — Z7902 Long term (current) use of antithrombotics/antiplatelets: Secondary | ICD-10-CM | POA: Diagnosis not present

## 2015-07-14 DIAGNOSIS — I38 Endocarditis, valve unspecified: Secondary | ICD-10-CM

## 2015-07-14 DIAGNOSIS — Z880 Allergy status to penicillin: Secondary | ICD-10-CM | POA: Diagnosis not present

## 2015-07-14 DIAGNOSIS — Z79899 Other long term (current) drug therapy: Secondary | ICD-10-CM | POA: Diagnosis not present

## 2015-07-14 DIAGNOSIS — I10 Essential (primary) hypertension: Secondary | ICD-10-CM | POA: Diagnosis not present

## 2015-07-14 DIAGNOSIS — E785 Hyperlipidemia, unspecified: Secondary | ICD-10-CM | POA: Diagnosis present

## 2015-07-14 DIAGNOSIS — M109 Gout, unspecified: Secondary | ICD-10-CM | POA: Diagnosis present

## 2015-07-14 DIAGNOSIS — R079 Chest pain, unspecified: Secondary | ICD-10-CM | POA: Diagnosis present

## 2015-07-14 DIAGNOSIS — N183 Chronic kidney disease, stage 3 (moderate): Secondary | ICD-10-CM | POA: Diagnosis present

## 2015-07-14 DIAGNOSIS — Z888 Allergy status to other drugs, medicaments and biological substances status: Secondary | ICD-10-CM | POA: Diagnosis not present

## 2015-07-14 DIAGNOSIS — I200 Unstable angina: Secondary | ICD-10-CM | POA: Diagnosis not present

## 2015-07-14 DIAGNOSIS — E1165 Type 2 diabetes mellitus with hyperglycemia: Secondary | ICD-10-CM | POA: Diagnosis not present

## 2015-07-14 DIAGNOSIS — Z853 Personal history of malignant neoplasm of breast: Secondary | ICD-10-CM | POA: Diagnosis not present

## 2015-07-14 DIAGNOSIS — I350 Nonrheumatic aortic (valve) stenosis: Secondary | ICD-10-CM | POA: Diagnosis not present

## 2015-07-14 DIAGNOSIS — Z9012 Acquired absence of left breast and nipple: Secondary | ICD-10-CM | POA: Diagnosis not present

## 2015-07-14 DIAGNOSIS — E1122 Type 2 diabetes mellitus with diabetic chronic kidney disease: Secondary | ICD-10-CM | POA: Diagnosis not present

## 2015-07-14 DIAGNOSIS — I252 Old myocardial infarction: Secondary | ICD-10-CM | POA: Diagnosis not present

## 2015-07-14 DIAGNOSIS — T82855A Stenosis of coronary artery stent, initial encounter: Secondary | ICD-10-CM | POA: Diagnosis present

## 2015-07-14 DIAGNOSIS — Z9842 Cataract extraction status, left eye: Secondary | ICD-10-CM | POA: Diagnosis not present

## 2015-07-14 DIAGNOSIS — F0390 Unspecified dementia without behavioral disturbance: Secondary | ICD-10-CM | POA: Diagnosis present

## 2015-07-14 DIAGNOSIS — Z7722 Contact with and (suspected) exposure to environmental tobacco smoke (acute) (chronic): Secondary | ICD-10-CM | POA: Diagnosis present

## 2015-07-14 DIAGNOSIS — Z8249 Family history of ischemic heart disease and other diseases of the circulatory system: Secondary | ICD-10-CM | POA: Diagnosis not present

## 2015-07-14 DIAGNOSIS — Z87891 Personal history of nicotine dependence: Secondary | ICD-10-CM | POA: Diagnosis not present

## 2015-07-14 DIAGNOSIS — M3320 Polymyositis, organ involvement unspecified: Secondary | ICD-10-CM | POA: Diagnosis not present

## 2015-07-14 DIAGNOSIS — Z9841 Cataract extraction status, right eye: Secondary | ICD-10-CM | POA: Diagnosis not present

## 2015-07-14 DIAGNOSIS — Z885 Allergy status to narcotic agent status: Secondary | ICD-10-CM | POA: Diagnosis not present

## 2015-07-14 DIAGNOSIS — I129 Hypertensive chronic kidney disease with stage 1 through stage 4 chronic kidney disease, or unspecified chronic kidney disease: Secondary | ICD-10-CM | POA: Diagnosis present

## 2015-07-14 DIAGNOSIS — R911 Solitary pulmonary nodule: Secondary | ICD-10-CM | POA: Diagnosis not present

## 2015-07-14 HISTORY — PX: CARDIAC CATHETERIZATION: SHX172

## 2015-07-14 LAB — GLUCOSE, CAPILLARY
GLUCOSE-CAPILLARY: 121 mg/dL — AB (ref 65–99)
GLUCOSE-CAPILLARY: 124 mg/dL — AB (ref 65–99)
Glucose-Capillary: 177 mg/dL — ABNORMAL HIGH (ref 65–99)

## 2015-07-14 LAB — COMPREHENSIVE METABOLIC PANEL
ALBUMIN: 2.9 g/dL — AB (ref 3.5–5.0)
ALK PHOS: 67 U/L (ref 38–126)
ALT: 12 U/L — ABNORMAL LOW (ref 14–54)
ANION GAP: 10 (ref 5–15)
AST: 16 U/L (ref 15–41)
BILIRUBIN TOTAL: 0.7 mg/dL (ref 0.3–1.2)
BUN: 18 mg/dL (ref 6–20)
CALCIUM: 9.3 mg/dL (ref 8.9–10.3)
CO2: 26 mmol/L (ref 22–32)
Chloride: 106 mmol/L (ref 101–111)
Creatinine, Ser: 1.01 mg/dL — ABNORMAL HIGH (ref 0.44–1.00)
GFR calc Af Amer: 58 mL/min — ABNORMAL LOW (ref >60)
GFR calc non Af Amer: 50 mL/min — ABNORMAL LOW (ref >60)
GLUCOSE: 146 mg/dL — AB (ref 65–99)
Potassium: 3.9 mmol/L (ref 3.5–5.1)
SODIUM: 142 mmol/L (ref 135–145)
TOTAL PROTEIN: 5.4 g/dL — AB (ref 6.5–8.1)

## 2015-07-14 LAB — LIPID PANEL
CHOL/HDL RATIO: 2.9 ratio
Cholesterol: 136 mg/dL (ref 0–200)
HDL: 47 mg/dL (ref >40)
LDL CALC: 63 mg/dL (ref 0–99)
Triglycerides: 132 mg/dL (ref <150)
VLDL: 26 mg/dL (ref 0–40)

## 2015-07-14 LAB — CBC
HEMATOCRIT: 33.7 % — AB (ref 36.0–46.0)
HEMOGLOBIN: 11.2 g/dL — AB (ref 12.0–15.0)
MCH: 34 pg (ref 26.0–34.0)
MCHC: 33.2 g/dL (ref 30.0–36.0)
MCV: 102.4 fL — ABNORMAL HIGH (ref 78.0–100.0)
Platelets: 261 10*3/uL (ref 150–400)
RBC: 3.29 MIL/uL — AB (ref 3.87–5.11)
RDW: 14 % (ref 11.5–15.5)
WBC: 7.3 10*3/uL (ref 4.0–10.5)

## 2015-07-14 LAB — TROPONIN I

## 2015-07-14 LAB — POCT ACTIVATED CLOTTING TIME
ACTIVATED CLOTTING TIME: 255 s
Activated Clotting Time: 270 seconds

## 2015-07-14 SURGERY — LEFT HEART CATH AND CORONARY ANGIOGRAPHY

## 2015-07-14 MED ORDER — CARVEDILOL 12.5 MG PO TABS
25.0000 mg | ORAL_TABLET | Freq: Two times a day (BID) | ORAL | Status: DC
  Administered 2015-07-14 – 2015-07-15 (×2): 25 mg via ORAL
  Filled 2015-07-14 (×2): qty 2

## 2015-07-14 MED ORDER — MIDAZOLAM HCL 2 MG/2ML IJ SOLN
INTRAMUSCULAR | Status: AC
  Filled 2015-07-14: qty 2

## 2015-07-14 MED ORDER — FENTANYL CITRATE (PF) 100 MCG/2ML IJ SOLN
INTRAMUSCULAR | Status: DC | PRN
  Administered 2015-07-14: 25 ug via INTRAVENOUS

## 2015-07-14 MED ORDER — HEPARIN SODIUM (PORCINE) 1000 UNIT/ML IJ SOLN
INTRAMUSCULAR | Status: AC
  Filled 2015-07-14: qty 1

## 2015-07-14 MED ORDER — LABETALOL HCL 5 MG/ML IV SOLN
INTRAVENOUS | Status: AC
  Filled 2015-07-14: qty 4

## 2015-07-14 MED ORDER — LIDOCAINE HCL (PF) 1 % IJ SOLN
INTRAMUSCULAR | Status: AC
  Filled 2015-07-14: qty 30

## 2015-07-14 MED ORDER — VERAPAMIL HCL 2.5 MG/ML IV SOLN
INTRAVENOUS | Status: AC
  Filled 2015-07-14: qty 2

## 2015-07-14 MED ORDER — SODIUM CHLORIDE 0.9 % IV SOLN: 250.0000 mL | INTRAVENOUS | Status: DC | PRN

## 2015-07-14 MED ORDER — SODIUM CHLORIDE 0.9 % IJ SOLN: 3.0000 mL | INTRAMUSCULAR | Status: DC | PRN

## 2015-07-14 MED ORDER — AMLODIPINE BESYLATE 5 MG PO TABS
ORAL_TABLET | ORAL | Status: AC
  Filled 2015-07-14: qty 1

## 2015-07-14 MED ORDER — HEPARIN (PORCINE) IN NACL 2-0.9 UNIT/ML-% IJ SOLN
INTRAMUSCULAR | Status: AC
  Filled 2015-07-14: qty 1000

## 2015-07-14 MED ORDER — NITROGLYCERIN 1 MG/10 ML FOR IR/CATH LAB
INTRA_ARTERIAL | Status: AC
  Filled 2015-07-14: qty 10

## 2015-07-14 MED ORDER — AMLODIPINE BESYLATE 5 MG PO TABS
5.0000 mg | ORAL_TABLET | Freq: Every day | ORAL | Status: DC
  Administered 2015-07-14 – 2015-07-15 (×2): 5 mg via ORAL
  Filled 2015-07-14: qty 1

## 2015-07-14 MED ORDER — FENTANYL CITRATE (PF) 100 MCG/2ML IJ SOLN
INTRAMUSCULAR | Status: AC
  Filled 2015-07-14: qty 2

## 2015-07-14 MED ORDER — IOHEXOL 350 MG/ML SOLN
INTRAVENOUS | Status: DC | PRN
  Administered 2015-07-14: 90 mL via INTRA_ARTERIAL

## 2015-07-14 MED ORDER — NITROGLYCERIN 1 MG/10 ML FOR IR/CATH LAB
INTRA_ARTERIAL | Status: DC | PRN
  Administered 2015-07-14: 13:00:00

## 2015-07-14 MED ORDER — ANGIOPLASTY BOOK
Freq: Once | Status: AC
  Administered 2015-07-14: 21:00:00
  Filled 2015-07-14: qty 1

## 2015-07-14 MED ORDER — SODIUM CHLORIDE 0.9 % IV SOLN: INTRAVENOUS | Status: AC

## 2015-07-14 MED ORDER — MIDAZOLAM HCL 2 MG/2ML IJ SOLN
INTRAMUSCULAR | Status: DC | PRN
  Administered 2015-07-14: 2 mg via INTRAVENOUS

## 2015-07-14 MED ORDER — SODIUM CHLORIDE 0.9 % IJ SOLN: 3.0000 mL | Freq: Two times a day (BID) | INTRAMUSCULAR | Status: DC

## 2015-07-14 MED ORDER — LABETALOL HCL 5 MG/ML IV SOLN
20.0000 mg | Freq: Once | INTRAVENOUS | Status: AC
  Administered 2015-07-14: 20 mg via INTRAVENOUS

## 2015-07-14 MED ORDER — VERAPAMIL HCL 2.5 MG/ML IV SOLN
INTRAVENOUS | Status: DC | PRN
  Administered 2015-07-14: 12:00:00 via INTRA_ARTERIAL

## 2015-07-14 MED ORDER — HEPARIN SODIUM (PORCINE) 1000 UNIT/ML IJ SOLN
INTRAMUSCULAR | Status: DC | PRN
  Administered 2015-07-14: 2000 [IU] via INTRAVENOUS
  Administered 2015-07-14 (×2): 3000 [IU] via INTRAVENOUS

## 2015-07-14 SURGICAL SUPPLY — 24 items
BALLN ANGIOSCULPT RX 3.5X10 (BALLOONS) ×3
BALLN EUPHORA RX 2.5X15 (BALLOONS) ×3
BALLN ~~LOC~~ EUPHORA RX 3.5X12 (BALLOONS) ×3
BALLN ~~LOC~~ EUPHORA RX 4.0X15 (BALLOONS) ×3
BALLOON ANGIOSCULPT RX 3.5X10 (BALLOONS) ×1 IMPLANT
BALLOON EUPHORA RX 2.5X15 (BALLOONS) ×1 IMPLANT
BALLOON ~~LOC~~ EUPHORA RX 3.5X12 (BALLOONS) ×1 IMPLANT
BALLOON ~~LOC~~ EUPHORA RX 4.0X15 (BALLOONS) ×1 IMPLANT
CATH INFINITI 5 FR JR3.5 (CATHETERS) ×3 IMPLANT
CATH INFINITI 5FR ANG PIGTAIL (CATHETERS) ×3 IMPLANT
CATH INFINITI JR4 5F (CATHETERS) ×3 IMPLANT
CATH OPTICROSS 40MHZ (CATHETERS) ×3 IMPLANT
CATH VISTA GUIDE 6FR JR4 (CATHETERS) ×3 IMPLANT
DEVICE RAD COMP TR BAND LRG (VASCULAR PRODUCTS) ×3 IMPLANT
GLIDESHEATH SLEND A-KIT 6F 22G (SHEATH) ×3 IMPLANT
KIT ENCORE 26 ADVANTAGE (KITS) ×3 IMPLANT
KIT HEART LEFT (KITS) ×3 IMPLANT
PACK CARDIAC CATHETERIZATION (CUSTOM PROCEDURE TRAY) ×3 IMPLANT
SLED PULL BACK IVUS (MISCELLANEOUS) ×3 IMPLANT
TRANSDUCER W/STOPCOCK (MISCELLANEOUS) ×3 IMPLANT
TUBING CIL FLEX 10 FLL-RA (TUBING) ×3 IMPLANT
WIRE COUGAR XT STRL 190CM (WIRE) ×3 IMPLANT
WIRE HI TORQ VERSACORE-J 145CM (WIRE) ×3 IMPLANT
WIRE SAFE-T 1.5MM-J .035X260CM (WIRE) ×3 IMPLANT

## 2015-07-14 NOTE — Interval H&P Note (Signed)
Cath Lab Visit (complete for each Cath Lab visit)  Clinical Evaluation Leading to the Procedure:   ACS: Yes.    Non-ACS:    Anginal Classification: CCS IV  Anti-ischemic medical therapy: Maximal Therapy (2 or more classes of medications)  Non-Invasive Test Results: No non-invasive testing performed  Prior CABG: No previous CABG      History and Physical Interval Note:  07/14/2015 11:31 AM  Sara Lynch  has presented today for surgery, with the diagnosis of cp  The various methods of treatment have been discussed with the patient and family. After consideration of risks, benefits and other options for treatment, the patient has consented to  Procedure(s): Left Heart Cath and Coronary Angiography (N/A) as a surgical intervention .  The patient's history has been reviewed, patient examined, no change in status, stable for surgery.  I have reviewed the patient's chart and labs.  Questions were answered to the patient's satisfaction.     Sherren Mocha

## 2015-07-14 NOTE — H&P (View-Only) (Signed)
SUBJECTIVE:  No complaints  OBJECTIVE:   Vitals:   Filed Vitals:   07/13/15 1830 07/13/15 1918 07/13/15 2047 07/14/15 0700  BP: 152/90 154/76 191/81 155/80  Pulse: 63 80 81 78  Temp:   97.6 F (36.4 C) 97.5 F (36.4 C)  TempSrc:   Oral Oral  Resp: 17 16 18 18   Height:   5\' 7"  (1.702 m)   Weight:   128 lb (58.06 kg)   SpO2: 100% 94% 96% 95%   I&O's:   Intake/Output Summary (Last 24 hours) at 07/14/15 0830 Last data filed at 07/14/15 0032  Gross per 24 hour  Intake    240 ml  Output    600 ml  Net   -360 ml   TELEMETRY: Reviewed telemetry pt in NSR:     PHYSICAL EXAM General: Well developed, well nourished, in no acute distress Head: Eyes PERRLA, No xanthomas.   Normal cephalic and atramatic  Lungs:   Clear bilaterally to auscultation and percussion. Heart:   HRRR S1 S2 Pulses are 2+ & equal. Abdomen: Bowel sounds are positive, abdomen soft and non-tender without masses Extremities:   No clubbing, cyanosis or edema.  DP +1 Neuro: Alert and oriented X 3. Psych:  Good affect, responds appropriately   LABS: Basic Metabolic Panel:  Recent Labs  07/13/15 1147  NA 139  K 3.9  CL 106  CO2 24  GLUCOSE 276*  BUN 21*  CREATININE 1.14*  CALCIUM 9.3   Liver Function Tests: No results for input(s): AST, ALT, ALKPHOS, BILITOT, PROT, ALBUMIN in the last 72 hours. No results for input(s): LIPASE, AMYLASE in the last 72 hours. CBC:  Recent Labs  07/13/15 1147  WBC 6.5  HGB 11.5*  HCT 33.8*  MCV 102.1*  PLT 271   Cardiac Enzymes:  Recent Labs  07/13/15 2049 07/14/15 0138  TROPONINI <0.03 <0.03   BNP: Invalid input(s): POCBNP D-Dimer: No results for input(s): DDIMER in the last 72 hours. Hemoglobin A1C: No results for input(s): HGBA1C in the last 72 hours. Fasting Lipid Panel:  Recent Labs  07/14/15 0138  CHOL 136  HDL 47  LDLCALC 63  TRIG 132  CHOLHDL 2.9   Thyroid Function Tests:  Recent Labs  07/13/15 2049  TSH 2.167   Anemia  Panel: No results for input(s): VITAMINB12, FOLATE, FERRITIN, TIBC, IRON, RETICCTPCT in the last 72 hours. Coag Panel:   Lab Results  Component Value Date   INR 1.07 07/13/2015   INR 1.18 12/13/2014   INR 1.14 11/16/2014    RADIOLOGY: Dg Chest 2 View  07/13/2015  CLINICAL DATA:  Chest pain starting Tuesday EXAM: CHEST  2 VIEW COMPARISON:  12/13/2014. FINDINGS: Mild hyperinflation. Cardiomediastinal silhouette is stable. Again noted streaky scarring in right upper lobe peripheral. No acute infiltrate or pleural effusion. No pulmonary edema. Osteopenia and mild degenerative changes thoracic spine. Metallic fixation material noted left proximal humerus. IMPRESSION: No active cardiopulmonary disease. Mild hyperinflation. Stable streaky scarring in right upper lobe. Electronically Signed   By: Lahoma Crocker M.D.   On: 07/13/2015 12:25   Ct Chest Wo Contrast  07/13/2015  CLINICAL DATA:  81 year old female with intermittent chest pain since this past Friday EXAM: CT CHEST WITHOUT CONTRAST TECHNIQUE: Multidetector CT imaging of the chest was performed following the standard protocol without IV contrast. COMPARISON:  Prior CT scan of the chest 12/13/2014 FINDINGS: Mediastinum: Unremarkable CT appearance of the thyroid gland. No suspicious mediastinal or hilar adenopathy. No soft tissue mediastinal mass. Small  hiatal hernia. Heart/Vascular: Limited evaluation in the absence of intravenous contrast. Three vessel arch anatomy. Elongation of the aortic isthmus and proximal descending thoracic aorta. Scattered atherosclerotic vascular calcifications but no evidence of acute intramural hematoma or aneurysm. Calcifications present along the aortic valve cusps. The heart is within normal limits for size. No pericardial effusion. Calcifications present along the course of the coronary arteries. There is a stent in the proximal right coronary artery. Lungs/Pleura: Overall, there has been significant interval progression  of a numerous bilateral pulmonary nodules which appear largely peribronchovascular in configuration with areas of micro and macro tree-in-bud nodularity. The regions of greatest increase are in the lower lobes bilaterally. The previously noted dominant lesion in the posterior aspect of the right upper lobe has not changed in size and appears more scar-like on today's examination. On today's evaluation, the largest conglomerations of macro nodularity are present in the left lower lobe. If measured in aggregate, the 2 largest examples measure 3.5 x 1.9 cm and 2.8 x 1.3 cm respectively. Faint band of radiation fibrosis again noted in the anterior left upper lobe. No evidence of pulmonary edema, pleural effusion or pneumothorax. Bones/Soft Tissues: No acute fracture or aggressive appearing lytic or blastic osseous lesion. Incompletely imaged surgical changes in the left humerus. Stable benign-appearing sclerotic focus in the medial head of the left clavicle likely a small enchondroma. Surgical changes of prior left mastectomy. Upper Abdomen: Visualized upper abdominal organs are unremarkable. IMPRESSION: 1. Overall, significant interval progression of diffuse bilateral peribronchovascular micro and macro nodularity. The regions of greatest progression are in the lower lobes bilaterally. This imaging appearance is most suggestive of a progressive indolent atypical infection such as MAI. Multifocal endobronchial spread of tumor is a less likely consideration. Recommend non emergent pulmonology consultation. 2. Atherosclerosis including coronary artery calcifications. 3. Aortic valvular calcifications. If there is clinical concern for underlying aortic valve pathology suggest stenosis or regurgitation consider further evaluation with echocardiography. 4. Additional ancillary findings as above without significant interval change. Electronically Signed   By: Jacqulynn Cadet M.D.   On: 07/13/2015 19:21   ASSESSMENT AND  PLAN:   1. Chest pain concerning for [place] with prior history of CAD as above - history is concerning for recurrent angina. BP is also elevated which could be a culprit.  Imdur 30mg  daily added yesterday but she is having a bad headache from it so will stop for now. Increasing Coreg today.  Troponins neg x 4.  Risks/benefits including bleeding, infection, kidney damage, stroke, heart attack, death, recurrent complication as in 123XX123 have been discussed with the patient. The patient understands these risks and is willing to proceed. Would avoid LV gram since we plan to get EF by echo anyway and due to CKD.  Continue ASA/statin/plavix/BB  2. Essential HTN, still elevated - increase Coreg to 25mg  BID.  3. CKD stage III, appears at baseline - follow.  4. H/o pulmonary nodule by CT 11/2014 - This was due over the summer but she never had this done. CT yesterday showed significant interval progression of diffuse bilateral peribronchovascular micro and macro nodularity. The regions of greatest progression are in the lower lobes bilaterally. This imaging appearance is most suggestive of a progressive indolent atypical infection such as MAI. Multifocal endobronchial spread of tumor is a less likely consideration.  Will get pulmonary consult.   5. Hyperglycemia - check A1C.  6. Valvular heart disease - mild AS/AI/MR by echo 08-04-14. Murmur is quite pronounced. Will repeat echo.  7.  Dyslipidemia - LDL at goal.  Continue statin.     Sueanne Margarita, MD  07/14/2015  8:30 AM

## 2015-07-14 NOTE — Progress Notes (Signed)
SUBJECTIVE:  No complaints  OBJECTIVE:   Vitals:   Filed Vitals:   07/13/15 1830 07/13/15 1918 07/13/15 2047 07/14/15 0700  BP: 152/90 154/76 191/81 155/80  Pulse: 63 80 81 78  Temp:   97.6 F (36.4 C) 97.5 F (36.4 C)  TempSrc:   Oral Oral  Resp: 17 16 18 18   Height:   5\' 7"  (1.702 m)   Weight:   128 lb (58.06 kg)   SpO2: 100% 94% 96% 95%   I&O's:   Intake/Output Summary (Last 24 hours) at 07/14/15 0830 Last data filed at 07/14/15 0032  Gross per 24 hour  Intake    240 ml  Output    600 ml  Net   -360 ml   TELEMETRY: Reviewed telemetry pt in NSR:     PHYSICAL EXAM General: Well developed, well nourished, in no acute distress Head: Eyes PERRLA, No xanthomas.   Normal cephalic and atramatic  Lungs:   Clear bilaterally to auscultation and percussion. Heart:   HRRR S1 S2 Pulses are 2+ & equal. Abdomen: Bowel sounds are positive, abdomen soft and non-tender without masses Extremities:   No clubbing, cyanosis or edema.  DP +1 Neuro: Alert and oriented X 3. Psych:  Good affect, responds appropriately   LABS: Basic Metabolic Panel:  Recent Labs  07/13/15 1147  NA 139  K 3.9  CL 106  CO2 24  GLUCOSE 276*  BUN 21*  CREATININE 1.14*  CALCIUM 9.3   Liver Function Tests: No results for input(s): AST, ALT, ALKPHOS, BILITOT, PROT, ALBUMIN in the last 72 hours. No results for input(s): LIPASE, AMYLASE in the last 72 hours. CBC:  Recent Labs  07/13/15 1147  WBC 6.5  HGB 11.5*  HCT 33.8*  MCV 102.1*  PLT 271   Cardiac Enzymes:  Recent Labs  07/13/15 2049 07/14/15 0138  TROPONINI <0.03 <0.03   BNP: Invalid input(s): POCBNP D-Dimer: No results for input(s): DDIMER in the last 72 hours. Hemoglobin A1C: No results for input(s): HGBA1C in the last 72 hours. Fasting Lipid Panel:  Recent Labs  07/14/15 0138  CHOL 136  HDL 47  LDLCALC 63  TRIG 132  CHOLHDL 2.9   Thyroid Function Tests:  Recent Labs  07/13/15 2049  TSH 2.167   Anemia  Panel: No results for input(s): VITAMINB12, FOLATE, FERRITIN, TIBC, IRON, RETICCTPCT in the last 72 hours. Coag Panel:   Lab Results  Component Value Date   INR 1.07 07/13/2015   INR 1.18 12/13/2014   INR 1.14 11/16/2014    RADIOLOGY: Dg Chest 2 View  07/13/2015  CLINICAL DATA:  Chest pain starting Tuesday EXAM: CHEST  2 VIEW COMPARISON:  12/13/2014. FINDINGS: Mild hyperinflation. Cardiomediastinal silhouette is stable. Again noted streaky scarring in right upper lobe peripheral. No acute infiltrate or pleural effusion. No pulmonary edema. Osteopenia and mild degenerative changes thoracic spine. Metallic fixation material noted left proximal humerus. IMPRESSION: No active cardiopulmonary disease. Mild hyperinflation. Stable streaky scarring in right upper lobe. Electronically Signed   By: Lahoma Crocker M.D.   On: 07/13/2015 12:25   Ct Chest Wo Contrast  07/13/2015  CLINICAL DATA:  81 year old female with intermittent chest pain since this past Friday EXAM: CT CHEST WITHOUT CONTRAST TECHNIQUE: Multidetector CT imaging of the chest was performed following the standard protocol without IV contrast. COMPARISON:  Prior CT scan of the chest 12/13/2014 FINDINGS: Mediastinum: Unremarkable CT appearance of the thyroid gland. No suspicious mediastinal or hilar adenopathy. No soft tissue mediastinal mass. Small  hiatal hernia. Heart/Vascular: Limited evaluation in the absence of intravenous contrast. Three vessel arch anatomy. Elongation of the aortic isthmus and proximal descending thoracic aorta. Scattered atherosclerotic vascular calcifications but no evidence of acute intramural hematoma or aneurysm. Calcifications present along the aortic valve cusps. The heart is within normal limits for size. No pericardial effusion. Calcifications present along the course of the coronary arteries. There is a stent in the proximal right coronary artery. Lungs/Pleura: Overall, there has been significant interval progression  of a numerous bilateral pulmonary nodules which appear largely peribronchovascular in configuration with areas of micro and macro tree-in-bud nodularity. The regions of greatest increase are in the lower lobes bilaterally. The previously noted dominant lesion in the posterior aspect of the right upper lobe has not changed in size and appears more scar-like on today's examination. On today's evaluation, the largest conglomerations of macro nodularity are present in the left lower lobe. If measured in aggregate, the 2 largest examples measure 3.5 x 1.9 cm and 2.8 x 1.3 cm respectively. Faint band of radiation fibrosis again noted in the anterior left upper lobe. No evidence of pulmonary edema, pleural effusion or pneumothorax. Bones/Soft Tissues: No acute fracture or aggressive appearing lytic or blastic osseous lesion. Incompletely imaged surgical changes in the left humerus. Stable benign-appearing sclerotic focus in the medial head of the left clavicle likely a small enchondroma. Surgical changes of prior left mastectomy. Upper Abdomen: Visualized upper abdominal organs are unremarkable. IMPRESSION: 1. Overall, significant interval progression of diffuse bilateral peribronchovascular micro and macro nodularity. The regions of greatest progression are in the lower lobes bilaterally. This imaging appearance is most suggestive of a progressive indolent atypical infection such as MAI. Multifocal endobronchial spread of tumor is a less likely consideration. Recommend non emergent pulmonology consultation. 2. Atherosclerosis including coronary artery calcifications. 3. Aortic valvular calcifications. If there is clinical concern for underlying aortic valve pathology suggest stenosis or regurgitation consider further evaluation with echocardiography. 4. Additional ancillary findings as above without significant interval change. Electronically Signed   By: Jacqulynn Cadet M.D.   On: 07/13/2015 19:21   ASSESSMENT AND  PLAN:   1. Chest pain concerning for [place] with prior history of CAD as above - history is concerning for recurrent angina. BP is also elevated which could be a culprit.  Imdur 30mg  daily added yesterday but she is having a bad headache from it so will stop for now. Increasing Coreg today.  Troponins neg x 4.  Risks/benefits including bleeding, infection, kidney damage, stroke, heart attack, death, recurrent complication as in 123XX123 have been discussed with the patient. The patient understands these risks and is willing to proceed. Would avoid LV gram since we plan to get EF by echo anyway and due to CKD.  Continue ASA/statin/plavix/BB  2. Essential HTN, still elevated - increase Coreg to 25mg  BID.  3. CKD stage III, appears at baseline - follow.  4. H/o pulmonary nodule by CT 11/2014 - This was due over the summer but she never had this done. CT yesterday showed significant interval progression of diffuse bilateral peribronchovascular micro and macro nodularity. The regions of greatest progression are in the lower lobes bilaterally. This imaging appearance is most suggestive of a progressive indolent atypical infection such as MAI. Multifocal endobronchial spread of tumor is a less likely consideration.  Will get pulmonary consult.   5. Hyperglycemia - check A1C.  6. Valvular heart disease - mild AS/AI/MR by echo 09-Aug-2014. Murmur is quite pronounced. Will repeat echo.  7.  Dyslipidemia - LDL at goal.  Continue statin.     Sueanne Margarita, MD  07/14/2015  8:30 AM

## 2015-07-15 ENCOUNTER — Inpatient Hospital Stay (HOSPITAL_COMMUNITY): Payer: Medicare Other

## 2015-07-15 ENCOUNTER — Telehealth: Payer: Self-pay | Admitting: Emergency Medicine

## 2015-07-15 ENCOUNTER — Encounter (HOSPITAL_COMMUNITY): Payer: Self-pay | Admitting: Cardiovascular Disease

## 2015-07-15 DIAGNOSIS — R911 Solitary pulmonary nodule: Secondary | ICD-10-CM

## 2015-07-15 DIAGNOSIS — I350 Nonrheumatic aortic (valve) stenosis: Secondary | ICD-10-CM

## 2015-07-15 LAB — CBC
HEMATOCRIT: 31.3 % — AB (ref 36.0–46.0)
HEMOGLOBIN: 10.8 g/dL — AB (ref 12.0–15.0)
MCH: 35.3 pg — ABNORMAL HIGH (ref 26.0–34.0)
MCHC: 34.5 g/dL (ref 30.0–36.0)
MCV: 102.3 fL — AB (ref 78.0–100.0)
Platelets: 236 10*3/uL (ref 150–400)
RBC: 3.06 MIL/uL — AB (ref 3.87–5.11)
RDW: 14.2 % (ref 11.5–15.5)
WBC: 6.7 10*3/uL (ref 4.0–10.5)

## 2015-07-15 LAB — GLUCOSE, CAPILLARY
GLUCOSE-CAPILLARY: 130 mg/dL — AB (ref 65–99)
Glucose-Capillary: 117 mg/dL — ABNORMAL HIGH (ref 65–99)

## 2015-07-15 LAB — HEMOGLOBIN A1C
HEMOGLOBIN A1C: 7 % — AB (ref 4.8–5.6)
MEAN PLASMA GLUCOSE: 154 mg/dL

## 2015-07-15 LAB — BASIC METABOLIC PANEL
ANION GAP: 8 (ref 5–15)
BUN: 17 mg/dL (ref 6–20)
CHLORIDE: 107 mmol/L (ref 101–111)
CO2: 26 mmol/L (ref 22–32)
Calcium: 9 mg/dL (ref 8.9–10.3)
Creatinine, Ser: 1.02 mg/dL — ABNORMAL HIGH (ref 0.44–1.00)
GFR calc non Af Amer: 49 mL/min — ABNORMAL LOW (ref >60)
GFR, EST AFRICAN AMERICAN: 57 mL/min — AB (ref >60)
Glucose, Bld: 133 mg/dL — ABNORMAL HIGH (ref 65–99)
POTASSIUM: 3.9 mmol/L (ref 3.5–5.1)
SODIUM: 141 mmol/L (ref 135–145)

## 2015-07-15 MED ORDER — AMLODIPINE BESYLATE 5 MG PO TABS: 5.0000 mg | ORAL_TABLET | Freq: Every day | ORAL | Status: DC

## 2015-07-15 MED ORDER — CARVEDILOL 25 MG PO TABS: 25.0000 mg | ORAL_TABLET | Freq: Two times a day (BID) | ORAL | Status: DC

## 2015-07-15 NOTE — Telephone Encounter (Signed)
Ok let's do it asap p the first of the year as an add on - Tammy Rosana Hoes will be opening the Thursday pm schedules soons

## 2015-07-15 NOTE — Progress Notes (Signed)
Patient Name: Sara Lynch Date of Encounter: 07/15/2015   SUBJECTIVE  Feeling well. No chest pain, sob or palpitations.   CURRENT MEDS . allopurinol  100 mg Oral Daily  . amLODipine  5 mg Oral Daily  . aspirin EC  81 mg Oral Daily  . carvedilol  25 mg Oral BID WC  . cholecalciferol  1,000 Units Oral Daily  . clopidogrel  75 mg Oral Daily  . folic acid  1 mg Oral Daily  . heparin  5,000 Units Subcutaneous 3 times per day  . insulin aspart  0-9 Units Subcutaneous TID WC  . methotrexate  7.5 mg Oral Weekly  . pantoprazole  40 mg Oral Daily  . pravastatin  40 mg Oral Daily  . sodium chloride  3 mL Intravenous Q12H    OBJECTIVE  Filed Vitals:   07/14/15 1700 07/14/15 2000 07/14/15 2002 07/15/15 0157  BP: 166/63  148/60 131/48  Pulse:   80 79  Temp:   97.7 F (36.5 C) 98.3 F (36.8 C)  TempSrc:   Oral Oral  Resp: 16 17 18 16   Height:      Weight:    130 lb 1.1 oz (59 kg)  SpO2:   96% 94%    Intake/Output Summary (Last 24 hours) at 07/15/15 0801 Last data filed at 07/14/15 1848  Gross per 24 hour  Intake 885.83 ml  Output      0 ml  Net 885.83 ml   Filed Weights   07/13/15 1149 07/13/15 2047 07/15/15 0157  Weight: 132 lb 2 oz (59.932 kg) 128 lb (58.06 kg) 130 lb 1.1 oz (59 kg)    PHYSICAL EXAM  General: Pleasant elderly woman in  NAD. Neuro: Alert and oriented X 3. Moves all extremities spontaneously. Psych: Normal affect. HEENT:  Normal  Neck: Supple without bruits or JVD. Lungs:  Resp regular and unlabored, CTA. Heart: RRR no s3, s4, II-III/IV systolic murmurs. Abdomen: Soft, non-tender, non-distended, BS + x 4.  Extremities: No clubbing, cyanosis or edema. DP/PT/Radials 2+ and equal bilaterally. R radial cath site without hematoma.   Accessory Clinical Findings  CBC  Recent Labs  07/14/15 0743 07/15/15 0420  WBC 7.3 6.7  HGB 11.2* 10.8*  HCT 33.7* 31.3*  MCV 102.4* 102.3*  PLT 261 [id number]   Basic Metabolic Panel  Recent Labs  07/14/15 0743  07/15/15 0420  NA 142 141  K 3.9 3.9  CL 106 107  CO2 26 26  GLUCOSE 146* 133*  BUN 18 17  CREATININE 1.01* 1.02*  CALCIUM 9.3 9.0   Liver Function Tests  Recent Labs  07/14/15 0743  AST 16  ALT 12*  ALKPHOS 67  BILITOT 0.7  PROT 5.4*  ALBUMIN 2.9*   Cardiac Enzymes  Recent Labs  07/13/15 2049 07/14/15 0138 07/14/15 0743  TROPONINI <0.03 <0.03 <0.03   Hemoglobin A1C  Recent Labs  07/13/15 2049  HGBA1C 7.0*   Fasting Lipid Panel  Recent Labs  07/14/15 0138  CHOL 136  HDL 47  LDLCALC 63  TRIG 132  CHOLHDL 2.9   Thyroid Function Tests  Recent Labs  07/13/15 2049  TSH 2.167    TELE  Sinus rhythm with PVCs  Procedures  07/14/2015   Coronary Balloon Angioplasty   Left Heart Cath and Coronary Angiography    Conclusion    1. Mild left main disease 2. Patency of the LAD and LCx 3. Severe RCA in-stent restenosis treated successfully with balloon angioplasty guided by IVUS.  4. Mild aortic stenosis  Recommend: long-term DAPT with ASA and plavix. If recurrent stenosis of the RCA considerations would include off-label drug-eluting balloon therapy versus referral for brachytherapy  Check 2D Echo to evaluate LV function and assess for aortic stenosis (mild in past, 20 mm peak to peak gradient by cath       Radiology/Studies  Dg Chest 2 View  07/13/2015  CLINICAL DATA:  Chest pain starting Tuesday EXAM: CHEST  2 VIEW COMPARISON:  12/13/2014. FINDINGS: Mild hyperinflation. Cardiomediastinal silhouette is stable. Again noted streaky scarring in right upper lobe peripheral. No acute infiltrate or pleural effusion. No pulmonary edema. Osteopenia and mild degenerative changes thoracic spine. Metallic fixation material noted left proximal humerus. IMPRESSION: No active cardiopulmonary disease. Mild hyperinflation. Stable streaky scarring in right upper lobe. Electronically Signed   By: Lahoma Crocker M.D.   On: 07/13/2015 12:25   Ct Chest Wo  Contrast  07/13/2015  CLINICAL DATA:  81 year old female with intermittent chest pain since this past Friday EXAM: CT CHEST WITHOUT CONTRAST TECHNIQUE: Multidetector CT imaging of the chest was performed following the standard protocol without IV contrast. COMPARISON:  Prior CT scan of the chest 12/13/2014 FINDINGS: Mediastinum: Unremarkable CT appearance of the thyroid gland. No suspicious mediastinal or hilar adenopathy. No soft tissue mediastinal mass. Small hiatal hernia. Heart/Vascular: Limited evaluation in the absence of intravenous contrast. Three vessel arch anatomy. Elongation of the aortic isthmus and proximal descending thoracic aorta. Scattered atherosclerotic vascular calcifications but no evidence of acute intramural hematoma or aneurysm. Calcifications present along the aortic valve cusps. The heart is within normal limits for size. No pericardial effusion. Calcifications present along the course of the coronary arteries. There is a stent in the proximal right coronary artery. Lungs/Pleura: Overall, there has been significant interval progression of a numerous bilateral pulmonary nodules which appear largely peribronchovascular in configuration with areas of micro and macro tree-in-bud nodularity. The regions of greatest increase are in the lower lobes bilaterally. The previously noted dominant lesion in the posterior aspect of the right upper lobe has not changed in size and appears more scar-like on today's examination. On today's evaluation, the largest conglomerations of macro nodularity are present in the left lower lobe. If measured in aggregate, the 2 largest examples measure 3.5 x 1.9 cm and 2.8 x 1.3 cm respectively. Faint band of radiation fibrosis again noted in the anterior left upper lobe. No evidence of pulmonary edema, pleural effusion or pneumothorax. Bones/Soft Tissues: No acute fracture or aggressive appearing lytic or blastic osseous lesion. Incompletely imaged surgical changes  in the left humerus. Stable benign-appearing sclerotic focus in the medial head of the left clavicle likely a small enchondroma. Surgical changes of prior left mastectomy. Upper Abdomen: Visualized upper abdominal organs are unremarkable. IMPRESSION: 1. Overall, significant interval progression of diffuse bilateral peribronchovascular micro and macro nodularity. The regions of greatest progression are in the lower lobes bilaterally. This imaging appearance is most suggestive of a progressive indolent atypical infection such as MAI. Multifocal endobronchial spread of tumor is a less likely consideration. Recommend non emergent pulmonology consultation. 2. Atherosclerosis including coronary artery calcifications. 3. Aortic valvular calcifications. If there is clinical concern for underlying aortic valve pathology suggest stenosis or regurgitation consider further evaluation with echocardiography. 4. Additional ancillary findings as above without significant interval change. Electronically Signed   By: Jacqulynn Cadet M.D.   On: 07/13/2015 19:21    ASSESSMENT AND PLAN  1. Unstable angina (HCC) - Cath showed normal LAD and Lcx;  severe RCA in-stent restenosis treated successfully with balloon angioplasty guided by IVUS; mild AS.  - Pending echo to evaluate LV function and to assess AS. Post cath creatinine to 1.02 - Continue asa, statin, plavix and bb. Not on ACE due to CKD.   2. Essential hypertension - Now improved. Continue BB and Norvasc.   3. CKD (chronic kidney disease), stage III  - post cath creatinine of 1.02  4. Pulmonary nodule - was seen on CT 11/2014.  - CT yesterday showed significant interval progression of diffuse bilateral peribronchovascular micro and macro nodularity. The regions of greatest progression are in the lower lobes bilaterally. This imaging appearance is most suggestive of a progressive indolent atypical infection such as MAI. Multifocal endobronchial spread of tumor is a  less likely consideration.  - Requested pulmonary consult.   5. DM - HgbA1c of 7.0. New diagnosis. Currently on SSI. Will place on metformin on discharge. F/u with PCP.   6. Valvular heart disease - mild AS/AI/MR by echo 07/2014. Pending echo.  7. HL - 07/14/2015: Cholesterol 136; HDL 47; LDL Cholesterol 63; Triglycerides 132; VLDL 26  - At  Goal. Continue current dose of statin  8. Anemia with high MCV and MCH - Hgb of 11.5-->11.2-->10.8. Follow closely. F/u with PCP for further eval.       Signed, Ha Shannahan PA-C Pager [phone]

## 2015-07-15 NOTE — Telephone Encounter (Signed)
Patient was Admitted to hospital from 07/13/15 -07/15/15. Guy Begin NP is requesting the patient be worked in for a hospital follow up for a possible Bronch. BQ and RB have no availability until February or March. I spoke with patient's daughter [name] and she would like for patient to be seen sooner. MW's first available is 08/14/2015.  MW please advise if okay to work patient in sooner on your schedule, thanks.

## 2015-07-15 NOTE — Progress Notes (Signed)
CARDIAC REHAB PHASE I   PRE:  Rate/Rhythm: 87 SR  BP:  Supine: 148/62  Sitting:   Standing:    SaO2:   MODE:  Ambulation: 900 ft   POST:  Rate/Rhythm: 103 ST  BP:  Supine:   Sitting: 173/82  Standing:    SaO2:  0925-1055 Pt tolerated ambulation well without c/o of cp or SOB. BP after walk 173/82. Pt to recliner after walk with call light in reach. Completed discharge education with pt and daughter. They voice understanding. Will send referral to Outpt. CRP in [place].  Rodney Langton RN 07/15/2015 10:58 AM
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
## 2015-07-16 ENCOUNTER — Telehealth: Payer: Self-pay | Admitting: Family Medicine

## 2015-07-16 NOTE — Telephone Encounter (Signed)
lmtcb x1 for pt. 

## 2015-07-16 NOTE — Telephone Encounter (Signed)
Transition Care Management Follow-up Telephone Call   Date discharged? 07/15/15   How have you been since you were released from the hospital? Per patient's daughter, she feels much better.     Do you understand why you were in the hospital? yes   Do you understand the discharge instructions? yes   Where were you discharged to? Home   Items Reviewed:  Medications reviewed: yes  Allergies reviewed: yes  Dietary changes reviewed: yes  Referrals reviewed: no   Functional Questionnaire:   Activities of Daily Living (ADLs):   She states they are independent in the following: none States they require assistance with the following: none   Any transportation issues/concerns?: no   Any patient concerns? no   Confirmed importance and date/time of follow-up visits scheduled yes, pt unable to schedule until 12/28 due to going out of town.    Provider Appointment booked with Dr Raoul Pitch 07/22/15  Confirmed with patient if condition begins to worsen call PCP or go to the ER.  Patient was given the office number and encouraged to call back with question or concerns.  : yes

## 2015-07-17 NOTE — Telephone Encounter (Signed)
LMTCB x2  

## 2015-07-21 NOTE — Telephone Encounter (Signed)
Attempted to contact pt. Line was busy. Will try back. 

## 2015-07-22 ENCOUNTER — Encounter: Payer: Self-pay | Admitting: Family Medicine

## 2015-07-22 ENCOUNTER — Ambulatory Visit (INDEPENDENT_AMBULATORY_CARE_PROVIDER_SITE_OTHER): Payer: Medicare Other | Admitting: Family Medicine

## 2015-07-22 VITALS — BP 118/73 | HR 72 | Temp 98.0°F | Resp 20 | Wt 131.8 lb

## 2015-07-22 DIAGNOSIS — I6523 Occlusion and stenosis of bilateral carotid arteries: Secondary | ICD-10-CM | POA: Diagnosis not present

## 2015-07-22 DIAGNOSIS — R7309 Other abnormal glucose: Secondary | ICD-10-CM | POA: Insufficient documentation

## 2015-07-22 DIAGNOSIS — I10 Essential (primary) hypertension: Secondary | ICD-10-CM

## 2015-07-22 DIAGNOSIS — D539 Nutritional anemia, unspecified: Secondary | ICD-10-CM | POA: Diagnosis not present

## 2015-07-22 DIAGNOSIS — R911 Solitary pulmonary nodule: Secondary | ICD-10-CM

## 2015-07-22 DIAGNOSIS — Z9289 Personal history of other medical treatment: Secondary | ICD-10-CM

## 2015-07-22 LAB — FOLATE

## 2015-07-22 LAB — GLUCOSE, POCT (MANUAL RESULT ENTRY): POC Glucose: 176 mg/dl — AB (ref 70–99)

## 2015-07-22 LAB — CBC WITH DIFFERENTIAL/PLATELET
Basophils Absolute: 0.1 10*3/uL (ref 0.0–0.1)
Basophils Relative: 0.9 % (ref 0.0–3.0)
EOS PCT: 5.9 % — AB (ref 0.0–5.0)
Eosinophils Absolute: 0.5 10*3/uL (ref 0.0–0.7)
HCT: 36 % (ref 36.0–46.0)
Hemoglobin: 12 g/dL (ref 12.0–15.0)
LYMPHS ABS: 1.4 10*3/uL (ref 0.7–4.0)
Lymphocytes Relative: 15.4 % (ref 12.0–46.0)
MCHC: 33.3 g/dL (ref 30.0–36.0)
MCV: 104.5 fl — AB (ref 78.0–100.0)
MONO ABS: 0.9 10*3/uL (ref 0.1–1.0)
MONOS PCT: 9.4 % (ref 3.0–12.0)
NEUTROS ABS: 6.2 10*3/uL (ref 1.4–7.7)
Neutrophils Relative %: 68.4 % (ref 43.0–77.0)
PLATELETS: 269 10*3/uL (ref 150.0–400.0)
RBC: 3.45 Mil/uL — AB (ref 3.87–5.11)
RDW: 15.3 % (ref 11.5–15.5)
WBC: 9.1 10*3/uL (ref 4.0–10.5)

## 2015-07-22 LAB — VITAMIN B12: Vitamin B-12: 544 pg/mL (ref 211–911)

## 2015-07-22 MED ORDER — METFORMIN HCL 500 MG PO TABS: 500.0000 mg | ORAL_TABLET | Freq: Every day | ORAL | Status: AC

## 2015-07-22 NOTE — Telephone Encounter (Signed)
Magda Paganini please advise if able to double book on 1/5 or 1/6 for a HFU so that when we call patient we have options for appt dates/times to offer.

## 2015-07-22 NOTE — Telephone Encounter (Signed)
Ov with MW for tomorrow at 1:30  Pt's daughter advised to bring all meds

## 2015-07-22 NOTE — Patient Instructions (Signed)
I will contact your cardiologist office and talk to them about your Imdur. They will contact you or will contact you on if you need to start it, they/we will call it in if you do.  If your dizziness does not improve or worsens you should be seen immediately. This could be connected to your blood pressure or sugar.  Keep track of BP at least 3 times a week and write them down. If you see BP lower than 110/60 or higher then 150/90, then we needs to see you. If you become more dizzy, take your BP and write it down.  I will call you with your lab results once available. We may need to add folate.  We have added metformin low dose to your regimen. This is for elevated glucose. You should continue to watch your diet, eating fresh veggies/fruit/and lower carb.   Diabetes Mellitus and Food It is important for you to manage your blood sugar (glucose) level. Your blood glucose level can be greatly affected by what you eat. Eating healthier foods in the appropriate amounts throughout the day at about the same time each day will help you control your blood glucose level. It can also help slow or prevent worsening of your diabetes mellitus. Healthy eating may even help you improve the level of your blood pressure and reach or maintain a healthy weight.  General recommendations for healthful eating and cooking habits include:  Eating meals and snacks regularly. Avoid going long periods of time without eating to lose weight.  Eating a diet that consists mainly of plant-based foods, such as fruits, vegetables, nuts, legumes, and whole grains.  Using low-heat cooking methods, such as baking, instead of high-heat cooking methods, such as deep frying. Work with your dietitian to make sure you understand how to use the Nutrition Facts information on food labels. HOW CAN FOOD AFFECT ME? Carbohydrates Carbohydrates affect your blood glucose level more than any other type of food. Your dietitian will help you determine  how many carbohydrates to eat at each meal and teach you how to count carbohydrates. Counting carbohydrates is important to keep your blood glucose at a healthy level, especially if you are using insulin or taking certain medicines for diabetes mellitus. Alcohol Alcohol can cause sudden decreases in blood glucose (hypoglycemia), especially if you use insulin or take certain medicines for diabetes mellitus. Hypoglycemia can be a life-threatening condition. Symptoms of hypoglycemia (sleepiness, dizziness, and disorientation) are similar to symptoms of having too much alcohol.  If your health care provider has given you approval to drink alcohol, do so in moderation and use the following guidelines:  Women should not have more than one drink per day, and men should not have more than two drinks per day. One drink is equal to:  12 oz of beer.  5 oz of wine.  1 oz of hard liquor.  Do not drink on an empty stomach.  Keep yourself hydrated. Have water, diet soda, or unsweetened iced tea.  Regular soda, juice, and other mixers might contain a lot of carbohydrates and should be counted. WHAT FOODS ARE NOT RECOMMENDED? As you make food choices, it is important to remember that all foods are not the same. Some foods have fewer nutrients per serving than other foods, even though they might have the same number of calories or carbohydrates. It is difficult to get your body what it needs when you eat foods with fewer nutrients. Examples of foods that you should avoid that are  high in calories and carbohydrates but low in nutrients include:  Trans fats (most processed foods list trans fats on the Nutrition Facts label).  Regular soda.  Juice.  Candy.  Sweets, such as cake, pie, doughnuts, and cookies.  Fried foods. WHAT FOODS CAN I EAT? Eat nutrient-rich foods, which will nourish your body and keep you healthy. The food you should eat also will depend on several factors, including:  The calories  you need.  The medicines you take.  Your weight.  Your blood glucose level.  Your blood pressure level.  Your cholesterol level. You should eat a variety of foods, including:  Protein.  Lean cuts of meat.  Proteins low in saturated fats, such as fish, egg whites, and beans. Avoid processed meats.  Fruits and vegetables.  Fruits and vegetables that may help control blood glucose levels, such as apples, mangoes, and yams.  Dairy products.  Choose fat-free or low-fat dairy products, such as milk, yogurt, and cheese.  Grains, bread, pasta, and rice.  Choose whole grain products, such as multigrain bread, whole oats, and brown rice. These foods may help control blood pressure.  Fats.  Foods containing healthful fats, such as nuts, avocado, olive oil, canola oil, and fish. DOES EVERYONE WITH DIABETES MELLITUS HAVE THE SAME MEAL PLAN? Because every person with diabetes mellitus is different, there is not one meal plan that works for everyone. It is very important that you meet with a dietitian who will help you create a meal plan that is just right for you.   This information is not intended to replace advice given to you by your health care provider. Make sure you discuss any questions you have with your health care provider.   Document Released: 04/07/2005 Document Revised: 08/01/2014 Document Reviewed: 06/07/2013 Elsevier Interactive Patient Education 2016 [employer]   Transient Ischemic Attack A transient ischemic attack (TIA) is a "warning stroke" that causes stroke-like symptoms. Unlike a stroke, a TIA does not cause permanent damage to the brain. The symptoms of a TIA can happen very fast and do not last long. It is important to know the symptoms of a TIA and what to do. This can help prevent a major stroke or death. CAUSES  A TIA is caused by a temporary blockage in an artery in the brain or neck (carotid artery). The blockage does not allow the brain to get the  blood supply it needs and can cause different symptoms. The blockage can be caused by either:  A blood clot.  Fatty buildup (plaque) in a neck or brain artery. RISK FACTORS  High blood pressure (hypertension).  High cholesterol.  Diabetes mellitus.  Heart disease.  The buildup of plaque in the blood vessels (peripheral artery disease or atherosclerosis).  The buildup of plaque in the blood vessels that provide blood and oxygen to the brain (carotid artery stenosis).  An abnormal heart rhythm (atrial fibrillation).  Obesity.  Using any tobacco products, including cigarettes, chewing tobacco, or electronic cigarettes.  Taking oral contraceptives, especially in combination with using tobacco.  Physical inactivity.  A diet high in fats, salt (sodium), and calories.  Excessive alcohol use.  Use of illegal drugs (especially cocaine and methamphetamine).  Being female.  Being African American.  Being over the age of 57 years.  Family history of stroke.  Previous history of blood clots, stroke, TIA, or heart attack.  Sickle cell disease. SIGNS AND SYMPTOMS  TIA symptoms are the same as a stroke but are temporary. These  symptoms usually develop suddenly, or may be newly present upon waking from sleep:  Sudden weakness or numbness of the face, arm, or leg, especially on one side of the body.  Sudden trouble walking or difficulty moving arms or legs.  Sudden confusion.  Sudden personality changes.  Trouble speaking (aphasia) or understanding.  Difficulty swallowing.  Sudden trouble seeing in one or both eyes.  Double vision.  Dizziness.  Loss of balance or coordination.  Sudden severe headache with no known cause.  Trouble reading or writing.  Loss of bowel or bladder control.  Loss of consciousness. DIAGNOSIS  Your health care provider may be able to determine the presence or absence of a TIA based on your symptoms, history, and physical exam. CT scan  of the brain is usually performed to help identify a TIA. Other tests may include:  Electrocardiography (ECG).  Continuous heart monitoring.  Echocardiography.  Carotid ultrasonography.  MRI.  A scan of the brain circulation.  Blood tests. TREATMENT  Since the symptoms of TIA are the same as a stroke, it is important to seek treatment as soon as possible. You may need a medicine to dissolve a blood clot (thrombolytic) if that is the cause of the TIA. This medicine cannot be given if too much time has passed. Treatment may also include:   Rest, oxygen, fluids through an IV tube, and medicines to thin the blood (anticoagulants).  Measures will be taken to prevent short-term and long-term complications, including infection from breathing foreign material into the lungs (aspiration pneumonia), blood clots in the legs, and falls.  Procedures to either remove plaque in the carotid arteries or dilate carotid arteries that have narrowed due to plaque. Those procedures are:  Carotid endarterectomy.  Carotid angioplasty and stenting.  Medicines and diet may be used to address diabetes, high blood pressure, and other underlying risk factors. HOME CARE INSTRUCTIONS   Take medicines only as directed by your health care provider. Follow the directions carefully. Medicines may be used to control risk factors for a stroke. Be sure you understand all your medicine instructions.  You may be told to take aspirin or the anticoagulant warfarin. Warfarin needs to be taken exactly as instructed.  Taking too much or too little warfarin is dangerous. Too much warfarin increases the risk of bleeding. Too little warfarin continues to allow the risk for blood clots. While taking warfarin, you will need to have regular blood tests to measure your blood clotting time. A PT blood test measures how long it takes for blood to clot. Your PT is used to calculate another value called an INR. Your PT and INR help your  health care provider to adjust your dose of warfarin. The dose can change for many reasons. It is critically important that you take warfarin exactly as prescribed.  Many foods, especially foods high in vitamin K can interfere with warfarin and affect the PT and INR. Foods high in vitamin K include spinach, kale, broccoli, cabbage, collard and turnip greens, Brussels sprouts, peas, cauliflower, seaweed, and parsley, as well as beef and pork liver, green tea, and soybean oil. You should eat a consistent amount of foods high in vitamin K. Avoid major changes in your diet, or notify your health care provider before changing your diet. Arrange a visit with a dietitian to answer your questions.  Many medicines can interfere with warfarin and affect the PT and INR. You must tell your health care provider about any and all medicines you take; this  includes all vitamins and supplements. Be especially cautious with aspirin and anti-inflammatory medicines. Do not take or discontinue any prescribed or over-the-counter medicine except on the advice of your health care provider or pharmacist.  Warfarin can have side effects, such as excessive bruising or bleeding. You will need to hold pressure over cuts for longer than usual. Your health care provider or pharmacist will discuss other potential side effects.  Avoid sports or activities that may cause injury or bleeding.  Be careful when shaving, flossing your teeth, or handling sharp objects.  Alcohol can change the body's ability to handle warfarin. It is best to avoid alcoholic drinks or consume only very small amounts while taking warfarin. Notify your health care provider if you change your alcohol intake.  Notify your dentist or other health care providers before procedures.  Eat a diet that includes 5 or more servings of fruits and vegetables each day. This may reduce the risk of stroke. Certain diets may be prescribed to address high blood pressure, high  cholesterol, diabetes, or obesity.  A diet low in sodium, saturated fat, trans fat, and cholesterol is recommended to manage high blood pressure.  A diet low in saturated fat, trans fat, and cholesterol, and high in fiber may control cholesterol levels.  A controlled-carbohydrate, controlled-sugar diet is recommended to manage diabetes.  A reduced-calorie diet that is low in sodium, saturated fat, trans fat, and cholesterol is recommended to manage obesity.  Maintain a healthy weight.  Stay physically active. It is recommended that you get at least 30 minutes of activity on most or all days.  Do not use any tobacco products, including cigarettes, chewing tobacco, or electronic cigarettes. If you need help quitting, ask your health care provider.  Limit alcohol intake to no more than 1 drink per day for nonpregnant women and 2 drinks per day for men. One drink equals 12 ounces of beer, 5 ounces of wine, or 1 ounces of hard liquor.  Do not abuse drugs.  A safe home environment is important to reduce the risk of falls. Your health care provider may arrange for specialists to evaluate your home. Having grab bars in the bedroom and bathroom is often important. Your health care provider may arrange for equipment to be used at home, such as raised toilets and a seat for the shower.  Follow all instructions for follow-up with your health care provider. This is very important. This includes any referrals and lab tests. Proper follow-up can prevent a stroke or another TIA from occurring. PREVENTION  The risk of a TIA can be decreased by appropriately treating high blood pressure, high cholesterol, diabetes, heart disease, and obesity, and by quitting smoking, limiting alcohol, and staying physically active. SEEK MEDICAL CARE IF:  You have personality changes.  You have difficulty swallowing.  You are seeing double.  You have dizziness.  You have a fever. SEEK IMMEDIATE MEDICAL CARE IF:    Any of the following symptoms may represent a serious problem that is an emergency. Do not wait to see if the symptoms will go away. Get medical help right away. Call your local emergency services (911 in [place] Do not drive yourself to the hospital.  You have sudden weakness or numbness of the face, arm, or leg, especially on one side of the body.  You have sudden trouble walking or difficulty moving arms or legs.  You have sudden confusion.  You have trouble speaking (aphasia) or understanding.  You have sudden trouble seeing in  one or both eyes.  You have a loss of balance or coordination.  You have a sudden, severe headache with no known cause.  You have new chest pain or an irregular heartbeat.  You have a partial or total loss of consciousness. MAKE SURE YOU:   Understand these instructions.  Will watch your condition.  Will get help right away if you are not doing well or get worse.   This information is not intended to replace advice given to you by your health care provider. Make sure you discuss any questions you have with your health care provider.   Document Released: 04/20/2005 Document Revised: 08/01/2014 Document Reviewed: 10/16/2013 Elsevier Interactive Patient Education [employer].
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
## 2015-07-22 NOTE — Telephone Encounter (Signed)
Spoke with pt's daughter, advised that we were waiting on a finalized time on when to schedule pt before we actually schedule the appt.  Pt's daughter expressed understanding.   Sara Lynch please advise on when pt can be double booked.  Thanks!

## 2015-07-22 NOTE — Telephone Encounter (Signed)
Patients daughter returning call regarding hfu visit. Please call her back at [phone] teresa pierce (daughter)

## 2015-07-23 ENCOUNTER — Other Ambulatory Visit (INDEPENDENT_AMBULATORY_CARE_PROVIDER_SITE_OTHER): Payer: Medicare Other

## 2015-07-23 ENCOUNTER — Ambulatory Visit (INDEPENDENT_AMBULATORY_CARE_PROVIDER_SITE_OTHER): Payer: Medicare Other | Admitting: Internal Medicine

## 2015-07-23 ENCOUNTER — Telehealth: Payer: Self-pay | Admitting: Family Medicine

## 2015-07-23 ENCOUNTER — Encounter: Payer: Self-pay | Admitting: Internal Medicine

## 2015-07-23 VITALS — BP 110/60 | HR 66 | Ht 67.0 in | Wt 132.0 lb

## 2015-07-23 DIAGNOSIS — R0600 Dyspnea, unspecified: Secondary | ICD-10-CM

## 2015-07-23 DIAGNOSIS — D539 Nutritional anemia, unspecified: Secondary | ICD-10-CM

## 2015-07-23 DIAGNOSIS — I6523 Occlusion and stenosis of bilateral carotid arteries: Secondary | ICD-10-CM | POA: Diagnosis not present

## 2015-07-23 DIAGNOSIS — R918 Other nonspecific abnormal finding of lung field: Secondary | ICD-10-CM | POA: Diagnosis not present

## 2015-07-23 DIAGNOSIS — Z9289 Personal history of other medical treatment: Secondary | ICD-10-CM | POA: Insufficient documentation

## 2015-07-23 LAB — CBC WITH DIFFERENTIAL/PLATELET
BASOS PCT: 0.4 % (ref 0.0–3.0)
Basophils Absolute: 0 10*3/uL (ref 0.0–0.1)
EOS PCT: 5.6 % — AB (ref 0.0–5.0)
Eosinophils Absolute: 0.6 10*3/uL (ref 0.0–0.7)
HEMATOCRIT: 36.6 % (ref 36.0–46.0)
HEMOGLOBIN: 11.9 g/dL — AB (ref 12.0–15.0)
LYMPHS PCT: 15.7 % (ref 12.0–46.0)
Lymphs Abs: 1.6 10*3/uL (ref 0.7–4.0)
MCHC: 32.5 g/dL (ref 30.0–36.0)
MCV: 105.2 fl — AB (ref 78.0–100.0)
Monocytes Absolute: 0.9 10*3/uL (ref 0.1–1.0)
Monocytes Relative: 8.5 % (ref 3.0–12.0)
Neutro Abs: 7.1 10*3/uL (ref 1.4–7.7)
Neutrophils Relative %: 69.8 % (ref 43.0–77.0)
Platelets: 295 10*3/uL (ref 150.0–400.0)
RBC: 3.48 Mil/uL — ABNORMAL LOW (ref 3.87–5.11)
RDW: 15.9 % — AB (ref 11.5–15.5)
WBC: 10.2 10*3/uL (ref 4.0–10.5)

## 2015-07-23 LAB — SEDIMENTATION RATE: Sed Rate: 57 mm/hr — ABNORMAL HIGH (ref 0–22)

## 2015-07-23 LAB — BRAIN NATRIURETIC PEPTIDE: Pro B Natriuretic peptide (BNP): 103 pg/mL — ABNORMAL HIGH (ref 0.0–100.0)

## 2015-07-23 NOTE — Telephone Encounter (Signed)
Please call pt's daughter (or pt): - Her blood work from yesterday showed a normal folate and B-12 level. - She still is showing signs of anemia, and it is likely from her methotrexate but we need to perform some additional labs to confirm. - I placed the orders for these for her to have collected at her earliest convenience. We will call her once results are available.

## 2015-07-23 NOTE — Telephone Encounter (Signed)
Spoke with patient daughter Rosanne Ashing reviewed results . appt scheduled for lab work.

## 2015-07-23 NOTE — Patient Instructions (Addendum)
Please remember to go to the x-ray department downstairs for your tests - we will call you with the results when they are available.   Please schedule a follow up office visit in 4 weeks, sooner if needed with cxr on return

## 2015-07-23 NOTE — Telephone Encounter (Signed)
Please communicate with Dr. Johnsie Cancel [facility]) office that Ms. Sara Lynch was seen for an OV yesterday for a f/u after cardiology admit. She had a left heart cath and her BP meds were altered. Per the DC summary she was to start Imdur in addition to her increase in amlodipine and coreg. Pt has not started this medication/have a prescription, nor does it appear on her DC summary med list. Pt is complaining of mild dizziness in the office and her BP was 118/73 and 110/60 in her pulmonology OV today.  I did not start imdur since it was not on medlist from DC, and pt BP mildly on the low end for her with dizziness.  - I told Ms. Sara Lynch we would let her cardio office know and they can contact her with their plan.
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
## 2015-07-28 ENCOUNTER — Encounter: Payer: Self-pay | Admitting: Internal Medicine

## 2015-07-28 NOTE — Assessment & Plan Note (Addendum)
See CT chest 07/13/15 with corresponding cxr nad - note pt on mtx since 2015  For polymyositis  Although there are clearly abnormalities on CT scan, they should probably be considered "microscopic" since not obvious on plain cxr .     In the setting of obvious "macroscopic" health issues, not the least being age 82 and fresh stent in RCA (requiring plt inhibitors) I am very reluctatnt to embark on an invasive w/u at this point but will arrange consevative  follow up   Most likely this is another in a long list of cases we follow here for MAI where the treatment in this population is worse than the dz/ which if present is asymptomatic at this point.   Discussed in detail all the  indications, usual  risks and alternatives  relative to the benefits with patient and fm who agree  to proceed with conservative f/u as outlined    Total time devoted to counseling  = 25/40 m review case with pt/ discussion of options/alternatives/ giving and going over instructions (see avs)

## 2015-07-28 NOTE — Assessment & Plan Note (Signed)
07/23/2015   Walked RA  2 laps @ 185 ft each stopped due to legs weak > sob, no desat, moderate  pace   Much improved since admit s/p new stent > no further w/u needed from pulmonary perspective

## 2015-07-30 ENCOUNTER — Other Ambulatory Visit (INDEPENDENT_AMBULATORY_CARE_PROVIDER_SITE_OTHER): Payer: Medicare Other

## 2015-07-30 DIAGNOSIS — D539 Nutritional anemia, unspecified: Secondary | ICD-10-CM

## 2015-07-30 DIAGNOSIS — R7989 Other specified abnormal findings of blood chemistry: Secondary | ICD-10-CM

## 2015-07-30 DIAGNOSIS — R748 Abnormal levels of other serum enzymes: Secondary | ICD-10-CM

## 2015-07-30 LAB — COMPREHENSIVE METABOLIC PANEL
ALT: 12 U/L (ref 0–35)
AST: 14 U/L (ref 0–37)
Albumin: 3.7 g/dL (ref 3.5–5.2)
Alkaline Phosphatase: 70 U/L (ref 39–117)
BILIRUBIN TOTAL: 0.5 mg/dL (ref 0.2–1.2)
BUN: 28 mg/dL — ABNORMAL HIGH (ref 6–23)
CALCIUM: 9.3 mg/dL (ref 8.4–10.5)
CO2: 28 meq/L (ref 19–32)
CREATININE: 1.18 mg/dL (ref 0.40–1.20)
Chloride: 105 mEq/L (ref 96–112)
GFR: 46.35 mL/min — ABNORMAL LOW (ref >60.00)
Glucose, Bld: 183 mg/dL — ABNORMAL HIGH (ref 70–99)
Potassium: 4.3 mEq/L (ref 3.5–5.1)
Sodium: 142 mEq/L (ref 135–145)
TOTAL PROTEIN: 5.9 g/dL — AB (ref 6.0–8.3)

## 2015-07-30 LAB — CBC WITH DIFFERENTIAL/PLATELET
Basophils Absolute: 0.1 10*3/uL (ref 0.0–0.1)
Basophils Relative: 1 % (ref 0–1)
EOS ABS: 0.7 10*3/uL (ref 0.0–0.7)
EOS PCT: 11 % — AB (ref 0–5)
HCT: 35 % — ABNORMAL LOW (ref 36.0–46.0)
Hemoglobin: 11.5 g/dL — ABNORMAL LOW (ref 12.0–15.0)
LYMPHS ABS: 1.3 10*3/uL (ref 0.7–4.0)
Lymphocytes Relative: 20 % (ref 12–46)
MCH: 34 pg (ref 26.0–34.0)
MCHC: 32.9 g/dL (ref 30.0–36.0)
MCV: 103.6 fL — AB (ref 78.0–100.0)
MONOS PCT: 9 % (ref 3–12)
MPV: 9 fL (ref 8.6–12.4)
Monocytes Absolute: 0.6 10*3/uL (ref 0.1–1.0)
Neutro Abs: 3.8 10*3/uL (ref 1.7–7.7)
Neutrophils Relative %: 59 % (ref 43–77)
PLATELETS: 289 10*3/uL (ref 150–400)
RBC: 3.38 MIL/uL — ABNORMAL LOW (ref 3.87–5.11)
RDW: 15.1 % (ref 11.5–15.5)
WBC: 6.4 10*3/uL (ref 4.0–10.5)

## 2015-07-31 LAB — RETICULOCYTES
ABS Retic: 62.9 10*3/uL (ref 19.0–186.0)
RBC.: 3.31 MIL/uL — ABNORMAL LOW (ref 3.87–5.11)
Retic Ct Pct: 1.9 % (ref 0.4–2.3)

## 2015-07-31 LAB — HAPTOGLOBIN: Haptoglobin: 252 mg/dL — ABNORMAL HIGH (ref 43–212)

## 2015-07-31 LAB — LACTATE DEHYDROGENASE: LDH: 167 U/L (ref 94–250)

## 2015-07-31 LAB — PATHOLOGIST SMEAR REVIEW

## 2015-08-03 ENCOUNTER — Ambulatory Visit: Payer: Medicare Other | Admitting: Cardiology

## 2015-08-04 ENCOUNTER — Telehealth: Payer: Self-pay | Admitting: Family Medicine

## 2015-08-04 NOTE — Telephone Encounter (Signed)
LMOM for pt's daughter to CB. 

## 2015-08-04 NOTE — Telephone Encounter (Signed)
Spoke with patient daughter reviewed results . She is requesting we mail copy of results to [facility] Attn: Dr Gavin Pound. Mailed copy as requested.

## 2015-08-04 NOTE — Telephone Encounter (Signed)
Please call pt daughter: - Mrs. Markowicz's lab work does suggest her anemia is likely from her methotrexate use. All other studies for that type of anemia have been normal. I am not certain the provider of her methotrexate, but they should be monitoring her blood counts with its use. I would encouraged her to make them aware if all recent work-up (print off labs-or we can mail them to her) so they can discuss in more detail.

## 2015-08-05 ENCOUNTER — Encounter: Payer: Medicare Other | Admitting: Nurse Practitioner

## 2015-08-17 ENCOUNTER — Encounter: Payer: Self-pay | Admitting: Physician Assistant

## 2015-08-17 ENCOUNTER — Ambulatory Visit: Payer: Medicare Other | Admitting: Physician Assistant

## 2015-08-17 ENCOUNTER — Ambulatory Visit (INDEPENDENT_AMBULATORY_CARE_PROVIDER_SITE_OTHER): Payer: Medicare Other | Admitting: Physician Assistant

## 2015-08-17 VITALS — BP 115/60 | HR 63 | Ht 67.0 in | Wt 132.0 lb

## 2015-08-17 DIAGNOSIS — I38 Endocarditis, valve unspecified: Secondary | ICD-10-CM

## 2015-08-17 DIAGNOSIS — Z955 Presence of coronary angioplasty implant and graft: Secondary | ICD-10-CM | POA: Diagnosis not present

## 2015-08-17 DIAGNOSIS — I2510 Atherosclerotic heart disease of native coronary artery without angina pectoris: Secondary | ICD-10-CM

## 2015-08-17 DIAGNOSIS — I10 Essential (primary) hypertension: Secondary | ICD-10-CM | POA: Diagnosis not present

## 2015-08-17 DIAGNOSIS — M3322 Polymyositis with myopathy: Secondary | ICD-10-CM | POA: Diagnosis not present

## 2015-08-17 DIAGNOSIS — D649 Anemia, unspecified: Secondary | ICD-10-CM | POA: Diagnosis not present

## 2015-08-17 DIAGNOSIS — M1009 Idiopathic gout, multiple sites: Secondary | ICD-10-CM | POA: Diagnosis not present

## 2015-08-17 DIAGNOSIS — I739 Peripheral vascular disease, unspecified: Secondary | ICD-10-CM

## 2015-08-17 DIAGNOSIS — Z79899 Other long term (current) drug therapy: Secondary | ICD-10-CM | POA: Diagnosis not present

## 2015-08-17 DIAGNOSIS — R5383 Other fatigue: Secondary | ICD-10-CM | POA: Diagnosis not present

## 2015-08-17 DIAGNOSIS — M2550 Pain in unspecified joint: Secondary | ICD-10-CM | POA: Diagnosis not present

## 2015-08-17 DIAGNOSIS — I779 Disorder of arteries and arterioles, unspecified: Secondary | ICD-10-CM

## 2015-08-17 NOTE — Assessment & Plan Note (Signed)
Blood pressure stable ? ?

## 2015-08-17 NOTE — Assessment & Plan Note (Signed)
She had 2-D echo in the hospital showing moderate  Aortic stenosiswith mild to moderate AI and grade 1 diastolic dysfunction EF [id number]. Will cancel echo scheduled for February.

## 2015-08-17 NOTE — Patient Instructions (Signed)
Medication Instructions:  Your physician recommends that you continue on your current medications as directed. Please refer to the Current Medication list given to you today.   Labwork: None   Testing/Procedures: Carotid doppler scheduled for February 2,2017  Follow-Up: Your physician recommends that you schedule a follow-up appointment in: 2 months with Dr Johnsie Cancel.         If you need a refill on your cardiac medications before your next appointment, please call your pharmacy.

## 2015-08-17 NOTE — Assessment & Plan Note (Signed)
Patient scheduled for carotid Dopplers in February
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
## 2015-08-17 NOTE — Assessment & Plan Note (Signed)
Patient recent balloon angioplasty for in-stent restenosis of the RCA 06/2015. She is doing well without any recurrent symptoms. Continue Plavix , aspirin, Coreg and Pravachol. Follow-up with Dr. Johnsie Cancel in 2 months

## 2015-08-20 ENCOUNTER — Ambulatory Visit (INDEPENDENT_AMBULATORY_CARE_PROVIDER_SITE_OTHER)
Admission: RE | Admit: 2015-08-20 | Discharge: 2015-08-20 | Disposition: A | Discharge status: Home / Self Care | Payer: Medicare Other | Source: Ambulatory Visit | Attending: Internal Medicine | Admitting: Internal Medicine

## 2015-08-20 ENCOUNTER — Encounter: Payer: Self-pay | Admitting: Internal Medicine

## 2015-08-20 ENCOUNTER — Ambulatory Visit (INDEPENDENT_AMBULATORY_CARE_PROVIDER_SITE_OTHER): Payer: Medicare Other | Admitting: Internal Medicine

## 2015-08-20 VITALS — BP 108/62 | HR 74 | Ht 67.0 in | Wt 132.2 lb

## 2015-08-20 DIAGNOSIS — R0600 Dyspnea, unspecified: Secondary | ICD-10-CM

## 2015-08-20 DIAGNOSIS — R918 Other nonspecific abnormal finding of lung field: Secondary | ICD-10-CM | POA: Diagnosis not present

## 2015-08-20 DIAGNOSIS — I2510 Atherosclerotic heart disease of native coronary artery without angina pectoris: Secondary | ICD-10-CM

## 2015-08-20 NOTE — Patient Instructions (Signed)
Please schedule a follow up visit in 12  months but call sooner if needed  
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
## 2015-08-20 NOTE — Assessment & Plan Note (Addendum)
See CT chest 07/13/15 with corresponding cxr nad - note pt on mtx since 2015  For polymyositis    I had an extended discussion with the patient reviewing all relevant studies completed to date and  lasting 15 to 20 minutes of a 25 minute visit on the following ongoing concerns:   No evidence of clinical or radiographic deterioration in pt with polymyositis/on mtx so immunocromised with possible MAI suggested by nodules best seen on CT.  In absence of fever or purulent sputum or radiographic change I would not rec FOB / bal which would be the least required before initiating 2-3 drug chronic rx  Discussed in detail all the  indications, usual  risks and alternatives  relative to the benefits with patient who agrees to proceed with conservative f/u as outlined    Each maintenance medication was reviewed in detail including most importantly the difference between maintenance and as needed and under what circumstances the prns are to be used.  Please see instructions for details which were reviewed in writing and the patient given a copy.

## 2015-08-20 NOTE — Assessment & Plan Note (Signed)
07/23/2015   Walked RA  2 laps @ 185 ft each stopped due to legs weak > sob, no desat, moderate  pace   She appears much more robust today

## 2015-08-21 ENCOUNTER — Ambulatory Visit: Payer: Medicare Other | Admitting: Internal Medicine

## 2015-08-27 ENCOUNTER — Other Ambulatory Visit (HOSPITAL_COMMUNITY): Payer: Medicare Other

## 2015-08-27 ENCOUNTER — Ambulatory Visit (HOSPITAL_COMMUNITY)
Admission: RE | Admit: 2015-08-27 | Discharge: 2015-08-27 | Disposition: A | Discharge status: Home / Self Care | Payer: Medicare Other | Source: Ambulatory Visit | Attending: Internal Medicine | Admitting: Internal Medicine

## 2015-08-27 DIAGNOSIS — R0989 Other specified symptoms and signs involving the circulatory and respiratory systems: Secondary | ICD-10-CM | POA: Insufficient documentation

## 2015-08-27 DIAGNOSIS — I6523 Occlusion and stenosis of bilateral carotid arteries: Secondary | ICD-10-CM | POA: Diagnosis not present

## 2015-08-31 ENCOUNTER — Telehealth: Payer: Self-pay | Admitting: Cardiovascular Disease

## 2015-08-31 MED ORDER — FUROSEMIDE 20 MG PO TABS: 20.0000 mg | ORAL_TABLET | Freq: Every day | ORAL | Status: AC | PRN

## 2015-08-31 NOTE — Telephone Encounter (Signed)
New message      Pt c/o swelling: STAT is pt has developed SOB within 24 hours  1. How long have you been experiencing swelling? Noticed it this am when she woke up 2. Where is the swelling located? ankles 3.  Are you currently taking a "fluid pill"?----pt has fluid pills to take prn.  What dosage of the furosemide 40mg  should she take? 4.  Are you currently SOB? no 5.  Have you traveled recently?  no

## 2015-08-31 NOTE — Telephone Encounter (Signed)
Ok to call in lasix 20 mg PRN edema

## 2015-08-31 NOTE — Telephone Encounter (Signed)
Patient's daughter wants to know if patient can take Lasix for swelling in her ankles. While patient was in the hospital 07/13/15, Lasix was discontinued due to patient not taking. Last prescription was for Lasix 20 mg by mouth as needed for swelling. Patient's last labs: BUN 28  Creatinine 1.18 a mouth ago. Informed daughter that Lasix is no longer on patient's medication list, but will see if Dr. Johnsie Cancel will restart. Encouraged patient for now to elevate ankles and reduce salt in diet.   Will forward to Dr. Johnsie Cancel for further advisement.

## 2015-08-31 NOTE — Telephone Encounter (Signed)
Left message with patient's daughter that prescription has been sent.

## 2015-09-09 DIAGNOSIS — Z79899 Other long term (current) drug therapy: Secondary | ICD-10-CM | POA: Diagnosis not present

## 2015-09-09 DIAGNOSIS — M3322 Polymyositis with myopathy: Secondary | ICD-10-CM | POA: Diagnosis not present

## 2015-09-09 DIAGNOSIS — M2550 Pain in unspecified joint: Secondary | ICD-10-CM | POA: Diagnosis not present

## 2015-09-09 DIAGNOSIS — M545 Low back pain: Secondary | ICD-10-CM | POA: Diagnosis not present

## 2015-09-09 DIAGNOSIS — M1009 Idiopathic gout, multiple sites: Secondary | ICD-10-CM | POA: Diagnosis not present

## 2015-09-15 DIAGNOSIS — H35342 Macular cyst, hole, or pseudohole, left eye: Secondary | ICD-10-CM | POA: Diagnosis not present

## 2015-09-25 DIAGNOSIS — N3001 Acute cystitis with hematuria: Secondary | ICD-10-CM | POA: Diagnosis not present

## 2015-09-28 ENCOUNTER — Ambulatory Visit: Payer: Medicare Other | Admitting: Physical Medicine & Rehabilitation

## 2015-10-01 ENCOUNTER — Other Ambulatory Visit: Payer: Self-pay | Admitting: Cardiovascular Disease

## 2015-10-05 DIAGNOSIS — H43811 Vitreous degeneration, right eye: Secondary | ICD-10-CM | POA: Diagnosis not present

## 2015-10-05 LAB — HM DIABETES EYE EXAM
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
## 2015-10-22 ENCOUNTER — Ambulatory Visit (INDEPENDENT_AMBULATORY_CARE_PROVIDER_SITE_OTHER): Payer: Medicare Other | Admitting: Cardiovascular Disease

## 2015-10-22 ENCOUNTER — Encounter: Payer: Self-pay | Admitting: Cardiovascular Disease

## 2015-10-22 VITALS — BP 120/58 | HR 72 | Ht 67.0 in | Wt 130.8 lb

## 2015-10-22 DIAGNOSIS — I2510 Atherosclerotic heart disease of native coronary artery without angina pectoris: Secondary | ICD-10-CM

## 2015-10-22 DIAGNOSIS — I2583 Coronary atherosclerosis due to lipid rich plaque: Principal | ICD-10-CM

## 2015-10-22 NOTE — Patient Instructions (Signed)

## 2015-11-14 ENCOUNTER — Other Ambulatory Visit: Payer: Self-pay | Admitting: Physician Assistant

## 2015-11-16 NOTE — Telephone Encounter (Signed)
REFILL 

## 2015-11-23 DIAGNOSIS — M3322 Polymyositis with myopathy: Secondary | ICD-10-CM | POA: Diagnosis not present

## 2015-11-23 DIAGNOSIS — M1009 Idiopathic gout, multiple sites: Secondary | ICD-10-CM | POA: Diagnosis not present

## 2015-11-23 DIAGNOSIS — Z79899 Other long term (current) drug therapy: Secondary | ICD-10-CM | POA: Diagnosis not present

## 2015-11-24 ENCOUNTER — Telehealth: Payer: Self-pay

## 2015-11-24 ENCOUNTER — Telehealth: Payer: Self-pay | Admitting: Family Medicine

## 2015-11-24 MED ORDER — CARVEDILOL 25 MG PO TABS: 25.0000 mg | ORAL_TABLET | Freq: Two times a day (BID) | ORAL | Status: AC

## 2015-11-24 MED ORDER — PRAVASTATIN SODIUM 40 MG PO TABS: 40.0000 mg | ORAL_TABLET | Freq: Every day | ORAL | Status: AC

## 2015-11-24 NOTE — Telephone Encounter (Signed)
Pt will be moving to [place] permanently on May 18th. Daughter would like to know if she can still have her Rx's filled (2-3 months worth) until she can find a new PCP there. Please advise.

## 2015-11-24 NOTE — Telephone Encounter (Signed)
Called patient, no answer and mail box is full. Patient left message at the office that she needed refills. Patient is moving to [place] and during the transition medications will run out. Patient needs refill on all medications before moving. Tried to call and confirm.

## 2015-12-03 NOTE — Telephone Encounter (Signed)
Tried to call patient daughter to find out what medications she was requesting from [place] but mailbox is full unable to leave message.

## 2015-12-03 NOTE — Telephone Encounter (Signed)
Patients daughter also called cardiology for Rx's. Looks like they have recently filled patient medications.

## 2015-12-16 DIAGNOSIS — H1089 Other conjunctivitis: Secondary | ICD-10-CM | POA: Diagnosis not present

## 2016-01-19 DIAGNOSIS — Z6820 Body mass index (BMI) 20.0-20.9, adult: Secondary | ICD-10-CM | POA: Diagnosis not present

## 2016-01-19 DIAGNOSIS — I25118 Atherosclerotic heart disease of native coronary artery with other forms of angina pectoris: Secondary | ICD-10-CM | POA: Diagnosis not present

## 2016-01-19 DIAGNOSIS — R7309 Other abnormal glucose: Secondary | ICD-10-CM | POA: Diagnosis not present

## 2016-01-19 DIAGNOSIS — Z1389 Encounter for screening for other disorder: Secondary | ICD-10-CM | POA: Diagnosis not present

## 2016-01-19 DIAGNOSIS — N390 Urinary tract infection, site not specified: Secondary | ICD-10-CM | POA: Diagnosis not present

## 2016-01-19 DIAGNOSIS — K219 Gastro-esophageal reflux disease without esophagitis: Secondary | ICD-10-CM | POA: Diagnosis not present

## 2016-01-19 DIAGNOSIS — E782 Mixed hyperlipidemia: Secondary | ICD-10-CM | POA: Diagnosis not present

## 2016-01-19 DIAGNOSIS — I10 Essential (primary) hypertension: Secondary | ICD-10-CM | POA: Diagnosis not present

## 2016-01-19 DIAGNOSIS — B9629 Other Escherichia coli [E. coli] as the cause of diseases classified elsewhere: Secondary | ICD-10-CM | POA: Diagnosis not present

## 2016-01-20 DIAGNOSIS — N390 Urinary tract infection, site not specified: Secondary | ICD-10-CM | POA: Diagnosis not present

## 2016-02-14 ENCOUNTER — Other Ambulatory Visit: Payer: Self-pay | Admitting: Physician Assistant

## 2016-03-06 ENCOUNTER — Other Ambulatory Visit: Payer: Self-pay | Admitting: Physician Assistant

## 2016-03-08 ENCOUNTER — Other Ambulatory Visit: Payer: Self-pay | Admitting: Cardiovascular Disease

## 2016-05-16 DIAGNOSIS — C44321 Squamous cell carcinoma of skin of nose: Secondary | ICD-10-CM | POA: Diagnosis not present

## 2016-05-16 DIAGNOSIS — D0439 Carcinoma in situ of skin of other parts of face: Secondary | ICD-10-CM | POA: Diagnosis not present

## 2016-05-16 DIAGNOSIS — C44729 Squamous cell carcinoma of skin of left lower limb, including hip: Secondary | ICD-10-CM | POA: Diagnosis not present

## 2016-05-16 DIAGNOSIS — D0470 Carcinoma in situ of skin of unspecified lower limb, including hip: Secondary | ICD-10-CM | POA: Diagnosis not present

## 2016-05-19 DIAGNOSIS — Z23 Encounter for immunization: Secondary | ICD-10-CM | POA: Diagnosis not present

## 2016-06-13 DIAGNOSIS — D518 Other vitamin B12 deficiency anemias: Secondary | ICD-10-CM | POA: Diagnosis not present

## 2016-06-13 DIAGNOSIS — I10 Essential (primary) hypertension: Secondary | ICD-10-CM | POA: Diagnosis not present

## 2016-06-13 DIAGNOSIS — E559 Vitamin D deficiency, unspecified: Secondary | ICD-10-CM | POA: Diagnosis not present

## 2016-06-13 DIAGNOSIS — E038 Other specified hypothyroidism: Secondary | ICD-10-CM | POA: Diagnosis not present

## 2016-06-13 DIAGNOSIS — I25118 Atherosclerotic heart disease of native coronary artery with other forms of angina pectoris: Secondary | ICD-10-CM | POA: Diagnosis not present

## 2016-06-13 DIAGNOSIS — M353 Polymyalgia rheumatica: Secondary | ICD-10-CM | POA: Diagnosis not present

## 2016-06-13 DIAGNOSIS — Z79899 Other long term (current) drug therapy: Secondary | ICD-10-CM | POA: Diagnosis not present

## 2016-06-13 DIAGNOSIS — R010 Benign and innocent cardiac murmurs: Secondary | ICD-10-CM | POA: Diagnosis not present

## 2016-06-13 DIAGNOSIS — Z0000 Encounter for general adult medical examination without abnormal findings: Secondary | ICD-10-CM | POA: Diagnosis not present

## 2016-06-13 DIAGNOSIS — Z6820 Body mass index (BMI) 20.0-20.9, adult: Secondary | ICD-10-CM | POA: Diagnosis not present

## 2016-06-13 DIAGNOSIS — E782 Mixed hyperlipidemia: Secondary | ICD-10-CM | POA: Diagnosis not present

## 2016-07-01 ENCOUNTER — Other Ambulatory Visit: Payer: Self-pay

## 2016-07-07 DIAGNOSIS — I25118 Atherosclerotic heart disease of native coronary artery with other forms of angina pectoris: Secondary | ICD-10-CM | POA: Diagnosis not present

## 2016-07-07 DIAGNOSIS — R7309 Other abnormal glucose: Secondary | ICD-10-CM | POA: Diagnosis not present

## 2016-07-07 DIAGNOSIS — M353 Polymyalgia rheumatica: Secondary | ICD-10-CM | POA: Diagnosis not present

## 2016-07-07 DIAGNOSIS — R42 Dizziness and giddiness: Secondary | ICD-10-CM | POA: Diagnosis not present

## 2016-07-07 DIAGNOSIS — R011 Cardiac murmur, unspecified: Secondary | ICD-10-CM | POA: Diagnosis not present

## 2016-07-07 DIAGNOSIS — Z6820 Body mass index (BMI) 20.0-20.9, adult: Secondary | ICD-10-CM | POA: Diagnosis not present

## 2016-07-07 DIAGNOSIS — I10 Essential (primary) hypertension: Secondary | ICD-10-CM | POA: Diagnosis not present

## 2016-07-28 DIAGNOSIS — M8589 Other specified disorders of bone density and structure, multiple sites: Secondary | ICD-10-CM | POA: Diagnosis not present

## 2016-07-28 DIAGNOSIS — M810 Age-related osteoporosis without current pathological fracture: Secondary | ICD-10-CM | POA: Diagnosis not present

## 2016-08-12 IMAGING — CT CT CHEST W/O CM
2 of 4 series · 15 of 36 positions shown, 18 images · non-contrast
Comparison: Prior CT scan of the chest 12/13/2014

CLINICAL DATA: 86-year-old female with intermittent chest pain
since this past [REDACTED]

EXAM:
CT CHEST WITHOUT CONTRAST
TECHNIQUE: Multidetector CT imaging of the chest was performed following the
standard protocol without IV contrast.

[Series 2: thorax 5.0 i31f 1 · axial · 0.60mm/px · z∈[+1030,+1270]mm · 12 of 58 slices shown, 15 images]
[im 5/58  mediastinal]
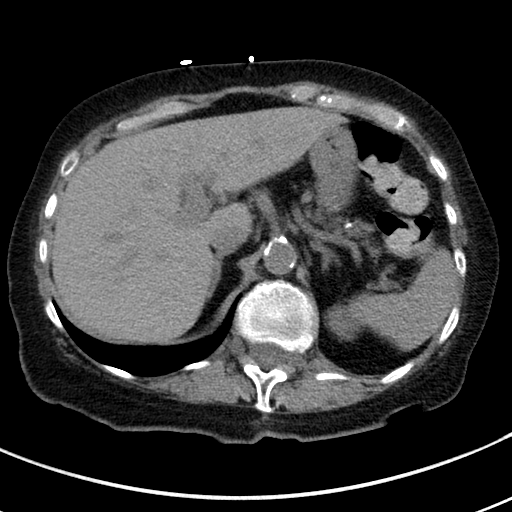
[im 5/58  lung]
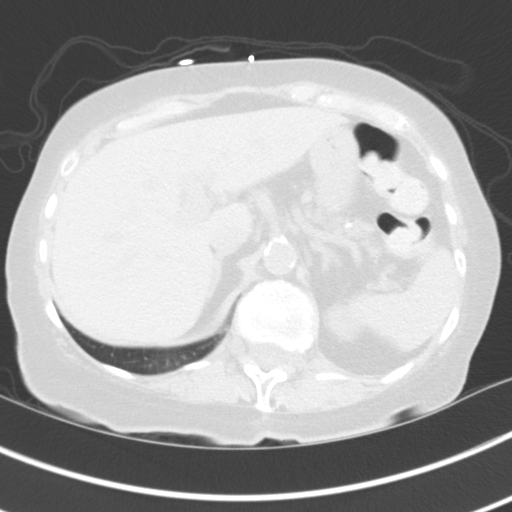
[im 9/58  lung]
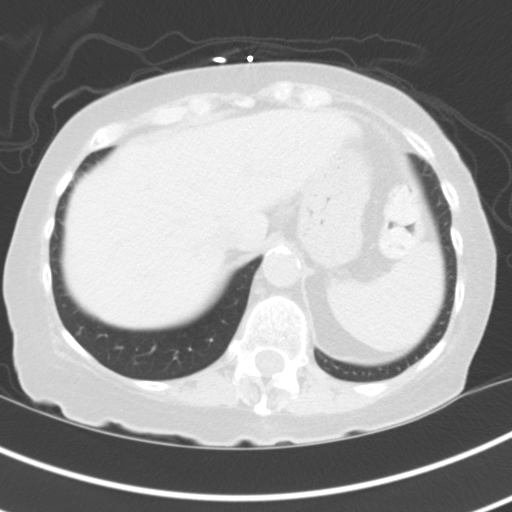
[im 14/58  lung]
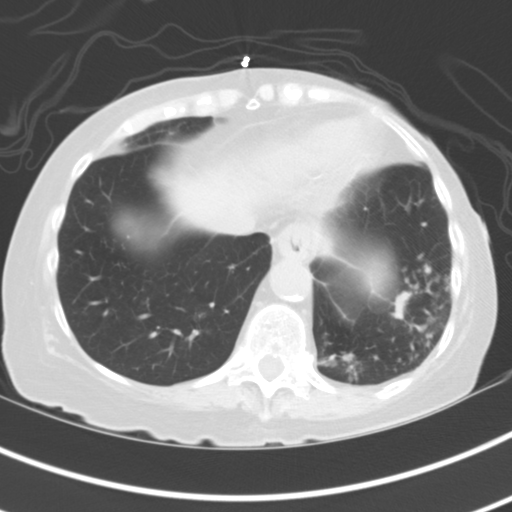
[im 18/58  lung]
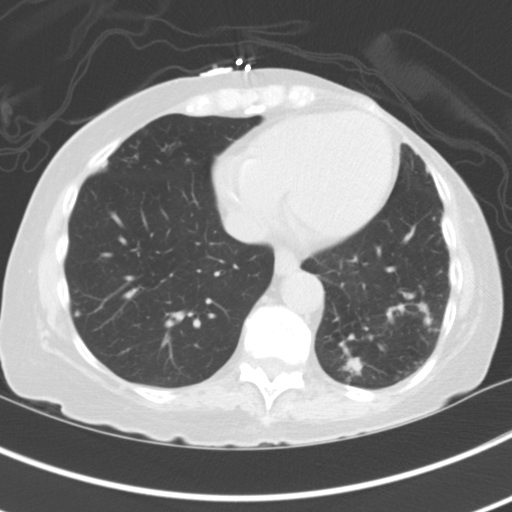
[im 22/58  mediastinal]
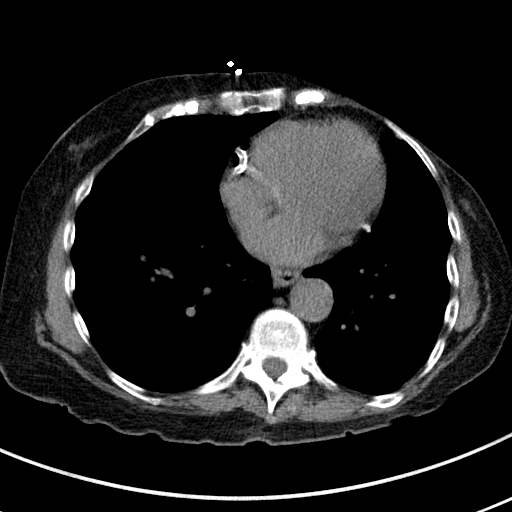
[im 22/58  lung]
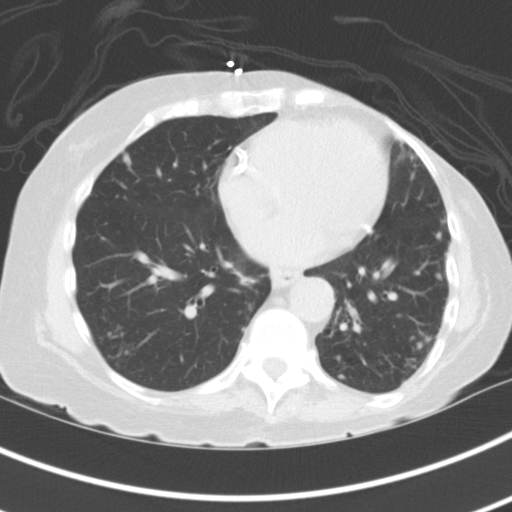
[im 27/58  lung]
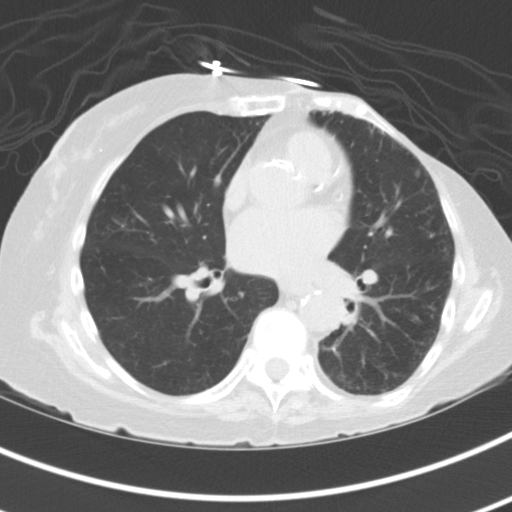
[im 31/58  lung]
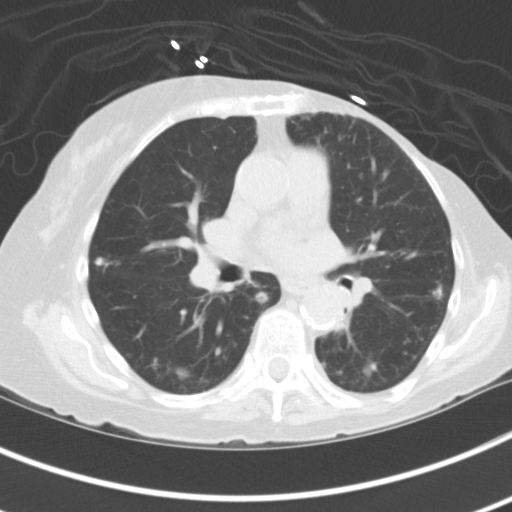
[im 36/58  lung]
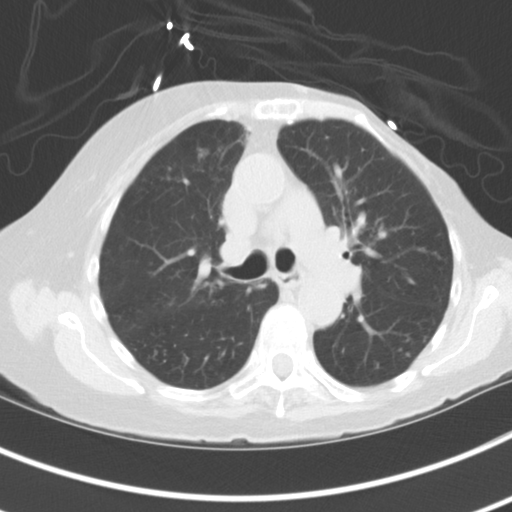
[im 40/58  mediastinal]
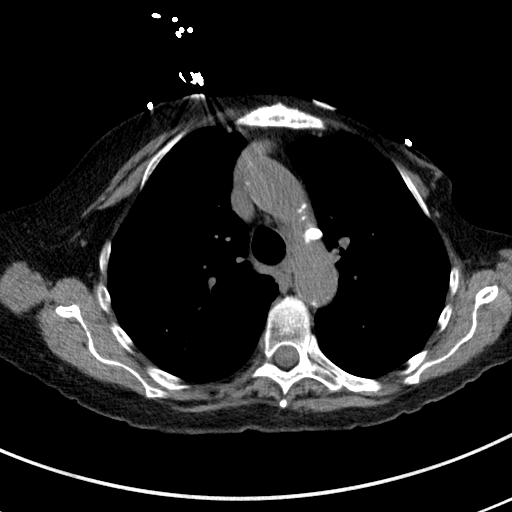
[im 40/58  lung]
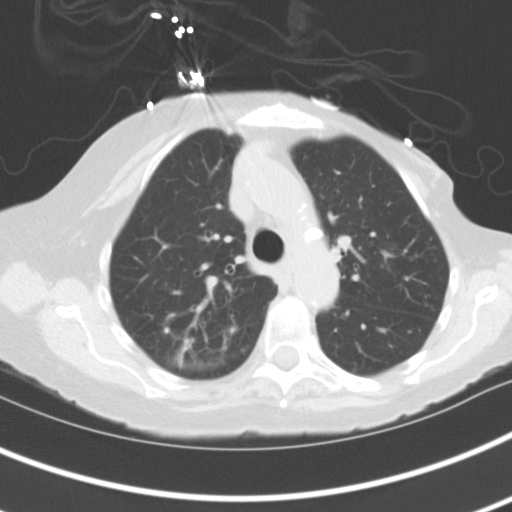
[im 44/58  lung]
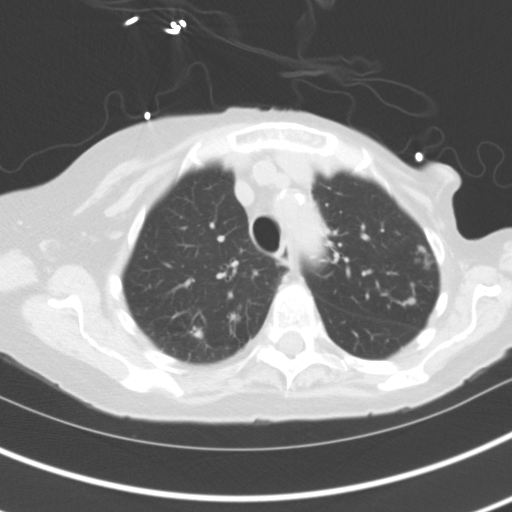
[im 49/58  lung]
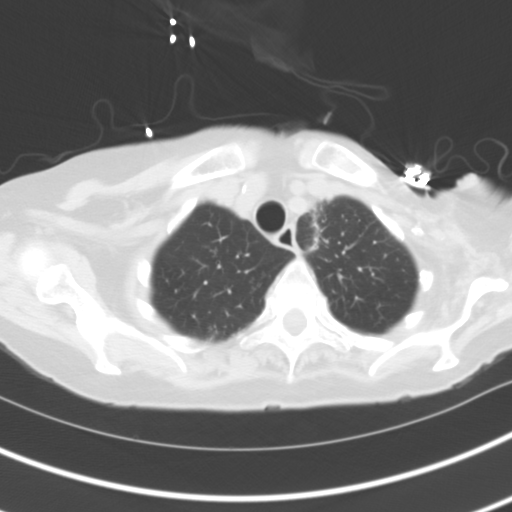
[im 53/58  lung]
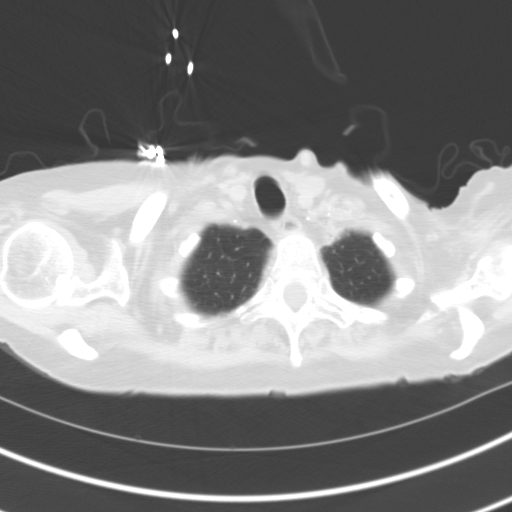

[Series 5: coronal · coronal · 0.59mm/px · 3 of 73 slices shown]
[im 15/73  lung]
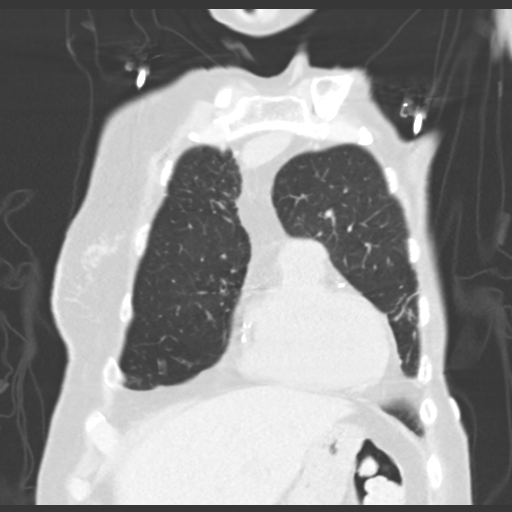
[im 29/73  lung]
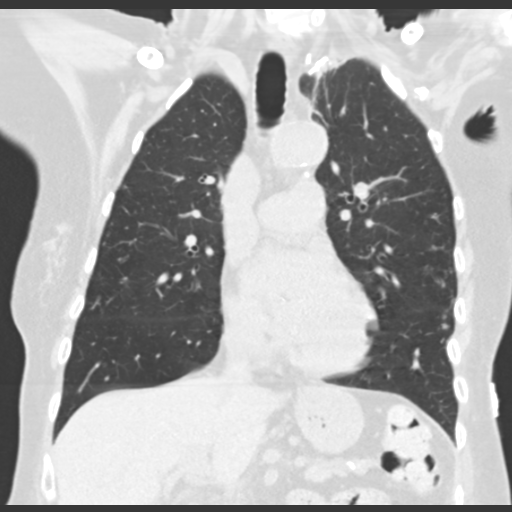
[im 44/73  lung]
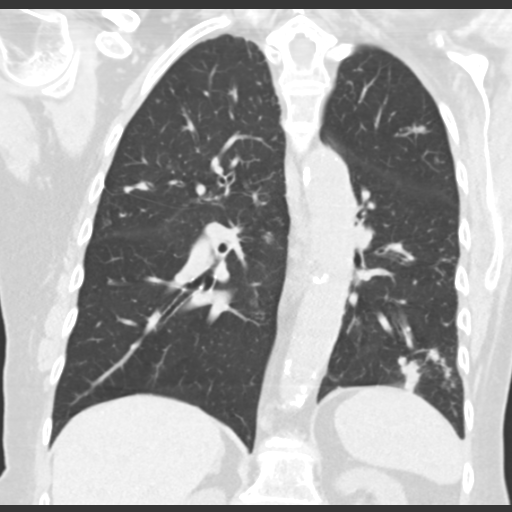

[15 of 36 positions shown; findings below may reference images not displayed]

FINDINGS: Mediastinum: Unremarkable CT appearance of the thyroid gland. No
suspicious mediastinal or hilar adenopathy. No soft tissue
mediastinal mass. Small hiatal hernia.

Heart/Vascular: Limited evaluation in the absence of intravenous
contrast. Three vessel arch anatomy. Elongation of the aortic
isthmus and proximal descending thoracic aorta. Scattered
atherosclerotic vascular calcifications but no evidence of acute
intramural hematoma or aneurysm. Calcifications present along the
aortic valve cusps. The heart is within normal limits for size. No
pericardial effusion. Calcifications present along the course of the
coronary arteries. There is a stent in the proximal right coronary
artery.

Lungs/Pleura: Overall, there has been significant interval
progression of a numerous bilateral pulmonary nodules which appear
largely peribronchovascular in configuration with areas of micro and
macro tree-in-bud nodularity. The regions of greatest increase are
in the lower lobes bilaterally. The previously noted dominant lesion
in the posterior aspect of the right upper lobe has not changed in
size and appears more scar-like on today's examination. On today's
evaluation, the largest conglomerations of macro nodularity are
present in the left lower lobe. If measured in aggregate, the 2
largest examples measure 3.5 x 1.9 cm and 2.8 x 1.3 cm respectively.
Faint band of radiation fibrosis again noted in the anterior left
upper lobe. No evidence of pulmonary edema, pleural effusion or
pneumothorax.

Bones/Soft Tissues: No acute fracture or aggressive appearing lytic
or blastic osseous lesion. Incompletely imaged surgical changes in
the left humerus. Stable benign-appearing sclerotic focus in the
medial head of the left clavicle likely a small enchondroma.
Surgical changes of prior left mastectomy.

Upper Abdomen: Visualized upper abdominal organs are unremarkable.
IMPRESSION: 1. Overall, significant interval progression of diffuse bilateral
peribronchovascular micro and macro nodularity. The regions of
greatest progression are in the lower lobes bilaterally. This
imaging appearance is most suggestive of a progressive indolent
atypical infection such as CIRILO. Multifocal endobronchial spread of
tumor is a less likely consideration. Recommend non emergent
pulmonology consultation.
2. Atherosclerosis including coronary artery calcifications.
3. Aortic valvular calcifications. If there is clinical concern for
underlying aortic valve pathology suggest stenosis or regurgitation
consider further evaluation with echocardiography.
4. Additional ancillary findings as above without significant
interval change.

## 2016-08-18 ENCOUNTER — Ambulatory Visit: Payer: Medicare Other | Admitting: Internal Medicine

## 2016-08-18 DIAGNOSIS — C44311 Basal cell carcinoma of skin of nose: Secondary | ICD-10-CM | POA: Diagnosis not present

## 2016-08-18 DIAGNOSIS — L570 Actinic keratosis: Secondary | ICD-10-CM | POA: Diagnosis not present

## 2016-08-22 DIAGNOSIS — I2510 Atherosclerotic heart disease of native coronary artery without angina pectoris: Secondary | ICD-10-CM | POA: Diagnosis not present

## 2016-08-22 DIAGNOSIS — Z79899 Other long term (current) drug therapy: Secondary | ICD-10-CM | POA: Diagnosis not present

## 2016-08-22 DIAGNOSIS — M0579 Rheumatoid arthritis with rheumatoid factor of multiple sites without organ or systems involvement: Secondary | ICD-10-CM | POA: Diagnosis not present

## 2016-08-22 DIAGNOSIS — M3320 Polymyositis, organ involvement unspecified: Secondary | ICD-10-CM | POA: Diagnosis not present

## 2016-08-22 DIAGNOSIS — D8989 Other specified disorders involving the immune mechanism, not elsewhere classified: Secondary | ICD-10-CM | POA: Diagnosis not present

## 2016-09-19 DIAGNOSIS — R011 Cardiac murmur, unspecified: Secondary | ICD-10-CM | POA: Diagnosis not present

## 2016-10-03 DIAGNOSIS — H353131 Nonexudative age-related macular degeneration, bilateral, early dry stage: Secondary | ICD-10-CM | POA: Diagnosis not present

## 2016-10-03 DIAGNOSIS — H16223 Keratoconjunctivitis sicca, not specified as Sjogren's, bilateral: Secondary | ICD-10-CM | POA: Diagnosis not present

## 2016-10-03 DIAGNOSIS — H35342 Macular cyst, hole, or pseudohole, left eye: Secondary | ICD-10-CM | POA: Diagnosis not present

## 2016-10-03 DIAGNOSIS — H40013 Open angle with borderline findings, low risk, bilateral: Secondary | ICD-10-CM | POA: Diagnosis not present

## 2016-10-20 DIAGNOSIS — L570 Actinic keratosis: Secondary | ICD-10-CM | POA: Diagnosis not present

## 2016-12-12 DIAGNOSIS — Z79899 Other long term (current) drug therapy: Secondary | ICD-10-CM | POA: Diagnosis not present

## 2016-12-12 DIAGNOSIS — M810 Age-related osteoporosis without current pathological fracture: Secondary | ICD-10-CM | POA: Diagnosis not present

## 2016-12-12 DIAGNOSIS — M0579 Rheumatoid arthritis with rheumatoid factor of multiple sites without organ or systems involvement: Secondary | ICD-10-CM | POA: Diagnosis not present

## 2016-12-12 DIAGNOSIS — I2510 Atherosclerotic heart disease of native coronary artery without angina pectoris: Secondary | ICD-10-CM | POA: Diagnosis not present

## 2016-12-12 DIAGNOSIS — M3320 Polymyositis, organ involvement unspecified: Secondary | ICD-10-CM | POA: Diagnosis not present

## 2016-12-13 DIAGNOSIS — R4181 Age-related cognitive decline: Secondary | ICD-10-CM | POA: Diagnosis not present

## 2016-12-13 DIAGNOSIS — M353 Polymyalgia rheumatica: Secondary | ICD-10-CM | POA: Diagnosis not present

## 2016-12-13 DIAGNOSIS — R600 Localized edema: Secondary | ICD-10-CM | POA: Diagnosis not present

## 2016-12-13 DIAGNOSIS — M810 Age-related osteoporosis without current pathological fracture: Secondary | ICD-10-CM | POA: Diagnosis not present

## 2016-12-13 DIAGNOSIS — I25118 Atherosclerotic heart disease of native coronary artery with other forms of angina pectoris: Secondary | ICD-10-CM | POA: Diagnosis not present

## 2016-12-13 DIAGNOSIS — F331 Major depressive disorder, recurrent, moderate: Secondary | ICD-10-CM | POA: Diagnosis not present

## 2016-12-13 DIAGNOSIS — R7309 Other abnormal glucose: Secondary | ICD-10-CM | POA: Diagnosis not present

## 2016-12-13 DIAGNOSIS — L97829 Non-pressure chronic ulcer of other part of left lower leg with unspecified severity: Secondary | ICD-10-CM | POA: Diagnosis not present

## 2016-12-13 DIAGNOSIS — J208 Acute bronchitis due to other specified organisms: Secondary | ICD-10-CM | POA: Diagnosis not present

## 2016-12-13 DIAGNOSIS — J018 Other acute sinusitis: Secondary | ICD-10-CM | POA: Diagnosis not present

## 2016-12-13 DIAGNOSIS — Z6820 Body mass index (BMI) 20.0-20.9, adult: Secondary | ICD-10-CM | POA: Diagnosis not present

## 2016-12-13 DIAGNOSIS — Z1389 Encounter for screening for other disorder: Secondary | ICD-10-CM | POA: Diagnosis not present

## 2016-12-26 DIAGNOSIS — I83009 Varicose veins of unspecified lower extremity with ulcer of unspecified site: Secondary | ICD-10-CM | POA: Diagnosis not present

## 2016-12-26 DIAGNOSIS — L03116 Cellulitis of left lower limb: Secondary | ICD-10-CM | POA: Diagnosis not present

## 2016-12-26 DIAGNOSIS — Z6820 Body mass index (BMI) 20.0-20.9, adult: Secondary | ICD-10-CM | POA: Diagnosis not present

## 2016-12-26 DIAGNOSIS — M3320 Polymyositis, organ involvement unspecified: Secondary | ICD-10-CM | POA: Diagnosis not present

## 2016-12-26 DIAGNOSIS — M810 Age-related osteoporosis without current pathological fracture: Secondary | ICD-10-CM | POA: Diagnosis not present

## 2016-12-26 DIAGNOSIS — R7309 Other abnormal glucose: Secondary | ICD-10-CM | POA: Diagnosis not present

## 2016-12-26 DIAGNOSIS — S91002A Unspecified open wound, left ankle, initial encounter: Secondary | ICD-10-CM | POA: Diagnosis not present

## 2017-01-04 DIAGNOSIS — R443 Hallucinations, unspecified: Secondary | ICD-10-CM | POA: Diagnosis not present

## 2017-01-04 DIAGNOSIS — R296 Repeated falls: Secondary | ICD-10-CM | POA: Diagnosis not present

## 2017-01-04 DIAGNOSIS — Z79899 Other long term (current) drug therapy: Secondary | ICD-10-CM | POA: Diagnosis not present

## 2017-01-04 DIAGNOSIS — Z681 Body mass index (BMI) 19 or less, adult: Secondary | ICD-10-CM | POA: Diagnosis not present

## 2017-01-04 DIAGNOSIS — R600 Localized edema: Secondary | ICD-10-CM | POA: Diagnosis not present

## 2017-01-04 DIAGNOSIS — M353 Polymyalgia rheumatica: Secondary | ICD-10-CM | POA: Diagnosis not present

## 2017-01-04 DIAGNOSIS — Z1389 Encounter for screening for other disorder: Secondary | ICD-10-CM | POA: Diagnosis not present

## 2017-01-10 DIAGNOSIS — L959 Vasculitis limited to the skin, unspecified: Secondary | ICD-10-CM | POA: Diagnosis not present

## 2017-01-10 DIAGNOSIS — M25572 Pain in left ankle and joints of left foot: Secondary | ICD-10-CM | POA: Diagnosis not present

## 2017-01-10 DIAGNOSIS — E11622 Type 2 diabetes mellitus with other skin ulcer: Secondary | ICD-10-CM | POA: Diagnosis not present

## 2017-01-10 DIAGNOSIS — E785 Hyperlipidemia, unspecified: Secondary | ICD-10-CM | POA: Diagnosis not present

## 2017-01-10 DIAGNOSIS — L97321 Non-pressure chronic ulcer of left ankle limited to breakdown of skin: Secondary | ICD-10-CM | POA: Diagnosis not present

## 2017-01-10 DIAGNOSIS — E1165 Type 2 diabetes mellitus with hyperglycemia: Secondary | ICD-10-CM | POA: Diagnosis not present

## 2017-01-10 DIAGNOSIS — Z7984 Long term (current) use of oral hypoglycemic drugs: Secondary | ICD-10-CM | POA: Diagnosis not present

## 2017-01-19 DIAGNOSIS — E1165 Type 2 diabetes mellitus with hyperglycemia: Secondary | ICD-10-CM | POA: Diagnosis not present

## 2017-01-19 DIAGNOSIS — E785 Hyperlipidemia, unspecified: Secondary | ICD-10-CM | POA: Diagnosis not present

## 2017-01-19 DIAGNOSIS — L959 Vasculitis limited to the skin, unspecified: Secondary | ICD-10-CM | POA: Diagnosis not present

## 2017-01-19 DIAGNOSIS — L97321 Non-pressure chronic ulcer of left ankle limited to breakdown of skin: Secondary | ICD-10-CM | POA: Diagnosis not present

## 2017-01-19 DIAGNOSIS — E11622 Type 2 diabetes mellitus with other skin ulcer: Secondary | ICD-10-CM | POA: Diagnosis not present

## 2017-01-19 DIAGNOSIS — Z7984 Long term (current) use of oral hypoglycemic drugs: Secondary | ICD-10-CM | POA: Diagnosis not present

## 2017-01-23 DIAGNOSIS — Z7984 Long term (current) use of oral hypoglycemic drugs: Secondary | ICD-10-CM | POA: Diagnosis not present

## 2017-01-23 DIAGNOSIS — E785 Hyperlipidemia, unspecified: Secondary | ICD-10-CM | POA: Diagnosis not present

## 2017-01-23 DIAGNOSIS — R0989 Other specified symptoms and signs involving the circulatory and respiratory systems: Secondary | ICD-10-CM | POA: Diagnosis not present

## 2017-01-23 DIAGNOSIS — E11622 Type 2 diabetes mellitus with other skin ulcer: Secondary | ICD-10-CM | POA: Diagnosis not present

## 2017-01-23 DIAGNOSIS — E1165 Type 2 diabetes mellitus with hyperglycemia: Secondary | ICD-10-CM | POA: Diagnosis not present

## 2017-01-23 DIAGNOSIS — L97321 Non-pressure chronic ulcer of left ankle limited to breakdown of skin: Secondary | ICD-10-CM | POA: Diagnosis not present

## 2017-01-23 DIAGNOSIS — L959 Vasculitis limited to the skin, unspecified: Secondary | ICD-10-CM | POA: Diagnosis not present

## 2017-01-27 DIAGNOSIS — E1165 Type 2 diabetes mellitus with hyperglycemia: Secondary | ICD-10-CM | POA: Diagnosis not present

## 2017-01-27 DIAGNOSIS — E785 Hyperlipidemia, unspecified: Secondary | ICD-10-CM | POA: Diagnosis not present

## 2017-01-27 DIAGNOSIS — L97321 Non-pressure chronic ulcer of left ankle limited to breakdown of skin: Secondary | ICD-10-CM | POA: Diagnosis not present

## 2017-01-27 DIAGNOSIS — Z7984 Long term (current) use of oral hypoglycemic drugs: Secondary | ICD-10-CM | POA: Diagnosis not present

## 2017-01-27 DIAGNOSIS — L959 Vasculitis limited to the skin, unspecified: Secondary | ICD-10-CM | POA: Diagnosis not present

## 2017-01-27 DIAGNOSIS — E11622 Type 2 diabetes mellitus with other skin ulcer: Secondary | ICD-10-CM | POA: Diagnosis not present

## 2017-02-01 DIAGNOSIS — E11622 Type 2 diabetes mellitus with other skin ulcer: Secondary | ICD-10-CM | POA: Diagnosis not present

## 2017-02-01 DIAGNOSIS — L959 Vasculitis limited to the skin, unspecified: Secondary | ICD-10-CM | POA: Diagnosis not present

## 2017-02-01 DIAGNOSIS — Z7984 Long term (current) use of oral hypoglycemic drugs: Secondary | ICD-10-CM | POA: Diagnosis not present

## 2017-02-01 DIAGNOSIS — E1165 Type 2 diabetes mellitus with hyperglycemia: Secondary | ICD-10-CM | POA: Diagnosis not present

## 2017-02-01 DIAGNOSIS — L97321 Non-pressure chronic ulcer of left ankle limited to breakdown of skin: Secondary | ICD-10-CM | POA: Diagnosis not present

## 2017-02-01 DIAGNOSIS — E785 Hyperlipidemia, unspecified: Secondary | ICD-10-CM | POA: Diagnosis not present

## 2017-02-03 DIAGNOSIS — Z7984 Long term (current) use of oral hypoglycemic drugs: Secondary | ICD-10-CM | POA: Diagnosis not present

## 2017-02-03 DIAGNOSIS — E11622 Type 2 diabetes mellitus with other skin ulcer: Secondary | ICD-10-CM | POA: Diagnosis not present

## 2017-02-03 DIAGNOSIS — E785 Hyperlipidemia, unspecified: Secondary | ICD-10-CM | POA: Diagnosis not present

## 2017-02-03 DIAGNOSIS — L959 Vasculitis limited to the skin, unspecified: Secondary | ICD-10-CM | POA: Diagnosis not present

## 2017-02-03 DIAGNOSIS — E1165 Type 2 diabetes mellitus with hyperglycemia: Secondary | ICD-10-CM | POA: Diagnosis not present

## 2017-02-03 DIAGNOSIS — L97321 Non-pressure chronic ulcer of left ankle limited to breakdown of skin: Secondary | ICD-10-CM | POA: Diagnosis not present

## 2017-02-06 DIAGNOSIS — L97321 Non-pressure chronic ulcer of left ankle limited to breakdown of skin: Secondary | ICD-10-CM | POA: Diagnosis not present

## 2017-02-06 DIAGNOSIS — L959 Vasculitis limited to the skin, unspecified: Secondary | ICD-10-CM | POA: Diagnosis not present

## 2017-02-06 DIAGNOSIS — Z7984 Long term (current) use of oral hypoglycemic drugs: Secondary | ICD-10-CM | POA: Diagnosis not present

## 2017-02-06 DIAGNOSIS — E11622 Type 2 diabetes mellitus with other skin ulcer: Secondary | ICD-10-CM | POA: Diagnosis not present

## 2017-02-06 DIAGNOSIS — E785 Hyperlipidemia, unspecified: Secondary | ICD-10-CM | POA: Diagnosis not present

## 2017-02-06 DIAGNOSIS — E1165 Type 2 diabetes mellitus with hyperglycemia: Secondary | ICD-10-CM | POA: Diagnosis not present

## 2017-02-09 DIAGNOSIS — I2510 Atherosclerotic heart disease of native coronary artery without angina pectoris: Secondary | ICD-10-CM | POA: Diagnosis not present

## 2017-02-09 DIAGNOSIS — M3320 Polymyositis, organ involvement unspecified: Secondary | ICD-10-CM | POA: Diagnosis not present

## 2017-02-16 DIAGNOSIS — Z79899 Other long term (current) drug therapy: Secondary | ICD-10-CM | POA: Diagnosis not present

## 2017-02-16 DIAGNOSIS — E782 Mixed hyperlipidemia: Secondary | ICD-10-CM | POA: Diagnosis not present

## 2017-02-16 DIAGNOSIS — Z681 Body mass index (BMI) 19 or less, adult: Secondary | ICD-10-CM | POA: Diagnosis not present

## 2017-02-16 DIAGNOSIS — I10 Essential (primary) hypertension: Secondary | ICD-10-CM | POA: Diagnosis not present

## 2017-02-17 DIAGNOSIS — L97321 Non-pressure chronic ulcer of left ankle limited to breakdown of skin: Secondary | ICD-10-CM | POA: Diagnosis not present

## 2017-02-17 DIAGNOSIS — M25572 Pain in left ankle and joints of left foot: Secondary | ICD-10-CM | POA: Diagnosis not present

## 2017-02-17 DIAGNOSIS — E1165 Type 2 diabetes mellitus with hyperglycemia: Secondary | ICD-10-CM | POA: Diagnosis not present

## 2017-02-17 DIAGNOSIS — E785 Hyperlipidemia, unspecified: Secondary | ICD-10-CM | POA: Diagnosis not present

## 2017-02-17 DIAGNOSIS — L959 Vasculitis limited to the skin, unspecified: Secondary | ICD-10-CM | POA: Diagnosis not present

## 2017-02-17 DIAGNOSIS — Z7984 Long term (current) use of oral hypoglycemic drugs: Secondary | ICD-10-CM | POA: Diagnosis not present

## 2017-02-17 DIAGNOSIS — Z87898 Personal history of other specified conditions: Secondary | ICD-10-CM | POA: Diagnosis not present

## 2017-02-17 DIAGNOSIS — Z09 Encounter for follow-up examination after completed treatment for conditions other than malignant neoplasm: Secondary | ICD-10-CM | POA: Diagnosis not present

## 2017-02-27 DIAGNOSIS — Z79899 Other long term (current) drug therapy: Secondary | ICD-10-CM | POA: Diagnosis not present

## 2017-02-27 DIAGNOSIS — M0579 Rheumatoid arthritis with rheumatoid factor of multiple sites without organ or systems involvement: Secondary | ICD-10-CM | POA: Diagnosis not present

## 2017-02-27 DIAGNOSIS — M810 Age-related osteoporosis without current pathological fracture: Secondary | ICD-10-CM | POA: Diagnosis not present

## 2017-02-27 DIAGNOSIS — M3320 Polymyositis, organ involvement unspecified: Secondary | ICD-10-CM | POA: Diagnosis not present

## 2017-02-27 DIAGNOSIS — I2510 Atherosclerotic heart disease of native coronary artery without angina pectoris: Secondary | ICD-10-CM | POA: Diagnosis not present

## 2017-03-01 DIAGNOSIS — I2510 Atherosclerotic heart disease of native coronary artery without angina pectoris: Secondary | ICD-10-CM | POA: Diagnosis not present

## 2017-03-01 DIAGNOSIS — M3320 Polymyositis, organ involvement unspecified: Secondary | ICD-10-CM | POA: Diagnosis not present

## 2017-03-09 DIAGNOSIS — E785 Hyperlipidemia, unspecified: Secondary | ICD-10-CM | POA: Diagnosis not present

## 2017-03-09 DIAGNOSIS — L959 Vasculitis limited to the skin, unspecified: Secondary | ICD-10-CM | POA: Diagnosis not present

## 2017-03-09 DIAGNOSIS — E11622 Type 2 diabetes mellitus with other skin ulcer: Secondary | ICD-10-CM | POA: Diagnosis not present

## 2017-03-09 DIAGNOSIS — L97329 Non-pressure chronic ulcer of left ankle with unspecified severity: Secondary | ICD-10-CM | POA: Diagnosis not present

## 2017-03-09 DIAGNOSIS — I87332 Chronic venous hypertension (idiopathic) with ulcer and inflammation of left lower extremity: Secondary | ICD-10-CM | POA: Diagnosis not present

## 2017-03-09 DIAGNOSIS — M25572 Pain in left ankle and joints of left foot: Secondary | ICD-10-CM | POA: Diagnosis not present

## 2017-03-09 DIAGNOSIS — Z7984 Long term (current) use of oral hypoglycemic drugs: Secondary | ICD-10-CM | POA: Diagnosis not present

## 2017-03-09 DIAGNOSIS — E1165 Type 2 diabetes mellitus with hyperglycemia: Secondary | ICD-10-CM | POA: Diagnosis not present

## 2017-03-10 DIAGNOSIS — K219 Gastro-esophageal reflux disease without esophagitis: Secondary | ICD-10-CM | POA: Diagnosis not present

## 2017-03-10 DIAGNOSIS — M25572 Pain in left ankle and joints of left foot: Secondary | ICD-10-CM | POA: Diagnosis not present

## 2017-03-10 DIAGNOSIS — I350 Nonrheumatic aortic (valve) stenosis: Secondary | ICD-10-CM | POA: Diagnosis not present

## 2017-03-10 DIAGNOSIS — I87322 Chronic venous hypertension (idiopathic) with inflammation of left lower extremity: Secondary | ICD-10-CM | POA: Diagnosis not present

## 2017-03-10 DIAGNOSIS — Z7984 Long term (current) use of oral hypoglycemic drugs: Secondary | ICD-10-CM | POA: Diagnosis not present

## 2017-03-10 DIAGNOSIS — L97321 Non-pressure chronic ulcer of left ankle limited to breakdown of skin: Secondary | ICD-10-CM | POA: Diagnosis not present

## 2017-03-10 DIAGNOSIS — Z7902 Long term (current) use of antithrombotics/antiplatelets: Secondary | ICD-10-CM | POA: Diagnosis not present

## 2017-03-10 DIAGNOSIS — Z7982 Long term (current) use of aspirin: Secondary | ICD-10-CM | POA: Diagnosis not present

## 2017-03-10 DIAGNOSIS — R600 Localized edema: Secondary | ICD-10-CM | POA: Diagnosis not present

## 2017-03-10 DIAGNOSIS — I10 Essential (primary) hypertension: Secondary | ICD-10-CM | POA: Diagnosis not present

## 2017-03-10 DIAGNOSIS — R0989 Other specified symptoms and signs involving the circulatory and respiratory systems: Secondary | ICD-10-CM | POA: Diagnosis not present

## 2017-03-10 DIAGNOSIS — I779 Disorder of arteries and arterioles, unspecified: Secondary | ICD-10-CM | POA: Diagnosis not present

## 2017-03-10 DIAGNOSIS — E11622 Type 2 diabetes mellitus with other skin ulcer: Secondary | ICD-10-CM | POA: Diagnosis not present

## 2017-03-10 DIAGNOSIS — E1165 Type 2 diabetes mellitus with hyperglycemia: Secondary | ICD-10-CM | POA: Diagnosis not present

## 2017-03-10 DIAGNOSIS — T148XXA Other injury of unspecified body region, initial encounter: Secondary | ICD-10-CM | POA: Diagnosis not present

## 2017-03-10 DIAGNOSIS — E119 Type 2 diabetes mellitus without complications: Secondary | ICD-10-CM | POA: Diagnosis not present

## 2017-03-10 DIAGNOSIS — E782 Mixed hyperlipidemia: Secondary | ICD-10-CM | POA: Diagnosis not present

## 2017-03-10 DIAGNOSIS — I2510 Atherosclerotic heart disease of native coronary artery without angina pectoris: Secondary | ICD-10-CM | POA: Diagnosis not present

## 2017-03-10 DIAGNOSIS — E785 Hyperlipidemia, unspecified: Secondary | ICD-10-CM | POA: Diagnosis not present

## 2017-03-10 DIAGNOSIS — L959 Vasculitis limited to the skin, unspecified: Secondary | ICD-10-CM | POA: Diagnosis not present

## 2017-03-13 DIAGNOSIS — I10 Essential (primary) hypertension: Secondary | ICD-10-CM | POA: Diagnosis not present

## 2017-03-13 DIAGNOSIS — I87322 Chronic venous hypertension (idiopathic) with inflammation of left lower extremity: Secondary | ICD-10-CM | POA: Diagnosis not present

## 2017-03-13 DIAGNOSIS — E11622 Type 2 diabetes mellitus with other skin ulcer: Secondary | ICD-10-CM | POA: Diagnosis not present

## 2017-03-13 DIAGNOSIS — E1165 Type 2 diabetes mellitus with hyperglycemia: Secondary | ICD-10-CM | POA: Diagnosis not present

## 2017-03-13 DIAGNOSIS — L97321 Non-pressure chronic ulcer of left ankle limited to breakdown of skin: Secondary | ICD-10-CM | POA: Diagnosis not present

## 2017-03-13 DIAGNOSIS — I2510 Atherosclerotic heart disease of native coronary artery without angina pectoris: Secondary | ICD-10-CM | POA: Diagnosis not present

## 2017-03-16 DIAGNOSIS — E11622 Type 2 diabetes mellitus with other skin ulcer: Secondary | ICD-10-CM | POA: Diagnosis not present

## 2017-03-16 DIAGNOSIS — Z09 Encounter for follow-up examination after completed treatment for conditions other than malignant neoplasm: Secondary | ICD-10-CM | POA: Diagnosis not present

## 2017-03-16 DIAGNOSIS — Z87898 Personal history of other specified conditions: Secondary | ICD-10-CM | POA: Diagnosis not present

## 2017-03-16 DIAGNOSIS — L959 Vasculitis limited to the skin, unspecified: Secondary | ICD-10-CM | POA: Diagnosis not present

## 2017-03-16 DIAGNOSIS — E1165 Type 2 diabetes mellitus with hyperglycemia: Secondary | ICD-10-CM | POA: Diagnosis not present

## 2017-03-16 DIAGNOSIS — E785 Hyperlipidemia, unspecified: Secondary | ICD-10-CM | POA: Diagnosis not present

## 2017-03-16 DIAGNOSIS — M25572 Pain in left ankle and joints of left foot: Secondary | ICD-10-CM | POA: Diagnosis not present

## 2017-03-16 DIAGNOSIS — Z7984 Long term (current) use of oral hypoglycemic drugs: Secondary | ICD-10-CM | POA: Diagnosis not present

## 2017-03-17 DIAGNOSIS — R0989 Other specified symptoms and signs involving the circulatory and respiratory systems: Secondary | ICD-10-CM | POA: Diagnosis not present

## 2017-03-17 DIAGNOSIS — T148XXA Other injury of unspecified body region, initial encounter: Secondary | ICD-10-CM | POA: Diagnosis not present

## 2017-03-24 DIAGNOSIS — I2510 Atherosclerotic heart disease of native coronary artery without angina pectoris: Secondary | ICD-10-CM | POA: Diagnosis not present

## 2017-03-24 DIAGNOSIS — T148XXA Other injury of unspecified body region, initial encounter: Secondary | ICD-10-CM | POA: Diagnosis not present

## 2017-03-24 DIAGNOSIS — I350 Nonrheumatic aortic (valve) stenosis: Secondary | ICD-10-CM | POA: Diagnosis not present

## 2017-03-24 DIAGNOSIS — E782 Mixed hyperlipidemia: Secondary | ICD-10-CM | POA: Diagnosis not present

## 2017-03-24 DIAGNOSIS — I779 Disorder of arteries and arterioles, unspecified: Secondary | ICD-10-CM | POA: Diagnosis not present

## 2017-03-24 DIAGNOSIS — R0989 Other specified symptoms and signs involving the circulatory and respiratory systems: Secondary | ICD-10-CM | POA: Diagnosis not present

## 2017-03-24 DIAGNOSIS — E119 Type 2 diabetes mellitus without complications: Secondary | ICD-10-CM | POA: Diagnosis not present

## 2017-03-24 DIAGNOSIS — I10 Essential (primary) hypertension: Secondary | ICD-10-CM | POA: Diagnosis not present

## 2017-03-30 DIAGNOSIS — M79672 Pain in left foot: Secondary | ICD-10-CM | POA: Diagnosis not present

## 2017-03-30 DIAGNOSIS — B070 Plantar wart: Secondary | ICD-10-CM | POA: Diagnosis not present

## 2017-03-31 DIAGNOSIS — M3320 Polymyositis, organ involvement unspecified: Secondary | ICD-10-CM | POA: Diagnosis not present

## 2017-03-31 DIAGNOSIS — I2510 Atherosclerotic heart disease of native coronary artery without angina pectoris: Secondary | ICD-10-CM | POA: Diagnosis not present

## 2017-04-24 DIAGNOSIS — B070 Plantar wart: Secondary | ICD-10-CM | POA: Diagnosis not present

## 2017-04-26 DIAGNOSIS — M3320 Polymyositis, organ involvement unspecified: Secondary | ICD-10-CM | POA: Diagnosis not present

## 2017-04-26 DIAGNOSIS — I2510 Atherosclerotic heart disease of native coronary artery without angina pectoris: Secondary | ICD-10-CM | POA: Diagnosis not present

## 2017-05-17 DIAGNOSIS — B070 Plantar wart: Secondary | ICD-10-CM | POA: Diagnosis not present

## 2017-05-29 DIAGNOSIS — I2510 Atherosclerotic heart disease of native coronary artery without angina pectoris: Secondary | ICD-10-CM | POA: Diagnosis not present

## 2017-05-29 DIAGNOSIS — M3320 Polymyositis, organ involvement unspecified: Secondary | ICD-10-CM | POA: Diagnosis not present

## 2017-06-12 DIAGNOSIS — L851 Acquired keratosis [keratoderma] palmaris et plantaris: Secondary | ICD-10-CM | POA: Diagnosis not present

## 2017-06-12 DIAGNOSIS — B070 Plantar wart: Secondary | ICD-10-CM | POA: Diagnosis not present

## 2017-06-12 DIAGNOSIS — Z23 Encounter for immunization: Secondary | ICD-10-CM | POA: Diagnosis not present

## 2017-06-14 DIAGNOSIS — D485 Neoplasm of uncertain behavior of skin: Secondary | ICD-10-CM | POA: Diagnosis not present

## 2017-06-14 DIAGNOSIS — L570 Actinic keratosis: Secondary | ICD-10-CM | POA: Diagnosis not present

## 2017-06-14 DIAGNOSIS — L814 Other melanin hyperpigmentation: Secondary | ICD-10-CM | POA: Diagnosis not present

## 2017-06-22 DIAGNOSIS — M79606 Pain in leg, unspecified: Secondary | ICD-10-CM | POA: Diagnosis not present

## 2017-06-22 DIAGNOSIS — Z681 Body mass index (BMI) 19 or less, adult: Secondary | ICD-10-CM | POA: Diagnosis not present

## 2017-06-22 DIAGNOSIS — Z79899 Other long term (current) drug therapy: Secondary | ICD-10-CM | POA: Diagnosis not present

## 2017-06-22 DIAGNOSIS — M79609 Pain in unspecified limb: Secondary | ICD-10-CM | POA: Diagnosis not present

## 2017-06-22 DIAGNOSIS — I10 Essential (primary) hypertension: Secondary | ICD-10-CM | POA: Diagnosis not present

## 2017-06-22 DIAGNOSIS — R7301 Impaired fasting glucose: Secondary | ICD-10-CM | POA: Diagnosis not present

## 2017-06-22 DIAGNOSIS — R197 Diarrhea, unspecified: Secondary | ICD-10-CM | POA: Diagnosis not present

## 2017-06-22 DIAGNOSIS — R4181 Age-related cognitive decline: Secondary | ICD-10-CM | POA: Diagnosis not present

## 2017-06-22 DIAGNOSIS — E782 Mixed hyperlipidemia: Secondary | ICD-10-CM | POA: Diagnosis not present

## 2017-06-27 DIAGNOSIS — I2510 Atherosclerotic heart disease of native coronary artery without angina pectoris: Secondary | ICD-10-CM | POA: Diagnosis not present

## 2017-06-27 DIAGNOSIS — M3320 Polymyositis, organ involvement unspecified: Secondary | ICD-10-CM | POA: Diagnosis not present

## 2019-05-26 DEATH — deceased
